# Patient Record
Sex: Male | Born: 1940 | Race: White | Hispanic: No | Smoking: Former smoker
Health system: Southern US, Community
[De-identification: ages and names within clinical notes are randomized; demographics above are authoritative.]

## PROBLEM LIST (undated history)

## (undated) DIAGNOSIS — I10 Essential (primary) hypertension: Secondary | ICD-10-CM

## (undated) DIAGNOSIS — I2699 Other pulmonary embolism without acute cor pulmonale: Secondary | ICD-10-CM

## (undated) DIAGNOSIS — G459 Transient cerebral ischemic attack, unspecified: Secondary | ICD-10-CM

## (undated) DIAGNOSIS — I82409 Acute embolism and thrombosis of unspecified deep veins of unspecified lower extremity: Secondary | ICD-10-CM

## (undated) DIAGNOSIS — N529 Male erectile dysfunction, unspecified: Secondary | ICD-10-CM

## (undated) HISTORY — PX: TONSILLECTOMY: SUR1361

## (undated) HISTORY — DX: Other pulmonary embolism without acute cor pulmonale: I26.99

## (undated) HISTORY — DX: Male erectile dysfunction, unspecified: N52.9

## (undated) HISTORY — PX: CATARACT EXTRACTION W/ INTRAOCULAR LENS  IMPLANT, BILATERAL: SHX1307

## (undated) HISTORY — PX: COLONOSCOPY: SHX174

## (undated) HISTORY — PX: APPENDECTOMY: SHX54

## (undated) HISTORY — DX: Essential (primary) hypertension: I10

---

## 2004-11-05 ENCOUNTER — Ambulatory Visit: Payer: Self-pay | Admitting: Family Medicine

## 2005-05-27 ENCOUNTER — Ambulatory Visit: Payer: Self-pay | Admitting: Family Medicine

## 2005-07-16 ENCOUNTER — Ambulatory Visit: Payer: Self-pay | Admitting: Family Medicine

## 2006-07-06 ENCOUNTER — Ambulatory Visit: Payer: Self-pay | Admitting: Family Medicine

## 2006-07-06 LAB — CONVERTED CEMR LAB
ALT: 23 units/L (ref 0–40)
AST: 21 units/L (ref 0–37)
Alkaline Phosphatase: 57 units/L (ref 39–117)
BUN: 16 mg/dL (ref 6–23)
Calcium: 9.8 mg/dL (ref 8.4–10.5)
HCT: 46 % (ref 39.0–52.0)
HDL: 57.3 mg/dL (ref >39.0)
Hemoglobin: 15.5 g/dL (ref 13.0–17.0)
MCHC: 33.6 g/dL (ref 30.0–36.0)
Monocytes Absolute: 0.9 10*3/uL — ABNORMAL HIGH (ref 0.2–0.7)
Neutro Abs: 4.8 10*3/uL (ref 1.4–7.7)
Platelets: 261 10*3/uL (ref 150–400)
RBC: 4.76 M/uL (ref 4.22–5.81)
Sodium: 143 meq/L (ref 135–145)
VLDL: 30 mg/dL (ref 0–40)
WBC: 7.8 10*3/uL (ref 4.5–10.5)

## 2006-11-18 ENCOUNTER — Ambulatory Visit: Payer: Self-pay | Admitting: Internal Medicine

## 2006-11-26 ENCOUNTER — Encounter: Payer: Self-pay | Admitting: Internal Medicine

## 2006-11-26 ENCOUNTER — Ambulatory Visit: Payer: Self-pay | Admitting: Internal Medicine

## 2006-12-09 ENCOUNTER — Ambulatory Visit: Payer: Self-pay | Admitting: Family Medicine

## 2008-03-16 ENCOUNTER — Telehealth: Payer: Self-pay | Admitting: Family Medicine

## 2008-03-23 ENCOUNTER — Encounter: Payer: Self-pay | Admitting: Family Medicine

## 2008-03-24 ENCOUNTER — Ambulatory Visit: Payer: Self-pay | Admitting: Family Medicine

## 2008-03-24 DIAGNOSIS — R5381 Other malaise: Secondary | ICD-10-CM | POA: Insufficient documentation

## 2008-03-24 DIAGNOSIS — F528 Other sexual dysfunction not due to a substance or known physiological condition: Secondary | ICD-10-CM | POA: Insufficient documentation

## 2008-03-24 DIAGNOSIS — R5383 Other fatigue: Secondary | ICD-10-CM

## 2008-03-24 DIAGNOSIS — I10 Essential (primary) hypertension: Secondary | ICD-10-CM | POA: Insufficient documentation

## 2008-03-24 LAB — CONVERTED CEMR LAB
AST: 21 units/L (ref 0–37)
Albumin: 4.2 g/dL (ref 3.5–5.2)
Alkaline Phosphatase: 62 units/L (ref 39–117)
BUN: 14 mg/dL (ref 6–23)
Bilirubin Urine: NEGATIVE
Blood in Urine, dipstick: NEGATIVE
CO2: 25 meq/L (ref 19–32)
Calcium: 9.3 mg/dL (ref 8.4–10.5)
Chloride: 114 meq/L — ABNORMAL HIGH (ref 96–112)
Cholesterol: 170 mg/dL (ref 0–200)
Creatinine, Ser: 1.1 mg/dL (ref 0.4–1.5)
GFR calc Af Amer: 86 mL/min
Glucose, Bld: 102 mg/dL — ABNORMAL HIGH (ref 70–99)
HDL: 41.8 mg/dL (ref >39.0)
Ketones, urine, test strip: NEGATIVE
MCHC: 34.3 g/dL (ref 30.0–36.0)
MCV: 97.4 fL (ref 78.0–100.0)
Neutrophils Relative %: 61.7 % (ref 43.0–77.0)
Platelets: 247 10*3/uL (ref 150–400)
Protein, U semiquant: NEGATIVE
RDW: 12.3 % (ref 11.5–14.6)
Specific Gravity, Urine: 1.015
Total Bilirubin: 1.7 mg/dL — ABNORMAL HIGH (ref 0.3–1.2)
Triglycerides: 82 mg/dL (ref 0–149)

## 2008-03-29 ENCOUNTER — Telehealth: Payer: Self-pay | Admitting: *Deleted

## 2008-03-30 ENCOUNTER — Ambulatory Visit: Payer: Self-pay | Admitting: Family Medicine

## 2008-05-18 ENCOUNTER — Telehealth: Payer: Self-pay | Admitting: *Deleted

## 2008-08-09 DIAGNOSIS — M25539 Pain in unspecified wrist: Secondary | ICD-10-CM | POA: Insufficient documentation

## 2008-08-10 ENCOUNTER — Ambulatory Visit: Payer: Self-pay | Admitting: Family Medicine

## 2008-11-30 ENCOUNTER — Telehealth: Payer: Self-pay | Admitting: Family Medicine

## 2009-01-22 ENCOUNTER — Ambulatory Visit: Payer: Self-pay | Admitting: Family Medicine

## 2009-01-22 LAB — CONVERTED CEMR LAB
Basophils Relative: 0.6 % (ref 0.0–3.0)
Bilirubin Urine: NEGATIVE
CO2: 30 meq/L (ref 19–32)
Calcium: 9.1 mg/dL (ref 8.4–10.5)
Chloride: 103 meq/L (ref 96–112)
GFR calc non Af Amer: 42.84 mL/min (ref >60)
Glucose, Urine, Semiquant: NEGATIVE
Hemoglobin: 15.6 g/dL (ref 13.0–17.0)
Lymphocytes Relative: 26.8 % (ref 12.0–46.0)
MCHC: 33.8 g/dL (ref 30.0–36.0)
Monocytes Absolute: 0.9 10*3/uL (ref 0.1–1.0)
Neutro Abs: 5.5 10*3/uL (ref 1.4–7.7)
Potassium: 4.7 meq/L (ref 3.5–5.1)
Protein, U semiquant: NEGATIVE
RBC: 4.77 M/uL (ref 4.22–5.81)
Urobilinogen, UA: 0.2
WBC Urine, dipstick: NEGATIVE
WBC: 9.3 10*3/uL (ref 4.5–10.5)

## 2009-01-23 ENCOUNTER — Telehealth: Payer: Self-pay | Admitting: Family Medicine

## 2009-02-13 ENCOUNTER — Ambulatory Visit: Payer: Self-pay | Admitting: Family Medicine

## 2009-02-14 LAB — CONVERTED CEMR LAB
ALT: 25 units/L (ref 0–53)
AST: 22 units/L (ref 0–37)
Albumin: 4.1 g/dL (ref 3.5–5.2)
Alkaline Phosphatase: 57 units/L (ref 39–117)
BUN: 15 mg/dL (ref 6–23)
Basophils Absolute: 0 10*3/uL (ref 0.0–0.1)
Chloride: 111 meq/L (ref 96–112)
Eosinophils Absolute: 0.2 10*3/uL (ref 0.0–0.7)
GFR calc non Af Amer: 79.01 mL/min (ref >60)
Glucose, Bld: 93 mg/dL (ref 70–99)
HCT: 43.6 % (ref 39.0–52.0)
Lymphocytes Relative: 27.6 % (ref 12.0–46.0)
Monocytes Relative: 9.5 % (ref 3.0–12.0)
Neutro Abs: 4.7 10*3/uL (ref 1.4–7.7)
Potassium: 4.4 meq/L (ref 3.5–5.1)
RDW: 12.3 % (ref 11.5–14.6)
Sodium: 144 meq/L (ref 135–145)
TSH: 2.52 microintl units/mL (ref 0.35–5.50)
Total Protein: 7.2 g/dL (ref 6.0–8.3)

## 2009-04-16 ENCOUNTER — Telehealth: Payer: Self-pay | Admitting: Family Medicine

## 2009-04-17 ENCOUNTER — Ambulatory Visit: Payer: Self-pay | Admitting: Family Medicine

## 2009-04-19 ENCOUNTER — Ambulatory Visit: Payer: Self-pay | Admitting: Family Medicine

## 2009-04-19 LAB — CONVERTED CEMR LAB
Alkaline Phosphatase: 56 units/L (ref 39–117)
Basophils Absolute: 0.1 10*3/uL (ref 0.0–0.1)
Basophils Relative: 0.7 % (ref 0.0–3.0)
Blood in Urine, dipstick: NEGATIVE
CO2: 28 meq/L (ref 19–32)
Eosinophils Relative: 2.6 % (ref 0.0–5.0)
Folate: 4.9 ng/mL
Glucose, Bld: 94 mg/dL (ref 70–99)
HDL: 53.3 mg/dL (ref >39.00)
Iron: 100 ug/dL (ref 42–165)
Ketones, urine, test strip: NEGATIVE
LDL Cholesterol: 110 mg/dL — ABNORMAL HIGH (ref 0–99)
Lymphocytes Relative: 27.8 % (ref 12.0–46.0)
Lymphs Abs: 2.4 10*3/uL (ref 0.7–4.0)
Monocytes Relative: 8.4 % (ref 3.0–12.0)
Neutrophils Relative %: 60.5 % (ref 43.0–77.0)
PSA: 1.2 ng/mL (ref 0.10–4.00)
Platelets: 240 10*3/uL (ref 150.0–400.0)
Potassium: 5 meq/L (ref 3.5–5.1)
RDW: 12.7 % (ref 11.5–14.6)
Saturation Ratios: 29.5 % (ref 20.0–50.0)
Sodium: 145 meq/L (ref 135–145)
Total CHOL/HDL Ratio: 3
pH: 5.5

## 2009-05-03 ENCOUNTER — Telehealth: Payer: Self-pay | Admitting: Family Medicine

## 2009-05-07 ENCOUNTER — Ambulatory Visit: Payer: Self-pay | Admitting: Family Medicine

## 2009-05-07 LAB — CONVERTED CEMR LAB
Sex Hormone Binding: 31 nmol/L (ref 13–71)
Testosterone Free: 43.2 pg/mL — ABNORMAL LOW (ref 47.0–244.0)
Testosterone-% Free: 2 % (ref 1.6–2.9)
Testosterone: 217.71 ng/dL — ABNORMAL LOW (ref 350–890)

## 2009-05-08 ENCOUNTER — Ambulatory Visit: Payer: Self-pay | Admitting: Family Medicine

## 2009-05-08 DIAGNOSIS — IMO0002 Reserved for concepts with insufficient information to code with codable children: Secondary | ICD-10-CM | POA: Insufficient documentation

## 2009-05-08 DIAGNOSIS — E291 Testicular hypofunction: Secondary | ICD-10-CM | POA: Insufficient documentation

## 2009-06-29 ENCOUNTER — Telehealth: Payer: Self-pay | Admitting: Family Medicine

## 2009-06-30 ENCOUNTER — Telehealth: Payer: Self-pay | Admitting: Family Medicine

## 2009-06-30 ENCOUNTER — Inpatient Hospital Stay (HOSPITAL_COMMUNITY): Admission: EM | Admit: 2009-06-30 | Discharge: 2009-07-01 | Payer: Self-pay | Admitting: Emergency Medicine

## 2009-07-19 ENCOUNTER — Telehealth: Payer: Self-pay | Admitting: Family Medicine

## 2009-09-03 ENCOUNTER — Telehealth: Payer: Self-pay | Admitting: Family Medicine

## 2009-09-03 DIAGNOSIS — M549 Dorsalgia, unspecified: Secondary | ICD-10-CM | POA: Insufficient documentation

## 2009-09-05 ENCOUNTER — Telehealth: Payer: Self-pay | Admitting: Family Medicine

## 2009-11-20 ENCOUNTER — Encounter (INDEPENDENT_AMBULATORY_CARE_PROVIDER_SITE_OTHER): Payer: Self-pay | Admitting: *Deleted

## 2010-08-01 NOTE — Progress Notes (Signed)
Summary: benicar refill  Phone Note Refill Request Message from:  Fax from Pharmacy on July 19, 2009 3:00 PM  Refills Requested: Medication #1:  BENICAR 20 MG TABS take half tab once daily Initial call taken by: Kern Reap CMA (AAMA),  July 19, 2009 3:00 PM    Prescriptions: BENICAR 20 MG TABS (OLMESARTAN MEDOXOMIL) take half tab once daily  #100 Tablet x 3   Entered by:   Kern Reap CMA (AAMA)   Authorized by:   Roderick Pee MD   Signed by:   Kern Reap CMA (AAMA) on 07/19/2009   Method used:   Electronically to        [id number] (retail)       [address]       [place], [place]  [address]       Ph: [phone]       Fax: [phone]   RxID:   [id number]

## 2010-08-01 NOTE — Progress Notes (Signed)
  Phone Note Call from Patient   Caller: Jeannette How (fiance') Summary of Call: Patient admitted last night with reported Xanax overdose.    Per fiance he took 8 or 9 xanax.  No clear suicidal intent and he is waking up today and doing better.  She states plans are to tranfer him to behavioral health for further evaluation.  She was requesting if Dr Tawanna Cooler, his primary, could call or stop by this weekend and I explained it is not routine to call physicians not on call at home but that I would be sure to send him phone note to update. Initial call taken by: Evelena Peat MD,  June 30, 2009 1:59 PM

## 2010-08-01 NOTE — Letter (Signed)
Summary: Colonoscopy Letter  [facility]  [address] [place], [place] [address]   Phone: [phone]  Fax: [phone]      Nov 20, 2009 MRN: [id number]   Timothy Duncan [address] [place], [place]  [address]   Dear Timothy Duncan,   According to your medical record, it is time for you to schedule a Colonoscopy. The American Cancer Society recommends this procedure as a method to detect early colon cancer. Patients with a family history of colon cancer, or a personal history of colon polyps or inflammatory bowel disease are at increased risk.  This letter has beeen generated based on the recommendations made at the time of your procedure. If you feel that in your particular situation this may no longer apply, please contact our office.  Please call our office at [phone] to schedule this appointment or to update your records at your earliest convenience.  Thank you for cooperating with [place] to provide you with the very best care possible.   Sincerely,  Timothy Duncan, M.D.  [facility] [phone]

## 2010-08-01 NOTE — Progress Notes (Signed)
Summary: referral  Phone Note Call from Patient Call back at Home Phone [phone]   Caller: Patient Call For: Roderick Pee MD Summary of Call: wants to know what's happening with his referral to Dr Yetta Barre- pls call him at home. Initial call taken by: Raechel Ache, RN,  September 05, 2009 9:59 AM  Follow-up for Phone Call        Informed pt of process of referral to Neurosurgeon. Follow-up by: Corky Mull,  September 05, 2009 10:41 AM

## 2010-08-01 NOTE — Progress Notes (Signed)
Summary: Pt req referral to see Dr. Marikay Alar at [facility]   Phone Note Call from Patient Call back at [phone] cell   Caller: Patient Summary of Call: Pt called and is req to get a referral to see Dr. Marikay Alar at [facility] [phone]. Pt says that his L-4 has gone out on his back and has had MRI done. Pt is req this appt to see Dr Yetta Barre either today or tomorrow. Pt is on morphin for pain.  Initial call taken by: Lucy Antigua,  September 03, 2009 10:04 AM  Follow-up for Phone Call        [place]......... please call, who put him on morphine for pain??????? Follow-up by: Roderick Pee MD,  September 03, 2009 10:14 AM  Additional Follow-up for Phone Call Additional follow up Details #1::        dr Ethelene Hal gave the rx until he can give a cortizone shot on wednesday.  is it okay to give referral? Additional Follow-up by: Kern Reap CMA Duncan Dull),  September 03, 2009 2:19 PM  New Problems: BACK PAIN (ICD-724.5)   Additional Follow-up for Phone Call Additional follow up Details #2::    okay to  referred to neurosurgeon.........Marland Kitchenbut I don't have the details of this problem since he seen Dr. Ethelene Hal.....Marland Kitchen it might be better if Dr. Algis Greenhouse ireferred him Follow-up by: Roderick Pee MD,  September 03, 2009 4:30 PM  Additional Follow-up for Phone Call Additional follow up Details #3:: Details for Additional Follow-up Action Taken: phone call completed  Additional Follow-up by: Kern Reap CMA Duncan Dull),  September 04, 2009 10:04 AM  New Problems: BACK PAIN (ICD-724.5) Prescriptions: VIAGRA 50 MG  TABS (SILDENAFIL CITRATE) uad as needed ;  cpx when due  #6 x 11   Entered by:   Kern Reap CMA (AAMA)   Authorized by:   Roderick Pee MD   Signed by:   Kern Reap CMA (AAMA) on 09/04/2009   Method used:   Electronically to        [id number] (retail)       [address]       [place], [place]  [address]       Ph: [phone]       Fax:  [phone]   RxID:   [id number]

## 2010-08-31 ENCOUNTER — Other Ambulatory Visit: Payer: Self-pay | Admitting: Family Medicine

## 2010-08-31 DIAGNOSIS — I10 Essential (primary) hypertension: Secondary | ICD-10-CM

## 2010-09-15 LAB — COMPREHENSIVE METABOLIC PANEL
ALT: 26 U/L (ref 0–53)
Albumin: 3.5 g/dL (ref 3.5–5.2)
Alkaline Phosphatase: 44 U/L (ref 39–117)
Calcium: 8.6 mg/dL (ref 8.4–10.5)
Chloride: 109 mEq/L (ref 96–112)
GFR calc Af Amer: >60 mL/min (ref >60)
Glucose, Bld: 96 mg/dL (ref 70–99)
Sodium: 141 mEq/L (ref 135–145)
Total Bilirubin: 1.8 mg/dL — ABNORMAL HIGH (ref 0.3–1.2)

## 2010-09-15 LAB — URINALYSIS, ROUTINE W REFLEX MICROSCOPIC
Bilirubin Urine: NEGATIVE
Glucose, UA: NEGATIVE mg/dL
Hgb urine dipstick: NEGATIVE
Ketones, ur: NEGATIVE mg/dL
Nitrite: NEGATIVE
Protein, ur: NEGATIVE mg/dL
Specific Gravity, Urine: 1.02 (ref 1.005–1.030)
Urobilinogen, UA: 0.2 mg/dL (ref 0.0–1.0)
pH: 5.5 (ref 5.0–8.0)

## 2010-09-15 LAB — BASIC METABOLIC PANEL
Calcium: 8.9 mg/dL (ref 8.4–10.5)
Glucose, Bld: 96 mg/dL (ref 70–99)
Sodium: 143 mEq/L (ref 135–145)

## 2010-09-15 LAB — CBC
HCT: 41 % (ref 39.0–52.0)
HCT: 41.5 % (ref 39.0–52.0)
Hemoglobin: 13.6 g/dL (ref 13.0–17.0)
MCHC: 33 g/dL (ref 30.0–36.0)
MCHC: 33.1 g/dL (ref 30.0–36.0)
MCV: 97.1 fL (ref 78.0–100.0)
MCV: 97.9 fL (ref 78.0–100.0)
Platelets: 190 10*3/uL (ref 150–400)
RBC: 4.19 MIL/uL — ABNORMAL LOW (ref 4.22–5.81)
RBC: 4.28 MIL/uL (ref 4.22–5.81)

## 2010-09-15 LAB — DIFFERENTIAL
Basophils Absolute: 0.1 10*3/uL (ref 0.0–0.1)
Basophils Relative: 2 % — ABNORMAL HIGH (ref 0–1)
Lymphocytes Relative: 26 % (ref 12–46)
Monocytes Absolute: 0.5 10*3/uL (ref 0.1–1.0)
Neutro Abs: 4.7 10*3/uL (ref 1.7–7.7)

## 2010-09-15 LAB — CK TOTAL AND CKMB (NOT AT ARMC)
CK, MB: 1.9 ng/mL (ref 0.3–4.0)
Relative Index: INVALID (ref 0.0–2.5)
Total CK: 87 U/L (ref 7–232)

## 2010-09-15 LAB — TSH: TSH: 1.613 u[IU]/mL (ref 0.350–4.500)

## 2010-09-30 LAB — CBC
HCT: 43.7 % (ref 39.0–52.0)
Hemoglobin: 14.5 g/dL (ref 13.0–17.0)
MCHC: 33.1 g/dL (ref 30.0–36.0)
RBC: 4.47 MIL/uL (ref 4.22–5.81)
RDW: 13.9 % (ref 11.5–15.5)

## 2010-09-30 LAB — PROTIME-INR: Prothrombin Time: 12.5 seconds (ref 11.6–15.2)

## 2010-09-30 LAB — HEPATIC FUNCTION PANEL
ALT: 29 U/L (ref 0–53)
AST: 23 U/L (ref 0–37)
Albumin: 3.9 g/dL (ref 3.5–5.2)
Alkaline Phosphatase: 50 U/L (ref 39–117)
Bilirubin, Direct: 0.3 mg/dL (ref 0.0–0.3)
Indirect Bilirubin: 1 mg/dL — ABNORMAL HIGH (ref 0.3–0.9)
Total Bilirubin: 1.3 mg/dL — ABNORMAL HIGH (ref 0.3–1.2)
Total Protein: 6.9 g/dL (ref 6.0–8.3)

## 2010-09-30 LAB — RAPID URINE DRUG SCREEN, HOSP PERFORMED
Amphetamines: NOT DETECTED
Benzodiazepines: POSITIVE — AB
Cocaine: NOT DETECTED
Opiates: NOT DETECTED

## 2010-09-30 LAB — BASIC METABOLIC PANEL
CO2: 30 mEq/L (ref 19–32)
Calcium: 9.2 mg/dL (ref 8.4–10.5)
Chloride: 109 mEq/L (ref 96–112)
GFR calc Af Amer: >60 mL/min (ref >60)
Glucose, Bld: 94 mg/dL (ref 70–99)
Potassium: 3.8 mEq/L (ref 3.5–5.1)
Sodium: 142 mEq/L (ref 135–145)

## 2010-09-30 LAB — DIFFERENTIAL
Basophils Absolute: 0 10*3/uL (ref 0.0–0.1)
Basophils Relative: 1 % (ref 0–1)
Eosinophils Relative: 4 % (ref 0–5)
Monocytes Absolute: 0.8 10*3/uL (ref 0.1–1.0)
Monocytes Relative: 9 % (ref 3–12)
Neutro Abs: 5.7 10*3/uL (ref 1.7–7.7)

## 2010-11-04 ENCOUNTER — Other Ambulatory Visit: Payer: Self-pay | Admitting: Family Medicine

## 2011-01-20 ENCOUNTER — Encounter: Payer: Self-pay | Admitting: Family Medicine

## 2011-01-21 ENCOUNTER — Ambulatory Visit (INDEPENDENT_AMBULATORY_CARE_PROVIDER_SITE_OTHER): Payer: Medicare Other | Admitting: Family Medicine

## 2011-01-21 ENCOUNTER — Encounter: Payer: Self-pay | Admitting: Family Medicine

## 2011-01-21 DIAGNOSIS — F528 Other sexual dysfunction not due to a substance or known physiological condition: Secondary | ICD-10-CM

## 2011-01-21 DIAGNOSIS — E291 Testicular hypofunction: Secondary | ICD-10-CM

## 2011-01-21 DIAGNOSIS — Z125 Encounter for screening for malignant neoplasm of prostate: Secondary | ICD-10-CM

## 2011-01-21 DIAGNOSIS — I10 Essential (primary) hypertension: Secondary | ICD-10-CM

## 2011-01-21 LAB — LIPID PANEL
Cholesterol: 152 mg/dL (ref 0–200)
LDL Cholesterol: 88 mg/dL (ref 0–99)
Total CHOL/HDL Ratio: 3
VLDL: 15.4 mg/dL (ref 0.0–40.0)

## 2011-01-21 LAB — BASIC METABOLIC PANEL
BUN: 12 mg/dL (ref 6–23)
Calcium: 9.4 mg/dL (ref 8.4–10.5)
GFR: 75.08 mL/min (ref >60.00)
Glucose, Bld: 94 mg/dL (ref 70–99)

## 2011-01-21 LAB — CBC WITH DIFFERENTIAL/PLATELET
Basophils Absolute: 0 10*3/uL (ref 0.0–0.1)
Eosinophils Relative: 1.6 % (ref 0.0–5.0)
Lymphocytes Relative: 27.7 % (ref 12.0–46.0)
Monocytes Relative: 8.2 % (ref 3.0–12.0)
Neutrophils Relative %: 62.2 % (ref 43.0–77.0)
Platelets: 247 10*3/uL (ref 150.0–400.0)
RDW: 13.7 % (ref 11.5–14.6)
WBC: 8.3 10*3/uL (ref 4.5–10.5)

## 2011-01-21 LAB — POCT URINALYSIS DIPSTICK
Nitrite, UA: NEGATIVE
Protein, UA: NEGATIVE
Spec Grav, UA: 1.015
Urobilinogen, UA: 0.2

## 2011-01-21 LAB — PSA: PSA: 1.03 ng/mL (ref 0.10–4.00)

## 2011-01-21 LAB — HEPATIC FUNCTION PANEL
ALT: 25 U/L (ref 0–53)
AST: 21 U/L (ref 0–37)
Alkaline Phosphatase: 66 U/L (ref 39–117)
Bilirubin, Direct: 0.2 mg/dL (ref 0.0–0.3)
Total Bilirubin: 1.2 mg/dL (ref 0.3–1.2)

## 2011-01-21 MED ORDER — SILDENAFIL CITRATE 100 MG PO TABS: 100.0000 mg | ORAL_TABLET | ORAL | Status: DC | PRN

## 2011-01-21 MED ORDER — LOSARTAN POTASSIUM-HCTZ 50-12.5 MG PO TABS: ORAL_TABLET | ORAL | Status: DC

## 2011-01-21 NOTE — Patient Instructions (Signed)
Stop the Benicar, began the Hyzaar one half tab q.a.m.  Check y  blood pressure daily at home.  Return sometime in the next two to 3 weeks for a 30 minute appointment for mole removal.  When you return to bring a record of all your blood pressure readings.  We will call you in a couple days when  we get all your lab work back.  Let's increase the  V to 100  milligrams to see if this not help better than the 50

## 2011-01-21 NOTE — Progress Notes (Signed)
  Subjective:    Patient ID: Timothy Duncan, male    DOB: 15-Nov-1940, 70 y.o.   MRN: [id number]  HPI Timothy Duncan is a 70 year old, divorced male, nonsmoker, who comes in today for a Medicare wellness examination because of a history of hypertension erectile dysfunction and decreased libido.  His hypertension is going to have Benicar 20 mg daily.  BP 110/80.  Talk about switching to Cozaar because of cost.  He does notice decreased libido, and tobacco doesn't seem to working.  We discussed checking a testosterone level.  He gets routine eye care, dental care, colonoscopy, normal, hearing normal, functions independently.  He still works full-time as an Airline pilot, he exercises on a regular basis mostly playing golf two to 3 days per week, cognitive function, normal, home safety reviewed.  No issues identified, no guns in the house, he does have a health care power-of-attorney, and a living will.  Tetanus 2010, Pneumovax 2008, shingles 2009.   Review of Systems  Constitutional: Negative.   HENT: Negative.   Eyes: Negative.   Respiratory: Negative.   Cardiovascular: Negative.   Gastrointestinal: Negative.   Genitourinary: Positive for decreased urine volume.  Musculoskeletal: Negative.   Skin: Negative.   Neurological: Negative.   Hematological: Negative.   Psychiatric/Behavioral: Negative.        Objective:   Physical Exam  Constitutional: He is oriented to person, place, and time. He appears well-developed and well-nourished.  HENT:  Head: Normocephalic and atraumatic.  Right Ear: External ear normal.  Left Ear: External ear normal.  Nose: Nose normal.  Mouth/Throat: Oropharynx is clear and moist.  Eyes: Conjunctivae and EOM are normal. Pupils are equal, round, and reactive to light.  Neck: Normal range of motion. Neck supple. No JVD present. No tracheal deviation present. No thyromegaly present.  Cardiovascular: Normal rate, regular rhythm, normal heart sounds and intact distal pulses.   Exam reveals no gallop and no friction rub.   No murmur heard. Pulmonary/Chest: Effort normal and breath sounds normal. No stridor. No respiratory distress. He has no wheezes. He has no rales. He exhibits no tenderness.  Abdominal: Soft. Bowel sounds are normal. He exhibits no distension and no mass. There is no tenderness. There is no rebound and no guarding.  Genitourinary: Rectum normal, prostate normal and penis normal. Guaiac negative stool. No penile tenderness.  Musculoskeletal: Normal range of motion. He exhibits no edema and no tenderness.  Lymphadenopathy:    He has no cervical adenopathy.  Neurological: He is alert and oriented to person, place, and time. He has normal reflexes. No cranial nerve deficit. He exhibits normal muscle tone.  Skin: Skin is warm and dry. No rash noted. No erythema. No pallor.       Two abnormal lesions on the lower extremities.  Advised to come back for removal  Psychiatric: He has a normal mood and affect. His behavior is normal. Judgment and thought content normal.          Assessment & Plan:  Healthy male.  Hypertension.  Switch from Benicar to Cozaar.  Decreased libido.  Check testosterone level.  Abnormal moles.  Lower extremity.  Advised.  Return for removal

## 2011-01-22 ENCOUNTER — Telehealth: Payer: Self-pay | Admitting: *Deleted

## 2011-01-22 MED ORDER — TESTOSTERONE 5 MG/24HR TD PT24: 1.0000 | MEDICATED_PATCH | Freq: Every day | TRANSDERMAL | Status: DC

## 2011-01-22 NOTE — Telephone Encounter (Signed)
Refill sent.

## 2011-01-23 ENCOUNTER — Telehealth: Payer: Self-pay | Admitting: Family Medicine

## 2011-01-23 MED ORDER — TESTOSTERONE 4 MG/24HR TD PT24: 1.0000 | MEDICATED_PATCH | Freq: Every day | TRANSDERMAL | Status: DC

## 2011-01-23 NOTE — Telephone Encounter (Signed)
Pharmacist called and said that testosterone (ANDRODERM) 5 MG/24HR is no longer avail. 4 mg is avail. Is this ok to change?

## 2011-01-23 NOTE — Telephone Encounter (Signed)
Ok 4 mg

## 2011-01-23 NOTE — Telephone Encounter (Signed)
rx called in

## 2011-01-29 ENCOUNTER — Other Ambulatory Visit: Payer: Self-pay | Admitting: *Deleted

## 2011-01-29 ENCOUNTER — Telehealth: Payer: Self-pay | Admitting: Family Medicine

## 2011-01-29 MED ORDER — OLMESARTAN MEDOXOMIL 20 MG PO TABS: 20.0000 mg | ORAL_TABLET | Freq: Every day | ORAL | Status: DC

## 2011-01-29 NOTE — Telephone Encounter (Signed)
Pt called and said that the new bp med, losartan-hydrochlorothiazide (HYZAAR) 50-12.5 MG , is making patient feel lethargic. Pt very fatigued. Pt does not want this med. Pt req to go back on Benicar. [facility] to [facility] [address].

## 2011-01-29 NOTE — Telephone Encounter (Signed)
ok 

## 2011-02-04 ENCOUNTER — Telehealth: Payer: Self-pay | Admitting: *Deleted

## 2011-02-04 ENCOUNTER — Ambulatory Visit (INDEPENDENT_AMBULATORY_CARE_PROVIDER_SITE_OTHER): Payer: Medicare Other | Admitting: Family Medicine

## 2011-02-04 ENCOUNTER — Encounter: Payer: Self-pay | Admitting: Family Medicine

## 2011-02-04 DIAGNOSIS — L570 Actinic keratosis: Secondary | ICD-10-CM

## 2011-02-04 DIAGNOSIS — L989 Disorder of the skin and subcutaneous tissue, unspecified: Secondary | ICD-10-CM

## 2011-02-04 NOTE — Telephone Encounter (Signed)
Patient had a colonoscopy in 11/26/2006 showing diverticulosis and serrated adenomatous colon polyps. Patient is overdue for recall colonoscopy (3 year recall). I have called and advised patient of this. However, he states that he had a colonoscopy completed 1-2 years ago. When asked where, he stated that [facility] completed it. I have explained that the last procedure we have on file is from 2008. Patient states "well, you cant convince me to have another one." "I saw Dr Tawanna Cooler not long ago and he told me that I was okay for several more years on my colonoscopy."

## 2011-02-04 NOTE — Progress Notes (Signed)
  Subjective:    Patient ID: Timothy Duncan, male    DOB: 1941/04/05, 70 y.o.   MRN: [id number]  HPI [name]  is a 70 year old, married male, nonsmoker Who comes in today for removal of two lesions.  Lesion number one right posterior calf 10 mm x 10 mm  Lesion number two left lateral lower extremity 10 mm times 10 mm  After informed consent, the lesions were anesthetized with Xylocaine and epinephrine.  The lesions were excised with 2-mm margins.  They were sent for pathologic analysis.  The bases were cauterized.  Band-Aids were applied.  The patient tolerated the procedure well.  No complications.  Clinical diagnosis severe dictated actinic keratoses   Review of Systems General and dermatologic review of systems otherwise negative   Objective:   Physical Exam  Procedure see above      Assessment & Plan:  Actinic keratosis by clinical exam x 2 removed Path pending

## 2011-04-23 ENCOUNTER — Encounter: Payer: Self-pay | Admitting: Family Medicine

## 2011-04-23 ENCOUNTER — Ambulatory Visit (INDEPENDENT_AMBULATORY_CARE_PROVIDER_SITE_OTHER): Payer: Medicare Other | Admitting: Family Medicine

## 2011-04-23 VITALS — BP 140/80 | Temp 98.3°F | Wt 264.0 lb

## 2011-04-23 DIAGNOSIS — D2370 Other benign neoplasm of skin of unspecified lower limb, including hip: Secondary | ICD-10-CM

## 2011-04-23 DIAGNOSIS — D2270 Melanocytic nevi of unspecified lower limb, including hip: Secondary | ICD-10-CM | POA: Insufficient documentation

## 2011-04-23 DIAGNOSIS — Z23 Encounter for immunization: Secondary | ICD-10-CM

## 2011-04-23 NOTE — Progress Notes (Signed)
  Subjective:    Patient ID: Timothy Duncan, male    DOB: 1941-05-23, 70 y.o.   MRN: [id number]  HPI Timothy Duncan is a 70 -year-old male, who comes in today for removal of inflamed nevus on the inner side of his left thigh.   He has had a lesion there for many years, recently, it's become red and inflamed.  After informed consent, he was taken to the treatment room, and 1% Xylocaine with epinephrine was used for anesthesia.  The lesion was removed.  The base was cauterized.  Band-Aid was applied.  He tolerated the procedure well.  No complications Lesion was 6 mm x 6 mm    Review of Systems General and dermatologic review of systems otherwise negative    Objective:   Physical Exam Procedure see above       Assessment & Plan:  Inflamed nevus removed

## 2011-04-23 NOTE — Patient Instructions (Signed)
Remove the Band-Aid tomorrow.  Return p.r.n.

## 2011-06-10 ENCOUNTER — Telehealth: Payer: Self-pay | Admitting: Family Medicine

## 2011-06-10 NOTE — Telephone Encounter (Signed)
Pt experiencing flu like symptoms Fever, body aches, cough,fatigue. Pt requesting for an rx to be called in for tamiflu  [facility] [address] Please contact pt

## 2011-06-10 NOTE — Telephone Encounter (Signed)
Informed patient he will need an office visit.  Patient will call back tomorrow if no improvement

## 2011-06-10 NOTE — Telephone Encounter (Signed)
Pt called back and said that he is needing tamiflu called in today to [facility] [address]. Pt says that he needs this taken care of today, preferably within the next 45 min.

## 2011-06-11 ENCOUNTER — Encounter: Payer: Self-pay | Admitting: Family Medicine

## 2011-06-11 ENCOUNTER — Ambulatory Visit (INDEPENDENT_AMBULATORY_CARE_PROVIDER_SITE_OTHER): Payer: Medicare Other | Admitting: Family Medicine

## 2011-06-11 DIAGNOSIS — J111 Influenza due to unidentified influenza virus with other respiratory manifestations: Secondary | ICD-10-CM

## 2011-06-11 MED ORDER — PREDNISONE 20 MG PO TABS: ORAL_TABLET | ORAL | Status: DC

## 2011-06-11 MED ORDER — HYDROCODONE-HOMATROPINE 5-1.5 MG/5ML PO SYRP: ORAL_SOLUTION | ORAL | Status: DC

## 2011-06-11 NOTE — Progress Notes (Signed)
  Subjective:    Patient ID: Timothy Duncan, male    DOB: Aug 10, 1940, 70 y.o.   MRN: [id number]  HPI Timothy Duncan is a 70 year old male, who comes in today for evaluation of the flu.  He states he felt well until Monday when he developed fever, chills, and cough, slight sore throat.  Body temperature now normal.  No earache.  He does have a sense of wheezing.  No history of previous lung disease.  However, he did smoke in the past   Review of Systems General pulmonary is systems otherwise negative   Objective:   Physical Exam  Well-developed well-nourished man in no acute distress.  Examination of the HEENT were negative.  Neck was supple.  No adenopathy.  Lungs show symmetrical.  Breath sounds and very mild late expiratory wheezing     Assessment & Plan:  Influenza with wheezing.  Plan begin prednisone burst and taper. Hydromet

## 2011-06-11 NOTE — Patient Instructions (Signed)
Rest at home,  Tylenol or aspirin as needed for fever and chills.  Drink lots of liquids.  Run a vaporizer in your bedroom.  Hydromet one half to 1 teaspoon 3 to 4 times daily as needed for cough.  Prednisone two tabs x 3 days with a taper.  Return p.r.n.

## 2012-02-16 ENCOUNTER — Other Ambulatory Visit: Payer: Self-pay | Admitting: Family Medicine

## 2012-11-01 ENCOUNTER — Other Ambulatory Visit: Payer: Self-pay | Admitting: Dermatology

## 2012-12-08 ENCOUNTER — Other Ambulatory Visit: Payer: Self-pay | Admitting: Family Medicine

## 2013-01-04 ENCOUNTER — Encounter: Payer: Medicare Other | Admitting: Family Medicine

## 2013-01-05 ENCOUNTER — Ambulatory Visit (INDEPENDENT_AMBULATORY_CARE_PROVIDER_SITE_OTHER): Payer: Medicare Other | Admitting: Family Medicine

## 2013-01-05 ENCOUNTER — Encounter: Payer: Self-pay | Admitting: Family Medicine

## 2013-01-05 VITALS — BP 128/80 | Temp 98.3°F | Ht 69.0 in | Wt 253.0 lb

## 2013-01-05 DIAGNOSIS — N400 Enlarged prostate without lower urinary tract symptoms: Secondary | ICD-10-CM

## 2013-01-05 DIAGNOSIS — L570 Actinic keratosis: Secondary | ICD-10-CM

## 2013-01-05 DIAGNOSIS — I10 Essential (primary) hypertension: Secondary | ICD-10-CM

## 2013-01-05 DIAGNOSIS — Z0000 Encounter for general adult medical examination without abnormal findings: Secondary | ICD-10-CM

## 2013-01-05 DIAGNOSIS — M549 Dorsalgia, unspecified: Secondary | ICD-10-CM

## 2013-01-05 DIAGNOSIS — E291 Testicular hypofunction: Secondary | ICD-10-CM

## 2013-01-05 DIAGNOSIS — F528 Other sexual dysfunction not due to a substance or known physiological condition: Secondary | ICD-10-CM

## 2013-01-05 LAB — CBC WITH DIFFERENTIAL/PLATELET
Basophils Relative: 0.3 % (ref 0.0–3.0)
Hemoglobin: 15.8 g/dL (ref 13.0–17.0)
Lymphocytes Relative: 29.6 % (ref 12.0–46.0)
MCHC: 33.7 g/dL (ref 30.0–36.0)
Monocytes Relative: 8.5 % (ref 3.0–12.0)
Neutro Abs: 5 10*3/uL (ref 1.4–7.7)
RBC: 5.02 Mil/uL (ref 4.22–5.81)

## 2013-01-05 LAB — LIPID PANEL
LDL Cholesterol: 100 mg/dL — ABNORMAL HIGH (ref 0–99)
Total CHOL/HDL Ratio: 4
Triglycerides: 112 mg/dL (ref 0.0–149.0)

## 2013-01-05 LAB — POCT URINALYSIS DIPSTICK
Bilirubin, UA: NEGATIVE
Glucose, UA: NEGATIVE
Ketones, UA: NEGATIVE
Spec Grav, UA: 1.015

## 2013-01-05 LAB — HEPATIC FUNCTION PANEL
Albumin: 4.4 g/dL (ref 3.5–5.2)
Alkaline Phosphatase: 67 U/L (ref 39–117)
Total Bilirubin: 1.5 mg/dL — ABNORMAL HIGH (ref 0.3–1.2)

## 2013-01-05 LAB — BASIC METABOLIC PANEL
CO2: 29 mEq/L (ref 19–32)
GFR: 69.98 mL/min (ref >60.00)
Glucose, Bld: 87 mg/dL (ref 70–99)
Potassium: 4.7 mEq/L (ref 3.5–5.1)
Sodium: 142 mEq/L (ref 135–145)

## 2013-01-05 LAB — PSA: PSA: 0.91 ng/mL (ref 0.10–4.00)

## 2013-01-05 MED ORDER — SILDENAFIL CITRATE 100 MG PO TABS: 100.0000 mg | ORAL_TABLET | ORAL | Status: DC | PRN

## 2013-01-05 MED ORDER — LOSARTAN POTASSIUM-HCTZ 50-12.5 MG PO TABS
ORAL_TABLET | ORAL | Status: DC
Start: 2013-01-05 — End: 2014-03-08

## 2013-01-05 NOTE — Patient Instructions (Signed)
Continue the Hyzaar one half tablet daily and an aspirin tablet  Viagra when necessary  Return in one year sooner if any problems

## 2013-01-05 NOTE — Progress Notes (Signed)
  Subjective:    Patient ID: Timothy Duncan, male    DOB: May 18, 1941, 72 y.o.   MRN: [id number]  HPI Timothy Duncan is a 72 year old male divorced nonsmoker,,,,,,,, accountant by trade,,,,,,, who comes in today for a Medicare wellness examination because of a history of hypertension  He takes Hyzaar 50-12.5,,,,,,,,,, one half tab every morning BP 120/80  He also takes an aspirin tablet and Viagra when necessary for ED  He gets routine eye care, dental care, colonoscopy and GI, vaccinations up-to-date  Cognitive function normal he walks on a regular basis and plays golf home health safety reviewed no issues identified, no guns in the house, he does have a health care power of attorney and living well   Review of Systems  Constitutional: Negative.   HENT: Negative.   Eyes: Negative.   Respiratory: Negative.   Cardiovascular: Negative.   Gastrointestinal: Negative.   Genitourinary: Negative.   Musculoskeletal: Negative.   Skin: Negative.   Neurological: Negative.   Psychiatric/Behavioral: Negative.        Objective:   Physical Exam  Nursing note and vitals reviewed. Constitutional: He is oriented to person, place, and time. He appears well-developed and well-nourished.  HENT:  Head: Normocephalic and atraumatic.  Right Ear: External ear normal.  Left Ear: External ear normal.  Nose: Nose normal.  Mouth/Throat: Oropharynx is clear and moist.  Eyes: Conjunctivae and EOM are normal. Pupils are equal, round, and reactive to light.  Neck: Normal range of motion. Neck supple. No JVD present. No tracheal deviation present. No thyromegaly present.  Cardiovascular: Normal rate, regular rhythm, normal heart sounds and intact distal pulses.  Exam reveals no gallop and no friction rub.   No murmur heard. No carotid artery bruits peripheral pulses 2+ out of 2 and symmetrical  Pulmonary/Chest: Effort normal and breath sounds normal. No stridor. No respiratory distress. He has no wheezes. He has no  rales. He exhibits no tenderness.  Abdominal: Soft. Bowel sounds are normal. He exhibits no distension and no mass. There is no tenderness. There is no rebound and no guarding.  Genitourinary: Rectum normal and penis normal. Guaiac negative stool. No penile tenderness.  2+ symmetrical BPH  Musculoskeletal: Normal range of motion. He exhibits no edema and no tenderness.  Lymphadenopathy:    He has no cervical adenopathy.  Neurological: He is alert and oriented to person, place, and time. He has normal reflexes. No cranial nerve deficit. He exhibits normal muscle tone.  Skin: Skin is warm and dry. No rash noted. No erythema. No pallor.  Total body skin exam normal except a red lesion on his right clavicle advised to return for removal  Psychiatric: He has a normal mood and affect. His behavior is normal. Judgment and thought content normal.          Assessment & Plan:  Healthy male  Hypertension continue Hyzaar,,,,,, one half tab daily and an aspirin tablet  Erectile dysfunction Viagra when necessary

## 2013-05-05 ENCOUNTER — Other Ambulatory Visit: Payer: Self-pay

## 2014-03-08 ENCOUNTER — Other Ambulatory Visit: Payer: Self-pay | Admitting: Family Medicine

## 2014-07-31 ENCOUNTER — Other Ambulatory Visit: Payer: Self-pay | Admitting: Family Medicine

## 2014-07-31 ENCOUNTER — Telehealth: Payer: Self-pay | Admitting: Family Medicine

## 2014-07-31 NOTE — Telephone Encounter (Signed)
Error/njr °

## 2014-08-01 ENCOUNTER — Ambulatory Visit (INDEPENDENT_AMBULATORY_CARE_PROVIDER_SITE_OTHER): Payer: Medicare HMO | Admitting: Family Medicine

## 2014-08-01 ENCOUNTER — Encounter: Payer: Self-pay | Admitting: Family Medicine

## 2014-08-01 VITALS — BP 120/78 | Temp 98.0°F | Wt 270.0 lb

## 2014-08-01 DIAGNOSIS — L570 Actinic keratosis: Secondary | ICD-10-CM

## 2014-08-01 DIAGNOSIS — N528 Other male erectile dysfunction: Secondary | ICD-10-CM

## 2014-08-01 DIAGNOSIS — I10 Essential (primary) hypertension: Secondary | ICD-10-CM

## 2014-08-01 DIAGNOSIS — F528 Other sexual dysfunction not due to a substance or known physiological condition: Secondary | ICD-10-CM

## 2014-08-01 MED ORDER — LOSARTAN POTASSIUM-HCTZ 50-12.5 MG PO TABS: ORAL_TABLET | ORAL | Status: DC

## 2014-08-01 MED ORDER — SILDENAFIL CITRATE 100 MG PO TABS: 100.0000 mg | ORAL_TABLET | ORAL | Status: AC | PRN

## 2014-08-01 NOTE — Progress Notes (Signed)
Pre visit review using our clinic review tool, if applicable. No additional management support is needed unless otherwise documented below in the visit note. 

## 2014-08-01 NOTE — Progress Notes (Signed)
   Subjective:    Patient ID: Timothy Duncan, male    DOB: Jun 03, 1941, 74 y.o.   MRN: [id number]  HPI Timothy Duncan is a 74 year old male who comes in today for follow-up of hypertension  He takes losartan 50-12 0.5 one half tab daily BP 120/78  He hasn't had a physical exam in a couple years  He also uses Viagra when necessary for ED   Review of Systems    review of systems otherwise negative Objective:   Physical Exam Well-developed well-nourished male no acute distress vital signs stable is afebrile BP right arm sitting position 120/78       Assessment & Plan:  Hypertension at goal........... continue current therapy follow-up CPX.

## 2014-08-01 NOTE — Patient Instructions (Signed)
Continue current blood pressure medication one half tab daily  Blood pressure today 120/78  [email or web address] for the Viagra  Return sometime for a physical exam  Labs one week prior 11:30 AM light breakfast

## 2014-08-02 ENCOUNTER — Telehealth: Payer: Self-pay | Admitting: Family Medicine

## 2014-08-02 NOTE — Telephone Encounter (Signed)
emmi emailed °

## 2014-09-01 ENCOUNTER — Other Ambulatory Visit (INDEPENDENT_AMBULATORY_CARE_PROVIDER_SITE_OTHER): Payer: Medicare HMO

## 2014-09-01 DIAGNOSIS — Z125 Encounter for screening for malignant neoplasm of prostate: Secondary | ICD-10-CM

## 2014-09-01 DIAGNOSIS — I10 Essential (primary) hypertension: Secondary | ICD-10-CM

## 2014-09-01 LAB — BASIC METABOLIC PANEL
BUN: 16 mg/dL (ref 6–23)
CALCIUM: 9.6 mg/dL (ref 8.4–10.5)
CO2: 30 meq/L (ref 19–32)
Chloride: 104 mEq/L (ref 96–112)
Creatinine, Ser: 1.2 mg/dL (ref 0.40–1.50)
GFR: 63 mL/min (ref >60.00)
GLUCOSE: 94 mg/dL (ref 70–99)
POTASSIUM: 4.3 meq/L (ref 3.5–5.1)
Sodium: 139 mEq/L (ref 135–145)

## 2014-09-01 LAB — POCT URINALYSIS DIPSTICK
BILIRUBIN UA: NEGATIVE
Blood, UA: NEGATIVE
Glucose, UA: NEGATIVE
Ketones, UA: NEGATIVE
Leukocytes, UA: NEGATIVE
NITRITE UA: NEGATIVE
Protein, UA: NEGATIVE
Spec Grav, UA: 1.01
Urobilinogen, UA: 0.2
pH, UA: 6.5

## 2014-09-01 LAB — HEPATIC FUNCTION PANEL
ALT: 20 U/L (ref 0–53)
AST: 15 U/L (ref 0–37)
Albumin: 4.3 g/dL (ref 3.5–5.2)
Alkaline Phosphatase: 63 U/L (ref 39–117)
Bilirubin, Direct: 0.3 mg/dL (ref 0.0–0.3)
Total Bilirubin: 1.7 mg/dL — ABNORMAL HIGH (ref 0.2–1.2)
Total Protein: 7 g/dL (ref 6.0–8.3)

## 2014-09-01 LAB — PSA: PSA: 1.03 ng/mL (ref 0.10–4.00)

## 2014-09-01 LAB — CBC WITH DIFFERENTIAL/PLATELET
Basophils Absolute: 0 10*3/uL (ref 0.0–0.1)
Basophils Relative: 0.5 % (ref 0.0–3.0)
Eosinophils Absolute: 0.2 10*3/uL (ref 0.0–0.7)
Eosinophils Relative: 1.9 % (ref 0.0–5.0)
HCT: 46.4 % (ref 39.0–52.0)
Hemoglobin: 15.6 g/dL (ref 13.0–17.0)
LYMPHS PCT: 25.9 % (ref 12.0–46.0)
Lymphs Abs: 2.2 10*3/uL (ref 0.7–4.0)
MCHC: 33.7 g/dL (ref 30.0–36.0)
MCV: 91.4 fl (ref 78.0–100.0)
MONO ABS: 0.8 10*3/uL (ref 0.1–1.0)
MONOS PCT: 8.8 % (ref 3.0–12.0)
NEUTROS ABS: 5.5 10*3/uL (ref 1.4–7.7)
NEUTROS PCT: 62.9 % (ref 43.0–77.0)
Platelets: 241 10*3/uL (ref 150.0–400.0)
RBC: 5.08 Mil/uL (ref 4.22–5.81)
RDW: 13.8 % (ref 11.5–15.5)
WBC: 8.7 10*3/uL (ref 4.0–10.5)

## 2014-09-01 LAB — TSH: TSH: 2.73 u[IU]/mL (ref 0.35–4.50)

## 2014-09-01 LAB — LIPID PANEL
CHOLESTEROL: 168 mg/dL (ref 0–200)
HDL: 40.4 mg/dL (ref >39.00)
LDL Cholesterol: 103 mg/dL — ABNORMAL HIGH (ref 0–99)
NonHDL: 127.6
TRIGLYCERIDES: 122 mg/dL (ref 0.0–149.0)
Total CHOL/HDL Ratio: 4
VLDL: 24.4 mg/dL (ref 0.0–40.0)

## 2014-09-07 ENCOUNTER — Encounter: Payer: Self-pay | Admitting: Family Medicine

## 2014-09-07 ENCOUNTER — Ambulatory Visit (INDEPENDENT_AMBULATORY_CARE_PROVIDER_SITE_OTHER): Payer: Medicare HMO | Admitting: Family Medicine

## 2014-09-07 VITALS — BP 120/84 | Temp 98.6°F | Ht 69.0 in | Wt 285.0 lb

## 2014-09-07 DIAGNOSIS — F528 Other sexual dysfunction not due to a substance or known physiological condition: Secondary | ICD-10-CM

## 2014-09-07 DIAGNOSIS — I10 Essential (primary) hypertension: Secondary | ICD-10-CM

## 2014-09-07 DIAGNOSIS — Z0000 Encounter for general adult medical examination without abnormal findings: Secondary | ICD-10-CM | POA: Diagnosis not present

## 2014-09-07 NOTE — Progress Notes (Signed)
   Subjective:    Patient ID: Timothy Duncan, male    DOB: Apr 10, 1941, 74 y.o.   MRN: [id number]  HPI Barnabas Lister is a 74 year old divorce male nonsmoker who comes in today for general physical examination because of a history of hypertension. He takes losartan 50-12 0.5 daily for hypertension BP 120/80  He says he feels well and has no complaints. He still works full-time 60+ hours a week  He gets routine eye care, dental care, colonoscopy and GI normal  Vaccinations up-to-date he declines a Arboriculturist function normal he works 60 hours a week home health safety reviewed no issues identified, no guns in the house, he does not have a healthcare power of attorney nor living well. Encouraged to do so   Review of Systems  Constitutional: Negative.   HENT: Negative.   Eyes: Negative.   Respiratory: Negative.   Cardiovascular: Negative.   Gastrointestinal: Negative.   Endocrine: Negative.   Genitourinary: Negative.   Musculoskeletal: Negative.   Skin: Negative.   Allergic/Immunologic: Negative.   Neurological: Negative.   Hematological: Negative.   Psychiatric/Behavioral: Negative.        Objective:   Physical Exam  Constitutional: He is oriented to person, place, and time. He appears well-developed and well-nourished.  HENT:  Head: Normocephalic and atraumatic.  Right Ear: External ear normal.  Left Ear: External ear normal.  Nose: Nose normal.  Mouth/Throat: Oropharynx is clear and moist.  Eyes: Conjunctivae and EOM are normal. Pupils are equal, round, and reactive to light.  Neck: Normal range of motion. Neck supple. No JVD present. No tracheal deviation present. No thyromegaly present.  Cardiovascular: Normal rate, regular rhythm, normal heart sounds and intact distal pulses.  Exam reveals no gallop and no friction rub.   No murmur heard. Pulmonary/Chest: Effort normal and breath sounds normal. No stridor. No respiratory distress. He has no wheezes. He has no rales. He  exhibits no tenderness.  Abdominal: Soft. Bowel sounds are normal. He exhibits no distension and no mass. There is no tenderness. There is no rebound and no guarding.  Genitourinary: Rectum normal and penis normal. Guaiac negative stool. No penile tenderness.  1+ symmetrical nonnodular BPH  Musculoskeletal: Normal range of motion. He exhibits no edema or tenderness.  Lymphadenopathy:    He has no cervical adenopathy.  Neurological: He is alert and oriented to person, place, and time. He has normal reflexes. No cranial nerve deficit. He exhibits normal muscle tone.  Skin: Skin is warm and dry. No rash noted. No erythema. No pallor.  Psychiatric: He has a normal mood and affect. His behavior is normal. Judgment and thought content normal.  Nursing note and vitals reviewed.         Assessment & Plan:  Healthy male  Hypertension ago continue current therapy  Overweight,,,,,,, again encouraged diet exercise and weight loss

## 2014-09-07 NOTE — Patient Instructions (Signed)
Continue current medications  Work are this spring on the diet exercise and weight loss  Return in one year sooner if any problems,,,,,,,,cory nafzinger...... is our new adult specialist....... nurse practitioner from [facility]

## 2014-11-16 ENCOUNTER — Ambulatory Visit (INDEPENDENT_AMBULATORY_CARE_PROVIDER_SITE_OTHER): Payer: Medicare HMO | Admitting: Adult Health

## 2014-11-16 ENCOUNTER — Encounter: Payer: Self-pay | Admitting: Adult Health

## 2014-11-16 VITALS — BP 110/70 | HR 91 | Temp 98.9°F | Ht 69.0 in | Wt 262.3 lb

## 2014-11-16 DIAGNOSIS — J069 Acute upper respiratory infection, unspecified: Secondary | ICD-10-CM

## 2014-11-16 MED ORDER — BENZONATATE 200 MG PO CAPS
200.0000 mg | ORAL_CAPSULE | Freq: Three times a day (TID) | ORAL | Status: DC | PRN
Start: 2014-11-16 — End: 2015-07-11

## 2014-11-16 NOTE — Patient Instructions (Addendum)
It appears that you have an upper respiratory infection caused by a virus. I do not think that your condition warrants a round of antibiotics at this time. Rest and drink plenty of fluids. You should start to feel better in the next few days. I have sent a prescription to the pharmacy for Tessalon Pearls to help with your cough. You can also try over the counter Delsym. Take tylenol or ibuprofen every 6-8 hours for the muscle aches.    If you develop a fever greater than 101 F, shortness of breath or increased sputum production then please follow up. It was great meeting you today and I hope you feel better.   Upper Respiratory Infection, Adult An upper respiratory infection (URI) is also sometimes known as the common cold. The upper respiratory tract includes the nose, sinuses, throat, trachea, and bronchi. Bronchi are the airways leading to the lungs. Most people improve within 1 week, but symptoms can last up to 2 weeks. A residual cough may last even longer.  CAUSES Many different viruses can infect the tissues lining the upper respiratory tract. The tissues become irritated and inflamed and often become very moist. Mucus production is also common. A cold is contagious. You can easily spread the virus to others by oral contact. This includes kissing, sharing a glass, coughing, or sneezing. Touching your mouth or nose and then touching a surface, which is then touched by another person, can also spread the virus. SYMPTOMS  Symptoms typically develop 1 to 3 days after you come in contact with a cold virus. Symptoms vary from person to person. They may include:  Runny nose.  Sneezing.  Nasal congestion.  Sinus irritation.  Sore throat.  Loss of voice (laryngitis).  Cough.  Fatigue.  Muscle aches.  Loss of appetite.  Headache.  Low-grade fever. DIAGNOSIS  You might diagnose your own cold based on familiar symptoms, since most people get a cold 2 to 3 times a year. Your caregiver  can confirm this based on your exam. Most importantly, your caregiver can check that your symptoms are not due to another disease such as strep throat, sinusitis, pneumonia, asthma, or epiglottitis. Blood tests, throat tests, and X-rays are not necessary to diagnose a common cold, but they may sometimes be helpful in excluding other more serious diseases. Your caregiver will decide if any further tests are required. RISKS AND COMPLICATIONS  You may be at risk for a more severe case of the common cold if you smoke cigarettes, have chronic heart disease (such as heart failure) or lung disease (such as asthma), or if you have a weakened immune system. The very young and very old are also at risk for more serious infections. Bacterial sinusitis, middle ear infections, and bacterial pneumonia can complicate the common cold. The common cold can worsen asthma and chronic obstructive pulmonary disease (COPD). Sometimes, these complications can require emergency medical care and may be life-threatening. PREVENTION  The best way to protect against getting a cold is to practice good hygiene. Avoid oral or hand contact with people with cold symptoms. Wash your hands often if contact occurs. There is no clear evidence that vitamin C, vitamin E, echinacea, or exercise reduces the chance of developing a cold. However, it is always recommended to get plenty of rest and practice good nutrition. TREATMENT  Treatment is directed at relieving symptoms. There is no cure. Antibiotics are not effective, because the infection is caused by a virus, not by bacteria. Treatment may include:  Increased fluid intake. Sports drinks offer valuable electrolytes, sugars, and fluids.  Breathing heated mist or steam (vaporizer or shower).  Eating chicken soup or other clear broths, and maintaining good nutrition.  Getting plenty of rest.  Using gargles or lozenges for comfort.  Controlling fevers with ibuprofen or acetaminophen as  directed by your caregiver.  Increasing usage of your inhaler if you have asthma. Zinc gel and zinc lozenges, taken in the first 24 hours of the common cold, can shorten the duration and lessen the severity of symptoms. Pain medicines may help with fever, muscle aches, and throat pain. A variety of non-prescription medicines are available to treat congestion and runny nose. Your caregiver can make recommendations and may suggest nasal or lung inhalers for other symptoms.  HOME CARE INSTRUCTIONS   Only take over-the-counter or prescription medicines for pain, discomfort, or fever as directed by your caregiver.  Use a warm mist humidifier or inhale steam from a shower to increase air moisture. This may keep secretions moist and make it easier to breathe.  Drink enough water and fluids to keep your urine clear or pale yellow.  Rest as needed.  Return to work when your temperature has returned to normal or as your caregiver advises. You may need to stay home longer to avoid infecting others. You can also use a face mask and careful hand washing to prevent spread of the virus. SEEK MEDICAL CARE IF:   After the first few days, you feel you are getting worse rather than better.  You need your caregiver's advice about medicines to control symptoms.  You develop chills, worsening shortness of breath, or brown or red sputum. These may be signs of pneumonia.  You develop yellow or brown nasal discharge or pain in the face, especially when you bend forward. These may be signs of sinusitis.  You develop a fever, swollen neck glands, pain with swallowing, or white areas in the back of your throat. These may be signs of strep throat. SEEK IMMEDIATE MEDICAL CARE IF:   You have a fever.  You develop severe or persistent headache, ear pain, sinus pain, or chest pain.  You develop wheezing, a prolonged cough, cough up blood, or have a change in your usual mucus (if you have chronic lung disease).  You  develop sore muscles or a stiff neck. Document Released: 12/10/2000 Document Revised: 09/08/2011 Document Reviewed: 09/21/2013 [facility] Patient Information 2015 [place], [place]. This information is not intended to replace advice given to you by your health care provider. Make sure you discuss any questions you have with your health care provider.

## 2014-11-16 NOTE — Progress Notes (Signed)
   Subjective:    Patient ID: Timothy Duncan, male    DOB: Mar 15, 1941, 74 y.o.   MRN: [id number]  HPI  Timothy Duncan is a patient of Dr. Sherren Mocha who I am seeing today for productive cough with green mucus, chest congestion, and body aches that started on Sunday.   He denies fever, SOB, nausea, vomiting, or diarrhea. He has no sore throat or swollen lymph nodes.   Has tried OTC Oscillococcinum ( homeopathic flu medication) without relief.   Review of Systems  Constitutional: Positive for activity change. Negative for fever, chills, diaphoresis, appetite change, fatigue and unexpected weight change.  HENT: Positive for congestion (chest). Negative for ear discharge, ear pain, hearing loss, postnasal drip, rhinorrhea, sinus pressure, sneezing, sore throat and trouble swallowing.   Eyes: Negative for pain and redness.  Respiratory: Positive for cough. Negative for choking, chest tightness, shortness of breath, wheezing and stridor.   Cardiovascular: Negative for chest pain, palpitations and leg swelling.  Musculoskeletal: Positive for myalgias. Negative for arthralgias, neck pain and neck stiffness.  Neurological: Negative for dizziness, weakness and headaches.  All other systems reviewed and are negative.      Objective:   Physical Exam  Constitutional: He is oriented to person, place, and time. He appears well-developed. No distress.  HENT:  Head: Normocephalic and atraumatic.  Right Ear: External ear normal.  Left Ear: External ear normal.  Nose: Nose normal.  Mouth/Throat: Oropharynx is clear and moist. No oropharyngeal exudate.  Cardiovascular: Normal rate, regular rhythm, normal heart sounds and intact distal pulses.  Exam reveals no gallop and no friction rub.   No murmur heard. Pulmonary/Chest: Effort normal and breath sounds normal. No respiratory distress. He has no wheezes. He has no rales. He exhibits no tenderness.  Musculoskeletal: Normal range of motion.  Lymphadenopathy:   He has no cervical adenopathy.  Neurological: He is alert and oriented to person, place, and time.  Skin: Skin is warm and dry. No rash noted. He is not diaphoretic. No erythema. No pallor.  Psychiatric: He has a normal mood and affect. His behavior is normal. Judgment and thought content normal.  Nursing note and vitals reviewed.      Assessment & Plan:  1. Acute upper respiratory infection - benzonatate (TESSALON) 200 MG capsule; Take 1 capsule (200 mg total) by mouth 3 (three) times daily as needed for cough.  Dispense: 20 capsule; Refill: 0 - Tylenol or ibuprofen every 6-8 hours as needed for muscle pain.  - Follow up if fever greater than 101, SOB, increased sputum production.  - Follow up if not feeling better in 2-3 days.

## 2014-11-16 NOTE — Progress Notes (Signed)
Pre visit review using our clinic review tool, if applicable. No additional management support is needed unless otherwise documented below in the visit note. 

## 2014-11-21 ENCOUNTER — Encounter: Payer: Self-pay | Admitting: Adult Health

## 2014-11-21 ENCOUNTER — Ambulatory Visit (INDEPENDENT_AMBULATORY_CARE_PROVIDER_SITE_OTHER): Payer: Medicare HMO | Admitting: Adult Health

## 2014-11-21 ENCOUNTER — Other Ambulatory Visit: Payer: Self-pay | Admitting: Adult Health

## 2014-11-21 ENCOUNTER — Ambulatory Visit (INDEPENDENT_AMBULATORY_CARE_PROVIDER_SITE_OTHER)
Admission: RE | Admit: 2014-11-21 | Discharge: 2014-11-21 | Disposition: A | Discharge status: Home / Self Care | Payer: Medicare HMO | Source: Ambulatory Visit | Attending: Adult Health | Admitting: Adult Health

## 2014-11-21 ENCOUNTER — Telehealth: Payer: Self-pay | Admitting: Adult Health

## 2014-11-21 VITALS — BP 110/80 | HR 98 | Temp 99.3°F | Ht 69.0 in | Wt 257.4 lb

## 2014-11-21 DIAGNOSIS — R05 Cough: Secondary | ICD-10-CM

## 2014-11-21 DIAGNOSIS — R059 Cough, unspecified: Secondary | ICD-10-CM

## 2014-11-21 DIAGNOSIS — J209 Acute bronchitis, unspecified: Secondary | ICD-10-CM

## 2014-11-21 MED ORDER — CEFUROXIME AXETIL 500 MG PO TABS: 500.0000 mg | ORAL_TABLET | Freq: Two times a day (BID) | ORAL | Status: DC

## 2014-11-21 MED ORDER — HYDROCODONE-HOMATROPINE 5-1.5 MG/5ML PO SYRP: 5.0000 mL | ORAL_SOLUTION | Freq: Four times a day (QID) | ORAL | Status: DC | PRN

## 2014-11-21 NOTE — Patient Instructions (Addendum)
Please follow up at the [facility] office for a chest x ray. I will let you know the results of the test. You can take the Hycodan syrup every 6 hours as needed. It will make you sleepy, please do not drive while taking this medication. You can try over the counter Delsym as well during the day for your cough.    Acute Bronchitis Bronchitis is inflammation of the airways that extend from the windpipe into the lungs (bronchi). The inflammation often causes mucus to develop. This leads to a cough, which is the most common symptom of bronchitis.  In acute bronchitis, the condition usually develops suddenly and goes away over time, usually in a couple weeks. Smoking, allergies, and asthma can make bronchitis worse. Repeated episodes of bronchitis may cause further lung problems.  CAUSES Acute bronchitis is most often caused by the same virus that causes a cold. The virus can spread from person to person (contagious) through coughing, sneezing, and touching contaminated objects. SIGNS AND SYMPTOMS   Cough.   Fever.   Coughing up mucus.   Body aches.   Chest congestion.   Chills.   Shortness of breath.   Sore throat.  DIAGNOSIS  Acute bronchitis is usually diagnosed through a physical exam. Your health care provider will also ask you questions about your medical history. Tests, such as chest X-rays, are sometimes done to rule out other conditions.  TREATMENT  Acute bronchitis usually goes away in a couple weeks. Oftentimes, no medical treatment is necessary. Medicines are sometimes given for relief of fever or cough. Antibiotic medicines are usually not needed but may be prescribed in certain situations. In some cases, an inhaler may be recommended to help reduce shortness of breath and control the cough. A cool mist vaporizer may also be used to help thin bronchial secretions and make it easier to clear the chest.  HOME CARE INSTRUCTIONS  Get plenty of rest.   Drink enough fluids to  keep your urine clear or pale yellow (unless you have a medical condition that requires fluid restriction). Increasing fluids may help thin your respiratory secretions (sputum) and reduce chest congestion, and it will prevent dehydration.   Take medicines only as directed by your health care provider.  If you were prescribed an antibiotic medicine, finish it all even if you start to feel better.  Avoid smoking and secondhand smoke. Exposure to cigarette smoke or irritating chemicals will make bronchitis worse. If you are a smoker, consider using nicotine gum or skin patches to help control withdrawal symptoms. Quitting smoking will help your lungs heal faster.   Reduce the chances of another bout of acute bronchitis by washing your hands frequently, avoiding people with cold symptoms, and trying not to touch your hands to your mouth, nose, or eyes.   Keep all follow-up visits as directed by your health care provider.  SEEK MEDICAL CARE IF: Your symptoms do not improve after 1 week of treatment.  SEEK IMMEDIATE MEDICAL CARE IF:  You develop an increased fever or chills.   You have chest pain.   You have severe shortness of breath.  You have bloody sputum.   You develop dehydration.  You faint or repeatedly feel like you are going to pass out.  You develop repeated vomiting.  You develop a severe headache. MAKE SURE YOU:   Understand these instructions.  Will watch your condition.  Will get help right away if you are not doing well or get worse. Document Released: 07/24/2004 Document  Revised: 10/31/2013 Document Reviewed: 12/07/2012 [facility] Patient Information 2015 [place], [place]. This information is not intended to replace advice given to you by your health care provider. Make sure you discuss any questions you have with your health care provider.

## 2014-11-21 NOTE — Progress Notes (Signed)
Pre visit review using our clinic review tool, if applicable. No additional management support is needed unless otherwise documented below in the visit note. 

## 2014-11-21 NOTE — Progress Notes (Signed)
Subjective:    Patient ID: Timothy Duncan, male    DOB: April 29, 1941, 74 y.o.   MRN: [id number]  HPI  Patient returns to the office for follow up regarding his last visit for upper respiratory infection on 11/16/2014. He continues to have productive cough with green mucus, chest congestion and body aches. He has a low grade fever in the office but has not been feeling as though he has a fever at home.   Endorses that he does not feel like eating.   Has tried Tessalon Pearls without help;  No nausea, vomiting, diarrhea.   Review of Systems  Constitutional: Positive for fever, activity change, appetite change and fatigue. Negative for chills.  HENT: Positive for congestion.   Respiratory: Positive for cough. Negative for chest tightness and shortness of breath.   Cardiovascular: Negative for chest pain, palpitations and leg swelling.  Musculoskeletal: Positive for myalgias. Negative for arthralgias.  All other systems reviewed and are negative.  Past Medical History  Diagnosis Date  . Hypertension   . ED (erectile dysfunction)     History   Social History  . Marital Status: Single    Spouse Name: N/A  . Number of Children: N/A  . Years of Education: N/A   Occupational History  . Not on file.   Social History Main Topics  . Smoking status: Former Research scientist (life sciences)  . Smokeless tobacco: Not on file  . Alcohol Use: Yes  . Drug Use: No  . Sexual Activity: Not on file   Other Topics Concern  . Not on file   Social History Narrative    Past Surgical History  Procedure Laterality Date  . Cataract extraction    . Appendectomy    . Tonsillectomy      No family history on file.  No Known Allergies  Current Outpatient Prescriptions on File Prior to Visit  Medication Sig Dispense Refill  . aspirin 325 MG tablet Take 325 mg by mouth daily.      Marland Kitchen losartan-hydrochlorothiazide (HYZAAR) 50-12.5 MG per tablet Take 1/2 tablet by mouth in the morning 45 tablet 3  . Omega-3 350 MG  CAPS Take by mouth daily.      . sildenafil (VIAGRA) 100 MG tablet Take 1 tablet (100 mg total) by mouth as needed for erectile dysfunction. 10 tablet 10  . benzonatate (TESSALON) 200 MG capsule Take 1 capsule (200 mg total) by mouth 3 (three) times daily as needed for cough. (Patient not taking: Reported on 11/21/2014) 20 capsule 0   No current facility-administered medications on file prior to visit.    BP 110/80 mmHg  Pulse 98  Temp(Src) 99.3 F (37.4 C) (Oral)  Ht 5\' 9"  (1.753 m)  Wt 257 lb 6.4 oz (116.756 kg)  BMI 37.99 kg/m2  SpO2 97%      Objective:   Physical Exam  Constitutional: He is oriented to person, place, and time. He appears well-developed and well-nourished. No distress.  Cardiovascular: Normal rate, regular rhythm, normal heart sounds and intact distal pulses.  Exam reveals no gallop and no friction rub.   No murmur heard. Pulmonary/Chest: Effort normal and breath sounds normal. No respiratory distress. He has no wheezes. He has no rales. He exhibits no tenderness.  Musculoskeletal: Normal range of motion. He exhibits no edema or tenderness.  Lymphadenopathy:    He has no cervical adenopathy.  Neurological: He is alert and oriented to person, place, and time.  Skin: Skin is warm and dry. No rash noted.  He is not diaphoretic. No erythema. No pallor.  Psychiatric: He has a normal mood and affect. His behavior is normal. Judgment and thought content normal.  Vitals reviewed.     Assessment & Plan:   1. Cough - DG Chest 2 View; Future - HYDROcodone-homatropine (HYCODAN) 5-1.5 MG/5ML syrup; Take 5 mLs by mouth every 6 (six) hours as needed for cough.  Dispense: 120 mL; Refill: 0 - Will follow up after chest x ray results.

## 2014-11-21 NOTE — Telephone Encounter (Signed)
Left VM on patient's cell phone. I informed him that he does not have pneumonia- per chest xray. I will treat him for bronchitis with Ceftin 500mg  BID for 7 days. 14 pills, no refills.

## 2015-03-26 ENCOUNTER — Ambulatory Visit (INDEPENDENT_AMBULATORY_CARE_PROVIDER_SITE_OTHER): Payer: Medicare HMO | Admitting: Adult Health

## 2015-03-26 DIAGNOSIS — M5441 Lumbago with sciatica, right side: Secondary | ICD-10-CM | POA: Diagnosis not present

## 2015-03-26 MED ORDER — KETOROLAC TROMETHAMINE 60 MG/2ML IM SOLN
60.0000 mg | Freq: Once | INTRAMUSCULAR | Status: DC
  Administered 2015-03-26: 60 mg via INTRAMUSCULAR

## 2015-03-26 MED ORDER — HYDROCODONE-ACETAMINOPHEN 10-325 MG PO TABS: 1.0000 | ORAL_TABLET | Freq: Three times a day (TID) | ORAL | Status: DC | PRN

## 2015-03-26 MED ORDER — TIZANIDINE HCL 4 MG PO TABS: 4.0000 mg | ORAL_TABLET | Freq: Four times a day (QID) | ORAL | Status: DC | PRN

## 2015-03-26 NOTE — Patient Instructions (Addendum)
Take the Norco for pain, this is a narcotic pain medication. They will make you sleepy, so please do not drive.   Use the Zanaflex every six hours, this is a muscle relaxer and can also make you sleepy.   Continue to take 600mg  Ibuprofen every 8 hours as needed.   Someone will call you to schedule the MRI  Follow up if no improvement in the next 2-3 days.

## 2015-03-26 NOTE — Progress Notes (Signed)
Subjective:    Patient ID: Timothy Duncan, male    DOB: 1941-05-02, 74 y.o.   MRN: [id number]  HPI  74 year old male, patient of Dr. Sherren Mocha who presents to the office today for lower lumbar pain that radiates down the right leg to the knee. The pain has been there since 02/05/2015. The pain has come and gone but since Saturday the pain has been getting worse. He went to [facility] yesterday and was given Tramadol. Pain is described as "sharp" and the pain has not gone away with the Tramadol. He has to walk with a walker so he can get around due the pain. He does not normally walk with a walker. He has also been trying to do stretches but that the pain is so bad that he is unable to preform them. Has also been using ibuprofen and heating pad, which does not help either.   Denies any issues with loss of bowel or bladder. No spinal pain or neck pain. Denies any trauma to the area. No numbness or tingling    Review of Systems  Constitutional: Negative.   Respiratory: Negative.   Cardiovascular: Negative.   Gastrointestinal: Negative for nausea, vomiting, abdominal pain, diarrhea, constipation and rectal pain.  Genitourinary: Negative for difficulty urinating.  Musculoskeletal: Positive for myalgias, back pain and arthralgias. Negative for joint swelling, neck pain and neck stiffness.  Neurological: Negative.   All other systems reviewed and are negative.  Past Medical History  Diagnosis Date  . Hypertension   . ED (erectile dysfunction)     Social History   Social History  . Marital Status: Single    Spouse Name: N/A  . Number of Children: N/A  . Years of Education: N/A   Occupational History  . Not on file.   Social History Main Topics  . Smoking status: Former Research scientist (life sciences)  . Smokeless tobacco: Not on file  . Alcohol Use: Yes  . Drug Use: No  . Sexual Activity: Not on file   Other Topics Concern  . Not on file   Social History Narrative    Past Surgical History    Procedure Laterality Date  . Cataract extraction    . Appendectomy    . Tonsillectomy      No family history on file.  No Known Allergies  Current Outpatient Prescriptions on File Prior to Visit  Medication Sig Dispense Refill  . aspirin 325 MG tablet Take 325 mg by mouth daily.      . benzonatate (TESSALON) 200 MG capsule Take 1 capsule (200 mg total) by mouth 3 (three) times daily as needed for cough. (Patient not taking: Reported on 11/21/2014) 20 capsule 0  . cefUROXime (CEFTIN) 500 MG tablet Take 1 tablet (500 mg total) by mouth 2 (two) times daily with a meal. 14 tablet 0  . HYDROcodone-homatropine (HYCODAN) 5-1.5 MG/5ML syrup Take 5 mLs by mouth every 6 (six) hours as needed for cough. 120 mL 0  . losartan-hydrochlorothiazide (HYZAAR) 50-12.5 MG per tablet Take 1/2 tablet by mouth in the morning 45 tablet 3  . Omega-3 350 MG CAPS Take by mouth daily.      . sildenafil (VIAGRA) 100 MG tablet Take 1 tablet (100 mg total) by mouth as needed for erectile dysfunction. 10 tablet 10   No current facility-administered medications on file prior to visit.    There were no vitals taken for this visit.       Objective:   Physical Exam  Constitutional: He is oriented to person, place, and time. He appears well-developed and well-nourished. No distress.  Eyes: Right eye exhibits no discharge. Left eye exhibits no discharge.  Cardiovascular: Normal rate, regular rhythm, normal heart sounds and intact distal pulses.  Exam reveals no gallop and no friction rub.   No murmur heard. Pulmonary/Chest: Effort normal and breath sounds normal. No respiratory distress. He has no wheezes. He has no rales. He exhibits no tenderness.  Musculoskeletal: Normal range of motion. He exhibits tenderness (to right lower back/buttocks. ). He exhibits no edema.  No tenderness to spinal column with palpation.  No bruising or trauma noted.  No pain with palpation down right leg.   Lymphadenopathy:    He has  no cervical adenopathy.  Neurological: He is alert and oriented to person, place, and time. He has normal reflexes.  Skin: Skin is warm and dry. No rash noted. He is not diaphoretic. No erythema. No pallor.  Psychiatric: He has a normal mood and affect. His behavior is normal. Judgment and thought content normal.  Nursing note and vitals reviewed.     Assessment & Plan:  1. Right-sided low back pain with right-sided sciatica - Lumbar spinal stenosis vs Degenerative disc disease vsSpondylolisthesis. Low suspicion for cauda equina syndrome.  - HYDROcodone-acetaminophen (NORCO) 10-325 MG per tablet; Take 1 tablet by mouth every 8 (eight) hours as needed.  Dispense: 30 tablet; Refill: 0 - MR Lumbar Spine Wo Contrast; Future - tiZANidine (ZANAFLEX) 4 MG tablet; Take 1 tablet (4 mg total) by mouth every 6 (six) hours as needed for muscle spasms.  Dispense: 30 tablet; Refill: 0 - ketorolac (TORADOL) injection 60 mg; Inject 2 mLs (60 mg total) into the muscle once. - Did not want prednisone due to side effects - Continue with heating pad and ibuprofen - Follow up if no improvement in 2-3 days - Go to the ER with any loss of bowel or bladder

## 2015-04-05 ENCOUNTER — Telehealth: Payer: Self-pay | Admitting: Family Medicine

## 2015-04-05 DIAGNOSIS — M5441 Lumbago with sciatica, right side: Secondary | ICD-10-CM

## 2015-04-05 MED ORDER — HYDROCODONE-ACETAMINOPHEN 10-325 MG PO TABS: 1.0000 | ORAL_TABLET | Freq: Three times a day (TID) | ORAL | Status: DC | PRN

## 2015-04-05 NOTE — Telephone Encounter (Signed)
Rx printed. Pt has walked into the office to pick up.

## 2015-04-05 NOTE — Telephone Encounter (Signed)
Ok to refill hydrocodone for one month

## 2015-04-05 NOTE — Telephone Encounter (Signed)
Pt would like to pu in the next 30 min  [phone]

## 2015-04-05 NOTE — Telephone Encounter (Signed)
Pt needs new rx hydrocodone °

## 2015-04-06 ENCOUNTER — Telehealth: Payer: Self-pay | Admitting: Family Medicine

## 2015-04-06 ENCOUNTER — Other Ambulatory Visit: Payer: Self-pay | Admitting: Adult Health

## 2015-04-06 DIAGNOSIS — M5441 Lumbago with sciatica, right side: Secondary | ICD-10-CM

## 2015-04-06 NOTE — Telephone Encounter (Signed)
Called and spoke with pt and pt is aware that referral has been placed.

## 2015-04-06 NOTE — Telephone Encounter (Signed)
I placed an urgent referral to Orthopedics for him

## 2015-04-06 NOTE — Telephone Encounter (Signed)
Called and spoke with pt and pt is aware of recommendations.  Pt states she is in extreme pain and needs an MRI soon.  Pt states he is currently sitting in his seat at work and his right leg is hurting. Pt would like to know if this can be appealed.  Pt states he started off at chiropractor and then came to [place] for this concern.  Pls advise.

## 2015-04-06 NOTE — Telephone Encounter (Signed)
Contacted patients insurance regarding denial of MRI Lumbar Spine.  Insurance stated:  Based on Medicare guidelines and the records they have on patients condition they are unable to approve requested service (MRI). With the dx of back and/or neck pain patients insurance will only cover a MRI of the spine when records indicate/support one or more of the following:  1. Patient has failed to improve following at least 6 weeks of doctor prescribed treatment and/or observation.  Follow up contact with doctor is required after 6 weeks.  This may be done by phone, mail or messaging. 2. Surgery is being planned for the patient 3. Patient is experiencing severe weakness 4. Patient has had recent tissue sample taken for lab testing/abnormal biopsy results. 5. Recent infection 6. 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

## 2015-04-09 DIAGNOSIS — M545 Low back pain: Secondary | ICD-10-CM | POA: Diagnosis not present

## 2015-04-12 ENCOUNTER — Telehealth: Payer: Self-pay | Admitting: Family Medicine

## 2015-04-12 DIAGNOSIS — M5441 Lumbago with sciatica, right side: Secondary | ICD-10-CM

## 2015-04-12 DIAGNOSIS — M545 Low back pain: Secondary | ICD-10-CM | POA: Diagnosis not present

## 2015-04-12 NOTE — Telephone Encounter (Signed)
Pt needs new rx hydrocodone °

## 2015-04-16 ENCOUNTER — Other Ambulatory Visit (HOSPITAL_COMMUNITY): Payer: Self-pay | Admitting: Neurological Surgery

## 2015-04-16 DIAGNOSIS — M5126 Other intervertebral disc displacement, lumbar region: Secondary | ICD-10-CM | POA: Diagnosis not present

## 2015-04-16 DIAGNOSIS — M545 Low back pain: Secondary | ICD-10-CM | POA: Diagnosis not present

## 2015-04-16 MED ORDER — HYDROCODONE-ACETAMINOPHEN 10-325 MG PO TABS: 1.0000 | ORAL_TABLET | Freq: Three times a day (TID) | ORAL | Status: DC | PRN

## 2015-04-16 NOTE — Telephone Encounter (Signed)
You can refill one additional time. His orthopedic doctor needs to prescribe him this medication after that.

## 2015-04-16 NOTE — Telephone Encounter (Signed)
Patient is aware that his next refill will have to come from the orthopedic doctor.  Prescriptions was given to the patient at his car.  His Id was verified and signature obtained.

## 2015-04-25 ENCOUNTER — Encounter (HOSPITAL_COMMUNITY): Payer: Self-pay

## 2015-04-25 ENCOUNTER — Encounter (HOSPITAL_COMMUNITY)
Admission: RE | Admit: 2015-04-25 | Discharge: 2015-04-25 | Disposition: A | Discharge status: Home / Self Care | Payer: Medicare HMO | Source: Ambulatory Visit | Attending: Neurological Surgery | Admitting: Neurological Surgery

## 2015-04-25 DIAGNOSIS — Z01812 Encounter for preprocedural laboratory examination: Secondary | ICD-10-CM | POA: Insufficient documentation

## 2015-04-25 LAB — SURGICAL PCR SCREEN
MRSA, PCR: NEGATIVE
Staphylococcus aureus: NEGATIVE

## 2015-04-25 LAB — CBC WITH DIFFERENTIAL/PLATELET
Basophils Absolute: 0 10*3/uL (ref 0.0–0.1)
Basophils Relative: 0 %
EOS ABS: 0.2 10*3/uL (ref 0.0–0.7)
EOS PCT: 2 %
HCT: 45.8 % (ref 39.0–52.0)
Hemoglobin: 15 g/dL (ref 13.0–17.0)
LYMPHS ABS: 2.3 10*3/uL (ref 0.7–4.0)
Lymphocytes Relative: 23 %
MCH: 30.4 pg (ref 26.0–34.0)
MCHC: 32.8 g/dL (ref 30.0–36.0)
MCV: 92.9 fL (ref 78.0–100.0)
MONOS PCT: 7 %
Monocytes Absolute: 0.6 10*3/uL (ref 0.1–1.0)
Neutro Abs: 6.7 10*3/uL (ref 1.7–7.7)
Neutrophils Relative %: 68 %
PLATELETS: 218 10*3/uL (ref 150–400)
RBC: 4.93 MIL/uL (ref 4.22–5.81)
RDW: 13.6 % (ref 11.5–15.5)
WBC: 9.8 10*3/uL (ref 4.0–10.5)

## 2015-04-25 LAB — BASIC METABOLIC PANEL
Anion gap: 10 (ref 5–15)
BUN: 10 mg/dL (ref 6–20)
CHLORIDE: 109 mmol/L (ref 101–111)
CO2: 23 mmol/L (ref 22–32)
CREATININE: 1 mg/dL (ref 0.61–1.24)
Calcium: 9.3 mg/dL (ref 8.9–10.3)
GFR calc Af Amer: >60 mL/min (ref >60)
GFR calc non Af Amer: >60 mL/min (ref >60)
Glucose, Bld: 104 mg/dL — ABNORMAL HIGH (ref 65–99)
Potassium: 4.1 mmol/L (ref 3.5–5.1)
SODIUM: 142 mmol/L (ref 135–145)

## 2015-04-25 LAB — PROTIME-INR
INR: 1.04 (ref 0.00–1.49)
PROTHROMBIN TIME: 13.8 s (ref 11.6–15.2)

## 2015-04-25 NOTE — Progress Notes (Signed)
PCP is Dr. Stevie Kern Denies ever seeing a cardiologist. Denies ever having a stress test, echo, or card cath. CXr noted in epic from 11-21-14 EKG noted in epic from 09-07-14

## 2015-04-25 NOTE — Pre-Procedure Instructions (Signed)
AZHAR KNOPE  04/25/2015      [id number] - [place], [place] - [address] [place] [address] Phone: [phone] Fax: [phone]    Your procedure is scheduled on Nov 3  Report to [facility] Admitting at  1200(noon)pm  Call this number if you have problems the morning of surgery:  [phone]   Remember:  Do not eat food or drink liquids after midnight.  Take these medicines the morning of surgery with A SIP OF WATER NA Stop taking aspirin, Ibuprofen, Motrin, Advil, Aleve, BC's, Goody's, Herbal medications,  Fish Oil   Do not wear jewelry, make-up or nail polish.  Do not wear lotions, powders, or perfumes.  You may wear deodorant.  Do not shave 48 hours prior to surgery.  Men may shave face and neck.  Do not bring valuables to the hospital.  [facility] is not responsible for any belongings or valuables.  Contacts, dentures or bridgework may not be worn into surgery.  Leave your suitcase in the car.  After surgery it may be brought to your room.  For patients admitted to the hospital, discharge time will be determined by your treatment team.  Patients discharged the day of surgery will not be allowed to drive home.    Special instructions:  [place] - Preparing for Surgery  Before surgery, you can play an important role.  Because skin is not sterile, your skin needs to be as free of germs as possible.  You can reduce the number of germs on you skin by washing with CHG (chlorahexidine gluconate) soap before surgery.  CHG is an antiseptic cleaner which kills germs and bonds with the skin to continue killing germs even after washing.  Please DO NOT use if you have an allergy to CHG or antibacterial soaps.  If your skin becomes reddened/irritated stop using the CHG and inform your nurse when you arrive at Short Stay.  Do not shave (including legs and underarms) for at least 48 hours prior to the first CHG shower.  You may  shave your face.  Please follow these instructions carefully:   1.  Shower with CHG Soap the night before surgery and the  morning of Surgery.  2.  If you choose to wash your hair, wash your hair first as usual with your  normal shampoo.  3.  After you shampoo, rinse your hair and body thoroughly to remove the Shampoo.  4.  Use CHG as you would any other liquid soap.  You can apply chg directly to the skin and wash gently with scrungie or a clean washcloth.  5.  Apply the CHG Soap to your body ONLY FROM THE NECK DOWN.   Do not use on open wounds or open sores.  Avoid contact with your eyes,  ears, mouth and genitals (private parts).  Wash genitals (private parts)  with your normal soap.  6.  Wash thoroughly, paying special attention to the area where your surgery  will be performed.  7.  Thoroughly rinse your body with warm water from the neck down.  8.  DO NOT shower/wash with your normal soap after using and rinsing off  the CHG Soap.  9.  Pat yourself dry with a clean towel.            10.  Wear clean pajamas.            11.  Place clean sheets on your  bed the night of your first shower and do not sleep with pets.  Day of Surgery  Do not apply any lotions/deoderants the morning of surgery.  Please wear clean clothes to the hospital/surgery center.     Please read over the following fact sheets that you were given. Pain Booklet, Coughing and Deep Breathing, MRSA Information and Surgical Site Infection Prevention

## 2015-05-02 MED ORDER — DEXAMETHASONE SODIUM PHOSPHATE 10 MG/ML IJ SOLN
10.0000 mg | INTRAMUSCULAR | Status: DC
  Filled 2015-05-02: qty 1

## 2015-05-02 MED ORDER — CEFAZOLIN SODIUM-DEXTROSE 2-3 GM-% IV SOLR
2.0000 g | INTRAVENOUS | Status: AC
  Administered 2015-05-03: 2 g via INTRAVENOUS
  Filled 2015-05-02: qty 50

## 2015-05-03 ENCOUNTER — Encounter (HOSPITAL_COMMUNITY): Payer: Self-pay | Admitting: *Deleted

## 2015-05-03 ENCOUNTER — Ambulatory Visit (HOSPITAL_COMMUNITY): Payer: Medicare HMO | Admitting: Certified Registered Nurse Anesthetist

## 2015-05-03 ENCOUNTER — Ambulatory Visit (HOSPITAL_COMMUNITY)
Admission: RE | Admit: 2015-05-03 | Discharge: 2015-05-04 | Disposition: A | Discharge status: Home / Self Care | Payer: Medicare HMO | Source: Ambulatory Visit | Attending: Neurological Surgery | Admitting: Neurological Surgery

## 2015-05-03 ENCOUNTER — Ambulatory Visit (HOSPITAL_COMMUNITY): Payer: Medicare HMO

## 2015-05-03 ENCOUNTER — Encounter (HOSPITAL_COMMUNITY)
Admission: RE | Disposition: A | Discharge status: Home / Self Care | Payer: Self-pay | Source: Ambulatory Visit | Attending: Neurological Surgery

## 2015-05-03 DIAGNOSIS — Z9889 Other specified postprocedural states: Secondary | ICD-10-CM

## 2015-05-03 DIAGNOSIS — M5416 Radiculopathy, lumbar region: Secondary | ICD-10-CM | POA: Diagnosis not present

## 2015-05-03 DIAGNOSIS — M5126 Other intervertebral disc displacement, lumbar region: Secondary | ICD-10-CM | POA: Diagnosis not present

## 2015-05-03 DIAGNOSIS — I10 Essential (primary) hypertension: Secondary | ICD-10-CM | POA: Insufficient documentation

## 2015-05-03 DIAGNOSIS — Z7982 Long term (current) use of aspirin: Secondary | ICD-10-CM | POA: Diagnosis not present

## 2015-05-03 DIAGNOSIS — Z6835 Body mass index (BMI) 35.0-35.9, adult: Secondary | ICD-10-CM | POA: Insufficient documentation

## 2015-05-03 DIAGNOSIS — N529 Male erectile dysfunction, unspecified: Secondary | ICD-10-CM | POA: Insufficient documentation

## 2015-05-03 DIAGNOSIS — E669 Obesity, unspecified: Secondary | ICD-10-CM | POA: Insufficient documentation

## 2015-05-03 DIAGNOSIS — Z87891 Personal history of nicotine dependence: Secondary | ICD-10-CM | POA: Insufficient documentation

## 2015-05-03 DIAGNOSIS — Z419 Encounter for procedure for purposes other than remedying health state, unspecified: Secondary | ICD-10-CM

## 2015-05-03 DIAGNOSIS — M5441 Lumbago with sciatica, right side: Secondary | ICD-10-CM

## 2015-05-03 HISTORY — PX: LUMBAR LAMINECTOMY/DECOMPRESSION MICRODISCECTOMY: SHX5026

## 2015-05-03 SURGERY — LUMBAR LAMINECTOMY/DECOMPRESSION MICRODISCECTOMY 1 LEVEL
Anesthesia: General | Site: Back | Laterality: Right

## 2015-05-03 MED ORDER — FENTANYL CITRATE (PF) 250 MCG/5ML IJ SOLN
INTRAMUSCULAR | Status: AC
  Filled 2015-05-03: qty 5

## 2015-05-03 MED ORDER — PHENYLEPHRINE HCL 10 MG/ML IJ SOLN
10.0000 mg | INTRAVENOUS | Status: DC | PRN
  Administered 2015-05-03: 20 ug/min via INTRAVENOUS

## 2015-05-03 MED ORDER — PROPOFOL 10 MG/ML IV BOLUS
INTRAVENOUS | Status: AC
  Filled 2015-05-03: qty 20

## 2015-05-03 MED ORDER — MIDAZOLAM HCL 2 MG/2ML IJ SOLN
INTRAMUSCULAR | Status: AC
  Filled 2015-05-03: qty 4

## 2015-05-03 MED ORDER — SODIUM CHLORIDE 0.9 % IJ SOLN: 3.0000 mL | INTRAMUSCULAR | Status: DC | PRN

## 2015-05-03 MED ORDER — LIDOCAINE HCL (CARDIAC) 20 MG/ML IV SOLN
INTRAVENOUS | Status: AC
  Filled 2015-05-03: qty 15

## 2015-05-03 MED ORDER — PHENYLEPHRINE HCL 10 MG/ML IJ SOLN
INTRAMUSCULAR | Status: AC
  Filled 2015-05-03: qty 1

## 2015-05-03 MED ORDER — MORPHINE SULFATE (PF) 2 MG/ML IV SOLN: 1.0000 mg | INTRAVENOUS | Status: DC | PRN

## 2015-05-03 MED ORDER — THROMBIN 5000 UNITS EX SOLR
CUTANEOUS | Status: DC | PRN
  Administered 2015-05-03 (×2): 5000 [IU] via TOPICAL

## 2015-05-03 MED ORDER — MIDAZOLAM HCL 5 MG/5ML IJ SOLN
INTRAMUSCULAR | Status: DC | PRN
  Administered 2015-05-03: 1 mg via INTRAVENOUS

## 2015-05-03 MED ORDER — DEXAMETHASONE SODIUM PHOSPHATE 4 MG/ML IJ SOLN
INTRAMUSCULAR | Status: DC | PRN
  Administered 2015-05-03: 8 mg via INTRAVENOUS

## 2015-05-03 MED ORDER — LIDOCAINE HCL (CARDIAC) 20 MG/ML IV SOLN
INTRAVENOUS | Status: DC | PRN
  Administered 2015-05-03: 60 mg via INTRAVENOUS

## 2015-05-03 MED ORDER — FENTANYL CITRATE (PF) 100 MCG/2ML IJ SOLN
INTRAMUSCULAR | Status: DC | PRN
  Administered 2015-05-03 (×2): 100 ug via INTRAVENOUS
  Administered 2015-05-03 (×3): 50 ug via INTRAVENOUS
  Administered 2015-05-03: 100 ug via INTRAVENOUS
  Administered 2015-05-03: 50 ug via INTRAVENOUS

## 2015-05-03 MED ORDER — PHENYLEPHRINE 40 MCG/ML (10ML) SYRINGE FOR IV PUSH (FOR BLOOD PRESSURE SUPPORT)
PREFILLED_SYRINGE | INTRAVENOUS | Status: AC
  Filled 2015-05-03: qty 10

## 2015-05-03 MED ORDER — BUPIVACAINE HCL (PF) 0.25 % IJ SOLN
INTRAMUSCULAR | Status: DC | PRN
  Administered 2015-05-03: 1 mL

## 2015-05-03 MED ORDER — ACETAMINOPHEN 650 MG RE SUPP: 650.0000 mg | RECTAL | Status: DC | PRN

## 2015-05-03 MED ORDER — ONDANSETRON HCL 4 MG/2ML IJ SOLN: 4.0000 mg | INTRAMUSCULAR | Status: DC | PRN

## 2015-05-03 MED ORDER — CEFAZOLIN SODIUM 1-5 GM-% IV SOLN
1.0000 g | Freq: Three times a day (TID) | INTRAVENOUS | Status: AC
  Administered 2015-05-03 – 2015-05-04 (×2): 1 g via INTRAVENOUS
  Filled 2015-05-03 (×2): qty 50

## 2015-05-03 MED ORDER — POTASSIUM CHLORIDE IN NACL 20-0.9 MEQ/L-% IV SOLN
INTRAVENOUS | Status: DC
  Administered 2015-05-03: 19:00:00 via INTRAVENOUS
  Filled 2015-05-03 (×3): qty 1000

## 2015-05-03 MED ORDER — HYDROCODONE-ACETAMINOPHEN 5-325 MG PO TABS: 1.0000 | ORAL_TABLET | ORAL | Status: DC | PRN

## 2015-05-03 MED ORDER — MEPERIDINE HCL 25 MG/ML IJ SOLN: 6.2500 mg | INTRAMUSCULAR | Status: DC | PRN

## 2015-05-03 MED ORDER — ONDANSETRON HCL 4 MG/2ML IJ SOLN
INTRAMUSCULAR | Status: DC | PRN
  Administered 2015-05-03: 4 mg via INTRAVENOUS

## 2015-05-03 MED ORDER — LACTATED RINGERS IV SOLN
INTRAVENOUS | Status: DC
  Administered 2015-05-03 (×2): via INTRAVENOUS

## 2015-05-03 MED ORDER — METHOCARBAMOL 500 MG PO TABS
500.0000 mg | ORAL_TABLET | Freq: Four times a day (QID) | ORAL | Status: DC | PRN
Start: 2015-05-03 — End: 2015-05-04

## 2015-05-03 MED ORDER — NEOSTIGMINE METHYLSULFATE 10 MG/10ML IV SOLN
INTRAVENOUS | Status: DC | PRN
  Administered 2015-05-03: 4 mg via INTRAVENOUS

## 2015-05-03 MED ORDER — PHENYLEPHRINE HCL 10 MG/ML IJ SOLN
INTRAMUSCULAR | Status: DC | PRN
  Administered 2015-05-03: 40 ug via INTRAVENOUS
  Administered 2015-05-03: 80 ug via INTRAVENOUS

## 2015-05-03 MED ORDER — HYDROMORPHONE HCL 1 MG/ML IJ SOLN
0.2500 mg | INTRAMUSCULAR | Status: DC | PRN
Start: 2015-05-03 — End: 2015-05-03

## 2015-05-03 MED ORDER — GLYCOPYRROLATE 0.2 MG/ML IJ SOLN
INTRAMUSCULAR | Status: DC | PRN
  Administered 2015-05-03: .6 mg via INTRAVENOUS

## 2015-05-03 MED ORDER — LIDOCAINE HCL 4 % MT SOLN
OROMUCOSAL | Status: DC | PRN
  Administered 2015-05-03: 4 mL via TOPICAL

## 2015-05-03 MED ORDER — MENTHOL 3 MG MT LOZG
1.0000 | LOZENGE | OROMUCOSAL | Status: DC | PRN
  Administered 2015-05-03: 3 mg via ORAL
  Filled 2015-05-03: qty 9

## 2015-05-03 MED ORDER — 0.9 % SODIUM CHLORIDE (POUR BTL) OPTIME
TOPICAL | Status: DC | PRN
  Administered 2015-05-03: 1000 mL

## 2015-05-03 MED ORDER — LIDOCAINE HCL (CARDIAC) 20 MG/ML IV SOLN
INTRAVENOUS | Status: AC
  Filled 2015-05-03: qty 5

## 2015-05-03 MED ORDER — PROPOFOL 10 MG/ML IV BOLUS
INTRAVENOUS | Status: DC | PRN
  Administered 2015-05-03: 200 mg via INTRAVENOUS

## 2015-05-03 MED ORDER — ONDANSETRON HCL 4 MG/2ML IJ SOLN
INTRAMUSCULAR | Status: AC
  Filled 2015-05-03: qty 2

## 2015-05-03 MED ORDER — PHENOL 1.4 % MT LIQD: 1.0000 | OROMUCOSAL | Status: DC | PRN

## 2015-05-03 MED ORDER — SODIUM CHLORIDE 0.9 % IR SOLN
Status: DC | PRN
  Administered 2015-05-03: 16:00:00

## 2015-05-03 MED ORDER — METHOCARBAMOL 1000 MG/10ML IJ SOLN
500.0000 mg | Freq: Four times a day (QID) | INTRAVENOUS | Status: DC | PRN
  Filled 2015-05-03: qty 5

## 2015-05-03 MED ORDER — ACETAMINOPHEN 325 MG PO TABS: 650.0000 mg | ORAL_TABLET | ORAL | Status: DC | PRN

## 2015-05-03 MED ORDER — SODIUM CHLORIDE 0.9 % IJ SOLN
3.0000 mL | Freq: Two times a day (BID) | INTRAMUSCULAR | Status: DC
  Administered 2015-05-03: 3 mL via INTRAVENOUS

## 2015-05-03 MED ORDER — ROCURONIUM BROMIDE 100 MG/10ML IV SOLN
INTRAVENOUS | Status: DC | PRN
  Administered 2015-05-03: 10 mg via INTRAVENOUS
  Administered 2015-05-03: 40 mg via INTRAVENOUS

## 2015-05-03 MED ORDER — HEMOSTATIC AGENTS (NO CHARGE) OPTIME
TOPICAL | Status: DC | PRN
  Administered 2015-05-03: 1 via TOPICAL

## 2015-05-03 MED ORDER — PROMETHAZINE HCL 25 MG/ML IJ SOLN: 6.2500 mg | INTRAMUSCULAR | Status: DC | PRN

## 2015-05-03 MED ORDER — METHYLPREDNISOLONE ACETATE 80 MG/ML IJ SUSP
INTRAMUSCULAR | Status: DC | PRN
  Administered 2015-05-03: 80 mg

## 2015-05-03 SURGICAL SUPPLY — 41 items
BAG DECANTER FOR FLEXI CONT (MISCELLANEOUS) ×2 IMPLANT
BENZOIN TINCTURE PRP APPL 2/3 (GAUZE/BANDAGES/DRESSINGS) ×2 IMPLANT
BUR MATCHSTICK NEURO 3.0 LAGG (BURR) ×2 IMPLANT
CANISTER SUCT 3000ML PPV (MISCELLANEOUS) ×2 IMPLANT
DRAPE LAPAROTOMY 100X72X124 (DRAPES) ×2 IMPLANT
DRAPE MICROSCOPE LEICA (MISCELLANEOUS) ×2 IMPLANT
DRAPE POUCH INSTRU U-SHP 10X18 (DRAPES) ×2 IMPLANT
DRAPE SURG 17X23 STRL (DRAPES) ×2 IMPLANT
DRSG OPSITE POSTOP 3X4 (GAUZE/BANDAGES/DRESSINGS) ×2 IMPLANT
DURAPREP 26ML APPLICATOR (WOUND CARE) ×2 IMPLANT
ELECT REM PT RETURN 9FT ADLT (ELECTROSURGICAL) ×2
ELECTRODE REM PT RTRN 9FT ADLT (ELECTROSURGICAL) ×1 IMPLANT
GAUZE SPONGE 4X4 16PLY XRAY LF (GAUZE/BANDAGES/DRESSINGS) IMPLANT
GLOVE BIO SURGEON STRL SZ8 (GLOVE) ×2 IMPLANT
GOWN STRL REUS W/ TWL LRG LVL3 (GOWN DISPOSABLE) IMPLANT
GOWN STRL REUS W/ TWL XL LVL3 (GOWN DISPOSABLE) ×1 IMPLANT
GOWN STRL REUS W/TWL 2XL LVL3 (GOWN DISPOSABLE) IMPLANT
GOWN STRL REUS W/TWL LRG LVL3 (GOWN DISPOSABLE)
GOWN STRL REUS W/TWL XL LVL3 (GOWN DISPOSABLE) ×1
HEMOSTAT POWDER KIT SURGIFOAM (HEMOSTASIS) IMPLANT
KIT BASIN OR (CUSTOM PROCEDURE TRAY) ×2 IMPLANT
KIT ROOM TURNOVER OR (KITS) ×2 IMPLANT
NEEDLE HYPO 18GX1.5 BLUNT FILL (NEEDLE) ×2 IMPLANT
NEEDLE HYPO 21X1.5 SAFETY (NEEDLE) ×2 IMPLANT
NEEDLE HYPO 25X1 1.5 SAFETY (NEEDLE) ×2 IMPLANT
NEEDLE SPNL 20GX3.5 QUINCKE YW (NEEDLE) IMPLANT
NEEDLE SPNL 22GX3.5 QUINCKE BK (NEEDLE) ×2 IMPLANT
NS IRRIG 1000ML POUR BTL (IV SOLUTION) ×2 IMPLANT
PACK LAMINECTOMY NEURO (CUSTOM PROCEDURE TRAY) ×2 IMPLANT
PAD ARMBOARD 7.5X6 YLW CONV (MISCELLANEOUS) ×6 IMPLANT
RUBBERBAND STERILE (MISCELLANEOUS) ×4 IMPLANT
SPONGE SURGIFOAM ABS GEL SZ50 (HEMOSTASIS) ×2 IMPLANT
STRIP CLOSURE SKIN 1/2X4 (GAUZE/BANDAGES/DRESSINGS) ×2 IMPLANT
SUT VIC AB 0 CT1 18XCR BRD8 (SUTURE) ×1 IMPLANT
SUT VIC AB 0 CT1 8-18 (SUTURE) ×1
SUT VIC AB 2-0 CP2 18 (SUTURE) ×2 IMPLANT
SUT VIC AB 3-0 SH 8-18 (SUTURE) ×2 IMPLANT
SYR 3ML LL SCALE MARK (SYRINGE) ×2 IMPLANT
TOWEL OR 17X24 6PK STRL BLUE (TOWEL DISPOSABLE) ×2 IMPLANT
TOWEL OR 17X26 10 PK STRL BLUE (TOWEL DISPOSABLE) ×2 IMPLANT
WATER STERILE IRR 1000ML POUR (IV SOLUTION) ×2 IMPLANT

## 2015-05-03 NOTE — Op Note (Signed)
05/03/2015  4:08 PM  PATIENT:  Timothy Duncan  74 y.o. 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 MRI showed a herniated disc at L4-5 on the right. He tried medical management without relief. I recommended microdiscectomy. Patient understood the risks, benefits, and alternatives and potential outcomes and wished to proceed.  PROCEDURE DETAILS: The patient was taken to the operating room and after induction of adequate generalized endotracheal anesthesia, the patient was rolled into the prone position on the Wilson frame and all pressure points were padded. The lumbar region was cleaned and then prepped with DuraPrep and draped in the usual sterile fashion. 5 cc of local anesthesia was injected and then a dorsal midline incision was made and carried down to the lumbo sacral fascia. The fascia was opened and the paraspinous musculature was taken down in a subperiosteal fashion to expose L4-5 on the right. Intraoperative x-ray confirmed my level, and then I used a combination of the high-speed drill and the Kerrison punches to perform a hemilaminectomy, medial facetectomy, and foraminotomy at L4-5 on the right. The underlying yellow ligament was opened and removed in a piecemeal fashion to expose the underlying dura and exiting nerve root. I undercut the lateral recess and dissected down until I was medial to and distal to the pedicle. The nerve root was well decompressed. We then gently retracted  the nerve root medially with a retractor, coagulated the epidural venous vasculature, and incised the disc space. A performed a thorough intradiscal discectomy with pituitary rongeurs and curettes, until I had a nice decompression of the nerve root and the midline. I then palpated with a coronary dilator along the nerve root and into the foramen to assure adequate decompression. I felt no more compression of the nerve root. I irrigated with saline solution containing bacitracin. Achieved hemostasis with bipolar cautery, lined the dura with Gelfoam, and then closed the fascia with 0 Vicryl. I closed the subcutaneous tissues with 2-0 Vicryl and the subcuticular tissues with 3-0 Vicryl. The skin was then closed with benzoin and Steri-Strips. The drapes were removed, a sterile dressing was applied. The patient was awakened from general anesthesia and transferred to the recovery room in stable condition. At the end of the procedure all sponge, needle and instrument counts were correct.   PLAN OF CARE: Admit for overnight observation  PATIENT DISPOSITION:  PACU - hemodynamically stable.   Delay start of Pharmacological VTE agent (>24hrs) due to surgical blood loss or risk of bleeding:  yes

## 2015-05-03 NOTE — Anesthesia Procedure Notes (Signed)
Procedure Name: Intubation Date/Time: 05/03/2015 3:14 PM Performed by: Merdis Delay Pre-anesthesia Checklist: Patient identified, Emergency Drugs available, Suction available, Patient being monitored and Timeout performed Patient Re-evaluated:Patient Re-evaluated prior to inductionOxygen Delivery Method: Circle system utilized Preoxygenation: Pre-oxygenation with 100% oxygen Intubation Type: IV induction Ventilation: Mask ventilation without difficulty and Oral airway inserted - appropriate to patient size Laryngoscope Size: Mac and 4 Grade View: Grade I Tube type: Oral Tube size: 8.0 mm Number of attempts: 1 Airway Equipment and Method: LTA kit utilized Placement Confirmation: ETT inserted through vocal cords under direct vision,  breath sounds checked- equal and bilateral,  positive ETCO2 and CO2 detector Secured at: 22 cm Tube secured with: Tape Dental Injury: Teeth and Oropharynx as per pre-operative assessment

## 2015-05-03 NOTE — Transfer of Care (Signed)
Immediate Anesthesia Transfer of Care Note  Patient: Timothy Duncan  Procedure(s) Performed: Procedure(s): Microdiscectomy - Lumbar four-Lumbar five - right  (Right)  Patient Location: PACU  Anesthesia Type:General  Level of Consciousness: awake, alert  and oriented  Airway & Oxygen Therapy: Patient Spontanous Breathing and Patient connected to nasal cannula oxygen  Post-op Assessment: Report given to RN and Post -op Vital signs reviewed and stable  Post vital signs: Reviewed and stable  Last Vitals:  Filed Vitals:   05/03/15 1150  BP: 148/71  Pulse: 76  Temp: 36.6 C  Resp: 16    Complications: No apparent anesthesia complications

## 2015-05-03 NOTE — Anesthesia Preprocedure Evaluation (Addendum)
Anesthesia Evaluation  Patient identified by MRN, date of birth, ID band Patient awake    Reviewed: Allergy & Precautions, NPO status , Patient's Chart, lab work & pertinent test results  Airway Mallampati: II  TM Distance: >3 FB Neck ROM: Full    Dental no notable dental hx. (+) Teeth Intact, Dental Advisory Given   Pulmonary neg pulmonary ROS, former smoker,    Pulmonary exam normal breath sounds clear to auscultation       Cardiovascular hypertension, Pt. on medications Normal cardiovascular exam Rhythm:Regular Rate:Normal     Neuro/Psych negative neurological ROS  negative psych ROS   GI/Hepatic negative GI ROS, Neg liver ROS,   Endo/Other  negative endocrine ROS  Renal/GU negative Renal ROS     Musculoskeletal negative musculoskeletal ROS (+)   Abdominal (+) + obese,   Peds  Hematology negative hematology ROS (+)   Anesthesia Other Findings   Reproductive/Obstetrics                            Anesthesia Physical Anesthesia Plan  ASA: II  Anesthesia Plan: General   Post-op Pain Management:    Induction: Intravenous  Airway Management Planned: Oral ETT  Additional Equipment:   Intra-op Plan:   Post-operative Plan: Extubation in OR  Informed Consent: I have reviewed the patients History and Physical, chart, labs and discussed the procedure including the risks, benefits and alternatives for the proposed anesthesia with the patient or authorized representative who has indicated his/her understanding and acceptance.   Dental advisory given  Plan Discussed with: CRNA  Anesthesia Plan Comments:         Anesthesia Quick Evaluation

## 2015-05-03 NOTE — Anesthesia Postprocedure Evaluation (Signed)
  Anesthesia Post-op Note  Patient: Timothy Duncan  Procedure(s) Performed: Procedure(s): Microdiscectomy - Lumbar four-Lumbar five - right  (Right)  Patient Location: PACU  Anesthesia Type:General  Level of Consciousness: awake and alert   Airway and Oxygen Therapy: Patient Spontanous Breathing  Post-op Pain: Controlled  Post-op Assessment: Post-op Vital signs reviewed, Patient's Cardiovascular Status Stable and Respiratory Function Stable  Post-op Vital Signs: Reviewed  Filed Vitals:   05/03/15 1645  BP: 166/81  Pulse: 88  Temp:   Resp: 20    Complications: No apparent anesthesia complications

## 2015-05-03 NOTE — H&P (Signed)
Subjective: Patient is a 74 y.o. male admitted for R leg pain. Onset of symptoms was several months ago, gradually worsening since that time.  The pain is rated severe, and is located at the across the lower back and radiates to RLE. The pain is described as aching and occurs all day. The symptoms have been progressive. Symptoms are exacerbated by exercise. MRI or CT showed R L4-5 HNP   Past Medical History  Diagnosis Date  . Hypertension   . ED (erectile dysfunction)     Past Surgical History  Procedure Laterality Date  . Cataract extraction    . Appendectomy    . Tonsillectomy    . Colonoscopy      Prior to Admission medications   Medication Sig Start Date End Date Taking? Authorizing Provider  aspirin 325 MG tablet Take 325 mg by mouth daily.     Yes Historical Provider, MD  losartan-hydrochlorothiazide Konrad Penta) 50-12.5 MG per tablet Take 1/2 tablet by mouth in the morning 08/01/14  Yes Dorena Cookey, MD  Omega-3 350 MG CAPS Take by mouth daily.     Yes Historical Provider, MD  benzonatate (TESSALON) 200 MG capsule Take 1 capsule (200 mg total) by mouth 3 (three) times daily as needed for cough. Patient not taking: Reported on 11/21/2014 11/16/14   Dorothyann Peng, NP  cefUROXime (CEFTIN) 500 MG tablet Take 1 tablet (500 mg total) by mouth 2 (two) times daily with a meal. Patient not taking: Reported on 04/24/2015 11/21/14   Dorothyann Peng, NP  HYDROcodone-acetaminophen (NORCO) 10-325 MG tablet Take 1 tablet by mouth every 8 (eight) hours as needed. Patient not taking: Reported on 04/24/2015 04/16/15   Dorothyann Peng, NP  HYDROcodone-homatropine [facility]) 5-1.5 MG/5ML syrup Take 5 mLs by mouth every 6 (six) hours as needed for cough. Patient not taking: Reported on 04/24/2015 11/21/14   Dorothyann Peng, NP  sildenafil (VIAGRA) 100 MG tablet Take 1 tablet (100 mg total) by mouth as needed for erectile dysfunction. Patient not taking: Reported on 04/24/2015 08/01/14 08/01/15  Dorena Cookey, MD   tiZANidine (ZANAFLEX) 4 MG tablet Take 1 tablet (4 mg total) by mouth every 6 (six) hours as needed for muscle spasms. Patient not taking: Reported on 04/24/2015 03/26/15   Dorothyann Peng, NP   No Known Allergies  Social History  Substance Use Topics  . Smoking status: Former Research scientist (life sciences)  . Smokeless tobacco: Never Used  . Alcohol Use: No    History reviewed. No pertinent family history.   Review of Systems  Positive ROS: neg  All other systems have been reviewed and were otherwise negative with the exception of those mentioned in the HPI and as above.  Objective: Vital signs in last 24 hours: Temp:  [97.8 F (36.6 C)] 97.8 F (36.6 C) (11/03 1150) Pulse Rate:  [76] 76 (11/03 1150) Resp:  [16] 16 (11/03 1150) BP: (148)/(71) 148/71 mmHg (11/03 1150) SpO2:  [94 %] 94 % (11/03 1150)  General Appearance: Alert, cooperative, no distress, appears stated age Head: Normocephalic, without obvious abnormality, atraumatic Eyes: PERRL, conjunctiva/corneas clear, EOM's intact    Neck: Supple, symmetrical, trachea midline Back: Symmetric, no curvature, ROM normal, no CVA tenderness Lungs:  respirations unlabored Heart: Regular rate and rhythm Abdomen: Soft, non-tender Extremities: Extremities normal, atraumatic, no cyanosis or edema Pulses: 2+ and symmetric all extremities Skin: Skin color, texture, turgor normal, no rashes or lesions  NEUROLOGIC:   Mental status: Alert and oriented x4,  no aphasia, good attention span, fund of  knowledge, and memory Motor Exam - grossly normal Sensory Exam - grossly normal Reflexes: 1+ Coordination - grossly normal Gait - grossly normal Balance - grossly normal Cranial Nerves: I: smell Not tested  II: visual acuity  OS: nl    OD: nl  II: visual fields Full to confrontation  II: pupils Equal, round, reactive to light  III,VII: ptosis None  III,IV,VI: extraocular muscles  Full ROM  V: mastication Normal  V: facial light touch sensation  Normal   V,VII: corneal reflex  Present  VII: facial muscle function - upper  Normal  VII: facial muscle function - lower Normal  VIII: hearing Not tested  IX: soft palate elevation  Normal  IX,X: gag reflex Present  XI: trapezius strength  5/5  XI: sternocleidomastoid strength 5/5  XI: neck flexion strength  5/5  XII: tongue strength  Normal    Data Review Lab Results  Component Value Date   WBC 9.8 04/25/2015   HGB 15.0 04/25/2015   HCT 45.8 04/25/2015   MCV 92.9 04/25/2015   PLT 218 04/25/2015   Lab Results  Component Value Date   NA 142 04/25/2015   K 4.1 04/25/2015   CL 109 04/25/2015   CO2 23 04/25/2015   BUN 10 04/25/2015   CREATININE 1.00 04/25/2015   GLUCOSE 104* 04/25/2015   Lab Results  Component Value Date   INR 1.04 04/25/2015    Assessment/Plan: Patient admitted for R L4-5 micro. Patient has failed a reasonable attempt at conservative therapy.  I explained the condition and procedure to the patient and answered any questions.  Patient wishes to proceed with procedure as planned. Understands risks/ benefits and typical outcomes of procedure.   Suzie Vandam S 05/03/2015 2:42 PM

## 2015-05-04 ENCOUNTER — Encounter (HOSPITAL_COMMUNITY): Payer: Self-pay | Admitting: Neurological Surgery

## 2015-05-04 DIAGNOSIS — M5126 Other intervertebral disc displacement, lumbar region: Secondary | ICD-10-CM | POA: Diagnosis not present

## 2015-05-04 MED ORDER — HYDROCODONE-ACETAMINOPHEN 10-325 MG PO TABS: 1.0000 | ORAL_TABLET | Freq: Three times a day (TID) | ORAL | Status: DC | PRN

## 2015-05-04 NOTE — Discharge Summary (Signed)
Physician Discharge Summary  Patient ID: Timothy Duncan MRN: [id number] DOB/AGE: 1941-01-06 74 y.o.  Admit date: 05/03/2015 Discharge date: 05/04/2015  Admission Diagnoses: L4-L5 stenosis with disc herniation    Discharge Diagnoses: Same   Discharged Condition: good  Hospital Course: The patient was admitted on 05/03/2015 and taken to the operating room where the patient underwent right L4-5 decompression and discectomy. The patient tolerated the procedure well and was taken to the recovery room and then to the floor in stable condition. The hospital course was routine. There were no complications. The wound remained clean dry and intact. Pt had appropriate back soreness. No complaints of leg pain or new N/T/W. The patient remained afebrile with stable vital signs, and tolerated a regular diet. The patient continued to increase activities, and pain was well controlled with oral pain medications.   Consults: None  Significant Diagnostic Studies:  Results for orders placed or performed during the hospital encounter of 04/25/15  Surgical pcr screen  Result Value Ref Range   MRSA, PCR NEGATIVE NEGATIVE   Staphylococcus aureus NEGATIVE NEGATIVE  Basic metabolic panel  Result Value Ref Range   Sodium 142 135 - 145 mmol/L   Potassium 4.1 3.5 - 5.1 mmol/L   Chloride 109 101 - 111 mmol/L   CO2 23 22 - 32 mmol/L   Glucose, Bld 104 (H) 65 - 99 mg/dL   BUN 10 6 - 20 mg/dL   Creatinine, Ser 1.00 0.61 - 1.24 mg/dL   Calcium 9.3 8.9 - 10.3 mg/dL   GFR calc non Af Amer >60 >60 mL/min   GFR calc Af Amer >60 >60 mL/min   Anion gap 10 5 - 15  CBC WITH DIFFERENTIAL  Result Value Ref Range   WBC 9.8 4.0 - 10.5 K/uL   RBC 4.93 4.22 - 5.81 MIL/uL   Hemoglobin 15.0 13.0 - 17.0 g/dL   HCT 45.8 39.0 - 52.0 %   MCV 92.9 78.0 - 100.0 fL   MCH 30.4 26.0 - 34.0 pg   MCHC 32.8 30.0 - 36.0 g/dL   RDW 13.6 11.5 - 15.5 %   Platelets 218 150 - 400 K/uL   Neutrophils Relative % 68 %   Neutro Abs 6.7 1.7  - 7.7 K/uL   Lymphocytes Relative 23 %   Lymphs Abs 2.3 0.7 - 4.0 K/uL   Monocytes Relative 7 %   Monocytes Absolute 0.6 0.1 - 1.0 K/uL   Eosinophils Relative 2 %   Eosinophils Absolute 0.2 0.0 - 0.7 K/uL   Basophils Relative 0 %   Basophils Absolute 0.0 0.0 - 0.1 K/uL  Protime-INR  Result Value Ref Range   Prothrombin Time 13.8 11.6 - 15.2 seconds   INR 1.04 0.00 - 1.49    Dg Lumbar Spine 2-3 Views  05/03/2015  CLINICAL DATA:  Lumbar disc herniation. EXAM: LUMBAR SPINE - 2-3 VIEW COMPARISON:  None. FINDINGS: First intraoperative cross-table lateral radiograph shows a needle posteriorly overlying the posterior elements of the level of L5. Second intraoperative cross-table lateral radiograph shows posterior retractors with a probe overlying the interspinous space at L4-5. IMPRESSION: Intraoperative localization of L4-5 interspinous space. Electronically Signed   By: Earle Gell M.D.   On: 05/03/2015 15:57    Antibiotics:  Anti-infectives    Start     Dose/Rate Route Frequency Ordered Stop   05/03/15 2000  ceFAZolin (ANCEF) IVPB 1 g/50 mL premix     1 g 100 mL/hr over 30 Minutes Intravenous Every 8 hours 05/03/15 1820  05/04/15 0434   05/03/15 1534  bacitracin 50,000 Units in sodium chloride irrigation 0.9 % 500 mL irrigation  Status:  Discontinued       As needed 05/03/15 1534 05/03/15 1613   05/03/15 1330  ceFAZolin (ANCEF) IVPB 2 g/50 mL premix     2 g 100 mL/hr over 30 Minutes Intravenous To ShortStay Surgical 05/02/15 1425 05/03/15 1504      Discharge Exam: Blood pressure 138/66, pulse 92, temperature 97.6 F (36.4 C), temperature source Oral, resp. rate 18, SpO2 93 %. Neurologic: Grossly normal Dressing dry  Discharge Medications:     Medication List    TAKE these medications        aspirin 325 MG tablet  Take 325 mg by mouth daily.     benzonatate 200 MG capsule  Commonly known as:  TESSALON  Take 1 capsule (200 mg total) by mouth 3 (three) times daily as needed  for cough.     cefUROXime 500 MG tablet  Commonly known as:  CEFTIN  Take 1 tablet (500 mg total) by mouth 2 (two) times daily with a meal.     HYDROcodone-acetaminophen 10-325 MG tablet  Commonly known as:  NORCO  Take 1 tablet by mouth every 8 (eight) hours as needed.     HYDROcodone-homatropine 5-1.5 MG/5ML syrup  Commonly known as:  HYCODAN  Take 5 mLs by mouth every 6 (six) hours as needed for cough.     losartan-hydrochlorothiazide 50-12.5 MG tablet  Commonly known as:  HYZAAR  Take 1/2 tablet by mouth in the morning     Omega-3 350 MG Caps  Take by mouth daily.     sildenafil 100 MG tablet  Commonly known as:  VIAGRA  Take 1 tablet (100 mg total) by mouth as needed for erectile dysfunction.     tiZANidine 4 MG tablet  Commonly known as:  ZANAFLEX  Take 1 tablet (4 mg total) by mouth every 6 (six) hours as needed for muscle spasms.        Disposition: Home   Final Dx: Right L4-5 decompression and discectomy      Discharge Instructions     Remove dressing in 72 hours    Complete by:  As directed      Call MD for:  difficulty breathing, headache or visual disturbances    Complete by:  As directed      Call MD for:  persistant nausea and vomiting    Complete by:  As directed      Call MD for:  redness, tenderness, or signs of infection (pain, swelling, redness, odor or green/yellow discharge around incision site)    Complete by:  As directed      Call MD for:  severe uncontrolled pain    Complete by:  As directed      Call MD for:  temperature >100.4    Complete by:  As directed      Diet - low sodium heart healthy    Complete by:  As directed      Discharge instructions    Complete by:  As directed   No strenuous activity, no bending or twisting, may shower, no driving     Increase activity slowly    Complete by:  As directed            Follow-up Information    Follow up with Maryan Sivak S, MD. Schedule an appointment as soon as possible for a visit  in 3 weeks.  Specialty:  Neurosurgery   Contact information:   [address] [place] [address] [phone]        Signed: Eustace Moore 05/04/2015, 9:48 AM

## 2015-05-04 NOTE — Care Management Note (Signed)
Case Management Note  Patient Details  Name: Timothy Duncan MRN: [id number] Date of Birth: 10-03-1940  Subjective/Objective:     74 y.o. M admitted 05/03/2015 Right L4-5 hematoma laminectomy medial facetectomy foraminotomy followed by L4-L5 microdiscectomy utilizing microscopic dissection. Lives in private residence with family support                 Action/Plan: Discharge   Expected Discharge Date:                  Expected Discharge Plan:  Home/Self Care  In-House Referral:     Discharge planning Services  CM Consult  Post Acute Care Choice:    Choice offered to:     DME Arranged:    DME Agency:     HH Arranged:    [place] Agency:     Status of Service:  Completed, signed off  Medicare Important Message Given:    Date Medicare IM Given:    Medicare IM give by:    Date Additional Medicare IM Given:    Additional Medicare Important Message give by:     If discussed at [place] of Stay Meetings, dates discussed:    Additional Comments: No CM Needs. Will sign Off.   Delrae Sawyers, RN 05/04/2015, 10:44 AM

## 2015-05-04 NOTE — Progress Notes (Signed)
Patient alert and oriented, mae's well, voiding adequate amount of urine, swallowing without difficulty, no c/o pain. Patient discharged home with family. Script and discharged instructions given to patient. Patient and family stated understanding of d/c instructions given and has an appointment with MD. 

## 2015-05-07 ENCOUNTER — Telehealth: Payer: Self-pay | Admitting: Family Medicine

## 2015-05-07 DIAGNOSIS — K5900 Constipation, unspecified: Secondary | ICD-10-CM | POA: Diagnosis not present

## 2015-05-07 NOTE — Telephone Encounter (Signed)
Spoke to pt, told him Dr. Sherren Mocha is out of the office and I would suggest that you call your back surgeon to see if they can prescribe something for you. Pt said he is over at the [facility] right now. Told pt okay just wanted to make sure you were being taken care of.

## 2015-05-07 NOTE — Telephone Encounter (Signed)
[facility]  Patient Name: Timothy Duncan  DOB: 15-Nov-1940    Initial Comment Caller states that was on hydrocodone because of back surgery on thursday and is constipated because of it and needs a laxative.    Nurse Assessment  Nurse: Harlow Mares, RN, Suanne Marker Date/Time Eilene Ghazi Time): 05/07/2015 3:43:13 PM  Confirm and document reason for call. If symptomatic, describe symptoms. ---Caller states that was on hydrocodone because of back surgery last Thursday and is constipated because of it and needs a laxative. Last BM was couple of times today, he has some dribbling, and abd cramping. Reports that he is no longer taking hydrocodone.  Has the patient traveled out of the country within the last 30 days? ---No  Does the patient have any new or worsening symptoms? ---Yes  Will a triage be completed? ---Yes  Related visit to physician within the last 2 weeks? ---Yes  Does the PT have any chronic conditions? (i.e. diabetes, asthma, etc.) ---No     Guidelines    Guideline Title Affirmed Question Affirmed Notes  Constipation [1] Constant abdominal pain AND [2] present > 2 hours    Final Disposition User   See Physician within 4 Hours (or PCP triage) Harlow Mares, RN, Rhonda    Referrals  GO TO [place] UNDECIDED   Disagree/Comply: Comply

## 2015-05-29 DIAGNOSIS — M5126 Other intervertebral disc displacement, lumbar region: Secondary | ICD-10-CM | POA: Diagnosis not present

## 2015-05-31 DIAGNOSIS — M5126 Other intervertebral disc displacement, lumbar region: Secondary | ICD-10-CM | POA: Diagnosis not present

## 2015-06-04 ENCOUNTER — Other Ambulatory Visit (HOSPITAL_COMMUNITY): Payer: Self-pay | Admitting: Neurological Surgery

## 2015-06-04 DIAGNOSIS — S064X9A Epidural hemorrhage with loss of consciousness of unspecified duration, initial encounter: Secondary | ICD-10-CM | POA: Diagnosis not present

## 2015-06-05 ENCOUNTER — Encounter (HOSPITAL_COMMUNITY): Payer: Self-pay | Admitting: *Deleted

## 2015-06-05 NOTE — Progress Notes (Signed)
Pt denies SOB, chest pain, and being under the care of a cardiologist. Pt stated that he may have had some cardiac test done > 30 years ago. Pt made aware to stop taking Aspirin, vitamins. fish oil, and herbal medications. Do not take any NSAIDs ie: Ibuprofen, Advil, Naproxen or any medication containing Aspirin.Pt verbalized understanding of all pre-op instructions.

## 2015-06-06 ENCOUNTER — Encounter (HOSPITAL_COMMUNITY): Payer: Self-pay | Admitting: *Deleted

## 2015-06-06 ENCOUNTER — Inpatient Hospital Stay (HOSPITAL_COMMUNITY): Payer: Medicare HMO | Admitting: Anesthesiology

## 2015-06-06 ENCOUNTER — Inpatient Hospital Stay (HOSPITAL_COMMUNITY)
Admission: RE | Admit: 2015-06-06 | Discharge: 2015-06-08 | DRG: 520 | Disposition: A | Discharge status: Home Health | Payer: Medicare HMO | Source: Ambulatory Visit | Attending: Neurological Surgery | Admitting: Neurological Surgery

## 2015-06-06 ENCOUNTER — Encounter (HOSPITAL_COMMUNITY)
Admission: RE | Disposition: A | Discharge status: Home Health | Payer: Self-pay | Source: Ambulatory Visit | Attending: Neurological Surgery

## 2015-06-06 DIAGNOSIS — Z87891 Personal history of nicotine dependence: Secondary | ICD-10-CM | POA: Diagnosis not present

## 2015-06-06 DIAGNOSIS — Z9889 Other specified postprocedural states: Secondary | ICD-10-CM | POA: Diagnosis not present

## 2015-06-06 DIAGNOSIS — K5903 Drug induced constipation: Secondary | ICD-10-CM | POA: Diagnosis present

## 2015-06-06 DIAGNOSIS — I10 Essential (primary) hypertension: Secondary | ICD-10-CM | POA: Diagnosis not present

## 2015-06-06 DIAGNOSIS — G061 Intraspinal abscess and granuloma: Secondary | ICD-10-CM | POA: Diagnosis not present

## 2015-06-06 DIAGNOSIS — M5441 Lumbago with sciatica, right side: Secondary | ICD-10-CM

## 2015-06-06 DIAGNOSIS — M4646 Discitis, unspecified, lumbar region: Principal | ICD-10-CM | POA: Diagnosis present

## 2015-06-06 DIAGNOSIS — M549 Dorsalgia, unspecified: Secondary | ICD-10-CM | POA: Diagnosis not present

## 2015-06-06 DIAGNOSIS — Z23 Encounter for immunization: Secondary | ICD-10-CM

## 2015-06-06 DIAGNOSIS — K5900 Constipation, unspecified: Secondary | ICD-10-CM | POA: Diagnosis not present

## 2015-06-06 DIAGNOSIS — G062 Extradural and subdural abscess, unspecified: Secondary | ICD-10-CM | POA: Diagnosis not present

## 2015-06-06 HISTORY — PX: WOUND EXPLORATION: SHX6188

## 2015-06-06 LAB — GRAM STAIN

## 2015-06-06 LAB — CBC
HEMATOCRIT: 41.5 % (ref 39.0–52.0)
Hemoglobin: 13.6 g/dL (ref 13.0–17.0)
MCH: 30.2 pg (ref 26.0–34.0)
MCHC: 32.8 g/dL (ref 30.0–36.0)
MCV: 92 fL (ref 78.0–100.0)
Platelets: 389 10*3/uL (ref 150–400)
RBC: 4.51 MIL/uL (ref 4.22–5.81)
RDW: 13.6 % (ref 11.5–15.5)
WBC: 11.6 10*3/uL — AB (ref 4.0–10.5)

## 2015-06-06 LAB — BASIC METABOLIC PANEL
ANION GAP: 11 (ref 5–15)
BUN: 10 mg/dL (ref 6–20)
CALCIUM: 9.6 mg/dL (ref 8.9–10.3)
CO2: 23 mmol/L (ref 22–32)
Chloride: 107 mmol/L (ref 101–111)
Creatinine, Ser: 0.86 mg/dL (ref 0.61–1.24)
GFR calc Af Amer: >60 mL/min (ref >60)
GFR calc non Af Amer: >60 mL/min (ref >60)
GLUCOSE: 98 mg/dL (ref 65–99)
Potassium: 3.9 mmol/L (ref 3.5–5.1)
Sodium: 141 mmol/L (ref 135–145)

## 2015-06-06 LAB — SURGICAL PCR SCREEN
MRSA, PCR: NEGATIVE
Staphylococcus aureus: NEGATIVE

## 2015-06-06 SURGERY — WOUND EXPLORATION
Anesthesia: General | Site: Back

## 2015-06-06 MED ORDER — ONDANSETRON HCL 4 MG/2ML IJ SOLN: 4.0000 mg | Freq: Once | INTRAMUSCULAR | Status: DC | PRN

## 2015-06-06 MED ORDER — VANCOMYCIN HCL IN DEXTROSE 1-5 GM/200ML-% IV SOLN
1000.0000 mg | Freq: Two times a day (BID) | INTRAVENOUS | Status: DC
  Administered 2015-06-07: 1000 mg via INTRAVENOUS
  Filled 2015-06-06 (×2): qty 200

## 2015-06-06 MED ORDER — SENNA 8.6 MG PO TABS
1.0000 | ORAL_TABLET | Freq: Two times a day (BID) | ORAL | Status: DC
  Administered 2015-06-06 – 2015-06-07 (×3): 8.6 mg via ORAL
  Filled 2015-06-06 (×4): qty 1

## 2015-06-06 MED ORDER — METHOCARBAMOL 500 MG PO TABS
500.0000 mg | ORAL_TABLET | Freq: Four times a day (QID) | ORAL | Status: DC | PRN
  Filled 2015-06-06: qty 1

## 2015-06-06 MED ORDER — ACETAMINOPHEN 650 MG RE SUPP: 650.0000 mg | RECTAL | Status: DC | PRN

## 2015-06-06 MED ORDER — PHENYLEPHRINE 40 MCG/ML (10ML) SYRINGE FOR IV PUSH (FOR BLOOD PRESSURE SUPPORT)
PREFILLED_SYRINGE | INTRAVENOUS | Status: AC
  Filled 2015-06-06: qty 10

## 2015-06-06 MED ORDER — HYDROCODONE-ACETAMINOPHEN 7.5-325 MG PO TABS: 1.0000 | ORAL_TABLET | Freq: Once | ORAL | Status: DC | PRN

## 2015-06-06 MED ORDER — VANCOMYCIN HCL 1000 MG IV SOLR
INTRAVENOUS | Status: AC
  Filled 2015-06-06: qty 1000

## 2015-06-06 MED ORDER — ACETAMINOPHEN 325 MG PO TABS: 650.0000 mg | ORAL_TABLET | ORAL | Status: DC | PRN

## 2015-06-06 MED ORDER — CEFTRIAXONE SODIUM 1 G IJ SOLR
1.0000 g | INTRAMUSCULAR | Status: DC
  Filled 2015-06-06: qty 10

## 2015-06-06 MED ORDER — ONDANSETRON HCL 4 MG/2ML IJ SOLN: 4.0000 mg | INTRAMUSCULAR | Status: DC | PRN

## 2015-06-06 MED ORDER — SUGAMMADEX SODIUM 200 MG/2ML IV SOLN
INTRAVENOUS | Status: AC
  Filled 2015-06-06: qty 2

## 2015-06-06 MED ORDER — VANCOMYCIN HCL IN DEXTROSE 1-5 GM/200ML-% IV SOLN
1000.0000 mg | Freq: Once | INTRAVENOUS | Status: AC
Start: 2015-06-06 — End: 2015-06-06
  Administered 2015-06-06: 1000 mg via INTRAVENOUS

## 2015-06-06 MED ORDER — LIDOCAINE HCL (CARDIAC) 20 MG/ML IV SOLN
INTRAVENOUS | Status: DC | PRN
  Administered 2015-06-06: 60 mg via INTRAVENOUS

## 2015-06-06 MED ORDER — ROCURONIUM BROMIDE 50 MG/5ML IV SOLN
INTRAVENOUS | Status: AC
  Filled 2015-06-06: qty 2

## 2015-06-06 MED ORDER — ONDANSETRON HCL 4 MG/2ML IJ SOLN
INTRAMUSCULAR | Status: DC | PRN
  Administered 2015-06-06: 4 mg via INTRAVENOUS

## 2015-06-06 MED ORDER — FENTANYL CITRATE (PF) 250 MCG/5ML IJ SOLN
INTRAMUSCULAR | Status: AC
  Filled 2015-06-06: qty 5

## 2015-06-06 MED ORDER — DEXTROSE 5 % IV SOLN
1.0000 g | Freq: Once | INTRAVENOUS | Status: AC
  Administered 2015-06-06: 1 g via INTRAVENOUS
  Filled 2015-06-06: qty 10

## 2015-06-06 MED ORDER — SODIUM CHLORIDE 0.9 % IJ SOLN
INTRAMUSCULAR | Status: AC
  Filled 2015-06-06: qty 10

## 2015-06-06 MED ORDER — SODIUM CHLORIDE 0.9 % IV SOLN: 250.0000 mL | INTRAVENOUS | Status: DC

## 2015-06-06 MED ORDER — EPHEDRINE SULFATE 50 MG/ML IJ SOLN
INTRAMUSCULAR | Status: AC
  Filled 2015-06-06: qty 1

## 2015-06-06 MED ORDER — ROCURONIUM BROMIDE 100 MG/10ML IV SOLN
INTRAVENOUS | Status: DC | PRN
  Administered 2015-06-06: 40 mg via INTRAVENOUS

## 2015-06-06 MED ORDER — MORPHINE SULFATE (PF) 2 MG/ML IV SOLN
1.0000 mg | INTRAVENOUS | Status: DC | PRN
  Administered 2015-06-06: 2 mg via INTRAVENOUS
  Filled 2015-06-06: qty 1

## 2015-06-06 MED ORDER — SODIUM CHLORIDE 0.9 % IJ SOLN: 3.0000 mL | Freq: Two times a day (BID) | INTRAMUSCULAR | Status: DC

## 2015-06-06 MED ORDER — HYDROCODONE-ACETAMINOPHEN 5-325 MG PO TABS
1.0000 | ORAL_TABLET | ORAL | Status: DC | PRN
  Administered 2015-06-07: 1 via ORAL
  Administered 2015-06-07: 2 via ORAL
  Filled 2015-06-06 (×2): qty 2

## 2015-06-06 MED ORDER — METHOCARBAMOL 1000 MG/10ML IJ SOLN: 500.0000 mg | Freq: Four times a day (QID) | INTRAVENOUS | Status: DC | PRN

## 2015-06-06 MED ORDER — CELECOXIB 200 MG PO CAPS
200.0000 mg | ORAL_CAPSULE | Freq: Two times a day (BID) | ORAL | Status: DC
  Administered 2015-06-06 – 2015-06-08 (×4): 200 mg via ORAL
  Filled 2015-06-06 (×4): qty 1

## 2015-06-06 MED ORDER — MUPIROCIN 2 % EX OINT
1.0000 "application " | TOPICAL_OINTMENT | Freq: Once | CUTANEOUS | Status: AC
  Administered 2015-06-06: 1 via TOPICAL
  Filled 2015-06-06: qty 22

## 2015-06-06 MED ORDER — POTASSIUM CHLORIDE IN NACL 20-0.9 MEQ/L-% IV SOLN
INTRAVENOUS | Status: DC
  Administered 2015-06-06 – 2015-06-07 (×2): via INTRAVENOUS
  Filled 2015-06-06 (×5): qty 1000

## 2015-06-06 MED ORDER — FENTANYL CITRATE (PF) 100 MCG/2ML IJ SOLN
INTRAMUSCULAR | Status: DC | PRN
  Administered 2015-06-06: 100 ug via INTRAVENOUS
  Administered 2015-06-06 (×3): 50 ug via INTRAVENOUS

## 2015-06-06 MED ORDER — VANCOMYCIN HCL IN DEXTROSE 750-5 MG/150ML-% IV SOLN
750.0000 mg | INTRAVENOUS | Status: AC
  Administered 2015-06-06: 750 mg via INTRAVENOUS
  Filled 2015-06-06: qty 150

## 2015-06-06 MED ORDER — VANCOMYCIN HCL IN DEXTROSE 1-5 GM/200ML-% IV SOLN
INTRAVENOUS | Status: AC
  Filled 2015-06-06: qty 200

## 2015-06-06 MED ORDER — PROPOFOL 10 MG/ML IV BOLUS
INTRAVENOUS | Status: AC
  Filled 2015-06-06: qty 20

## 2015-06-06 MED ORDER — LACTATED RINGERS IV SOLN
INTRAVENOUS | Status: DC
  Administered 2015-06-06: 14:00:00 via INTRAVENOUS

## 2015-06-06 MED ORDER — MIDAZOLAM HCL 5 MG/5ML IJ SOLN
INTRAMUSCULAR | Status: DC | PRN
  Administered 2015-06-06: 2 mg via INTRAVENOUS

## 2015-06-06 MED ORDER — MIDAZOLAM HCL 2 MG/2ML IJ SOLN
INTRAMUSCULAR | Status: AC
  Filled 2015-06-06: qty 2

## 2015-06-06 MED ORDER — PROPOFOL 10 MG/ML IV BOLUS
INTRAVENOUS | Status: DC | PRN
  Administered 2015-06-06: 100 mg via INTRAVENOUS

## 2015-06-06 MED ORDER — SUGAMMADEX SODIUM 200 MG/2ML IV SOLN
INTRAVENOUS | Status: DC | PRN
  Administered 2015-06-06: 200 mg via INTRAVENOUS

## 2015-06-06 MED ORDER — FENTANYL CITRATE (PF) 100 MCG/2ML IJ SOLN
INTRAMUSCULAR | Status: AC
  Filled 2015-06-06: qty 2

## 2015-06-06 MED ORDER — MENTHOL 3 MG MT LOZG: 1.0000 | LOZENGE | OROMUCOSAL | Status: DC | PRN

## 2015-06-06 MED ORDER — FENTANYL CITRATE (PF) 100 MCG/2ML IJ SOLN
25.0000 ug | INTRAMUSCULAR | Status: DC | PRN
  Administered 2015-06-06 (×4): 25 ug via INTRAVENOUS

## 2015-06-06 MED ORDER — ONDANSETRON HCL 4 MG/2ML IJ SOLN
INTRAMUSCULAR | Status: AC
  Filled 2015-06-06: qty 2

## 2015-06-06 MED ORDER — PHENOL 1.4 % MT LIQD: 1.0000 | OROMUCOSAL | Status: DC | PRN

## 2015-06-06 MED ORDER — SODIUM CHLORIDE 0.9 % IJ SOLN: 3.0000 mL | INTRAMUSCULAR | Status: DC | PRN

## 2015-06-06 SURGICAL SUPPLY — 44 items
BAG DECANTER FOR FLEXI CONT (MISCELLANEOUS) ×2 IMPLANT
BENZOIN TINCTURE PRP APPL 2/3 (GAUZE/BANDAGES/DRESSINGS) ×2 IMPLANT
CANISTER SUCT 3000ML PPV (MISCELLANEOUS) ×2 IMPLANT
CONT SPEC 4OZ CLIKSEAL STRL BL (MISCELLANEOUS) ×2 IMPLANT
DRAPE LAPAROTOMY 100X72 PEDS (DRAPES) IMPLANT
DRAPE LAPAROTOMY 100X72X124 (DRAPES) ×2 IMPLANT
DRAPE POUCH INSTRU U-SHP 10X18 (DRAPES) ×2 IMPLANT
DRSG OPSITE POSTOP 3X4 (GAUZE/BANDAGES/DRESSINGS) ×2 IMPLANT
DRSG TELFA 3X8 NADH (GAUZE/BANDAGES/DRESSINGS) ×2 IMPLANT
DURAPREP 26ML APPLICATOR (WOUND CARE) ×2 IMPLANT
DURAPREP 6ML APPLICATOR 50/CS (WOUND CARE) IMPLANT
ELECT REM PT RETURN 9FT ADLT (ELECTROSURGICAL) ×2
ELECTRODE REM PT RTRN 9FT ADLT (ELECTROSURGICAL) ×1 IMPLANT
GAUZE SPONGE 4X4 16PLY XRAY LF (GAUZE/BANDAGES/DRESSINGS) IMPLANT
GLOVE BIO SURGEON STRL SZ8 (GLOVE) ×2 IMPLANT
GLOVE BIOGEL PI IND STRL 7.0 (GLOVE) ×4 IMPLANT
GLOVE BIOGEL PI IND STRL 7.5 (GLOVE) ×1 IMPLANT
GLOVE BIOGEL PI INDICATOR 7.0 (GLOVE) ×4
GLOVE BIOGEL PI INDICATOR 7.5 (GLOVE) ×1
GOWN STRL REUS W/ TWL LRG LVL3 (GOWN DISPOSABLE) ×1 IMPLANT
GOWN STRL REUS W/ TWL XL LVL3 (GOWN DISPOSABLE) ×1 IMPLANT
GOWN STRL REUS W/TWL 2XL LVL3 (GOWN DISPOSABLE) IMPLANT
GOWN STRL REUS W/TWL LRG LVL3 (GOWN DISPOSABLE) ×1
GOWN STRL REUS W/TWL XL LVL3 (GOWN DISPOSABLE) ×1
HEMOSTAT POWDER KIT SURGIFOAM (HEMOSTASIS) ×2 IMPLANT
KIT BASIN OR (CUSTOM PROCEDURE TRAY) ×2 IMPLANT
KIT ROOM TURNOVER OR (KITS) ×2 IMPLANT
NEEDLE HYPO 18GX1.5 BLUNT FILL (NEEDLE) IMPLANT
NEEDLE HYPO 25X1 1.5 SAFETY (NEEDLE) ×2 IMPLANT
NEEDLE SPNL 20GX3.5 QUINCKE YW (NEEDLE) IMPLANT
NS IRRIG 1000ML POUR BTL (IV SOLUTION) ×2 IMPLANT
PACK LAMINECTOMY NEURO (CUSTOM PROCEDURE TRAY) ×2 IMPLANT
PAD ARMBOARD 7.5X6 YLW CONV (MISCELLANEOUS) ×6 IMPLANT
STRIP CLOSURE SKIN 1/2X4 (GAUZE/BANDAGES/DRESSINGS) ×2 IMPLANT
SUT VIC AB 0 CT1 18XCR BRD8 (SUTURE) ×1 IMPLANT
SUT VIC AB 0 CT1 8-18 (SUTURE) ×1
SUT VIC AB 2-0 CP2 18 (SUTURE) ×2 IMPLANT
SUT VIC AB 3-0 SH 8-18 (SUTURE) ×2 IMPLANT
SWAB COLLECTION DEVICE MRSA (MISCELLANEOUS) ×2 IMPLANT
SYR 3ML LL SCALE MARK (SYRINGE) IMPLANT
TOWEL OR 17X24 6PK STRL BLUE (TOWEL DISPOSABLE) ×2 IMPLANT
TOWEL OR 17X26 10 PK STRL BLUE (TOWEL DISPOSABLE) ×2 IMPLANT
TUBE ANAEROBIC SPECIMEN COL (MISCELLANEOUS) ×2 IMPLANT
WATER STERILE IRR 1000ML POUR (IV SOLUTION) ×2 IMPLANT

## 2015-06-06 NOTE — Anesthesia Preprocedure Evaluation (Addendum)
Anesthesia Evaluation  Patient identified by MRN, date of birth, ID band Patient awake    Reviewed: Allergy & Precautions, H&P , NPO status , Patient's Chart, lab work & pertinent test results  History of Anesthesia Complications Negative for: history of anesthetic complications  Airway Mallampati: III  TM Distance: >3 FB Neck ROM: full    Dental  (+) Missing   Pulmonary former smoker,    Pulmonary exam normal breath sounds clear to auscultation       Cardiovascular hypertension, Pt. on medications Normal cardiovascular exam Rhythm:regular Rate:Normal     Neuro/Psych negative neurological ROS     GI/Hepatic negative GI ROS, Neg liver ROS,   Endo/Other  negative endocrine ROS  Renal/GU negative Renal ROS     Musculoskeletal   Abdominal   Peds  Hematology negative hematology ROS (+)   Anesthesia Other Findings Erectile dysfunction  Reproductive/Obstetrics negative OB ROS                            Anesthesia Physical Anesthesia Plan  ASA: II  Anesthesia Plan: General   Post-op Pain Management:    Induction: Intravenous  Airway Management Planned: Oral ETT  Additional Equipment:   Intra-op Plan:   Post-operative Plan: Extubation in OR  Informed Consent: I have reviewed the patients History and Physical, chart, labs and discussed the procedure including the risks, benefits and alternatives for the proposed anesthesia with the patient or authorized representative who has indicated his/her understanding and acceptance.   Dental Advisory Given  Plan Discussed with: Anesthesiologist, CRNA and Surgeon  Anesthesia Plan Comments:         Anesthesia Quick Evaluation

## 2015-06-06 NOTE — Transfer of Care (Signed)
Immediate Anesthesia Transfer of Care Note  Patient: Timothy Duncan  Procedure(s) Performed: Procedure(s): Presumed discitis, Lumbar exploration,Repeat discectomy Lumbar four-five with deep tissue culture (N/A)  Patient Location: PACU  Anesthesia Type:General  Level of Consciousness: awake, alert , oriented and patient cooperative  Airway & Oxygen Therapy: Patient Spontanous Breathing and Patient connected to nasal cannula oxygen  Post-op Assessment: Report given to RN, Post -op Vital signs reviewed and stable, Patient moving all extremities, Patient moving all extremities X 4 and Patient able to stick tongue midline  Post vital signs: Reviewed and stable  Last Vitals:  Filed Vitals:   06/06/15 1324 06/06/15 1722  BP: 149/76   Pulse: 85   Temp: 36.5 C 36.5 C  Resp: 18     Complications: No apparent anesthesia complications

## 2015-06-06 NOTE — Op Note (Signed)
06/06/2015  5:38 PM  PATIENT:  Timothy Duncan  74 y.o. 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 He presented about 3 weeks after surgery with severe back pain but no leg pain. MRI showed some mild enhancement of the disc space and a fluid collection ventral to the dura just superior to the disc space with some mild surrounding enhancement. His sedimentation rate and CRP were high and I suspect he had discitis, therefore he is brought to the operating room today for exploration of the wound and biopsy and culture of the disc and deep tissues. Patient understood the risks, benefits, and alternatives and potential outcomes and wished to proceed.  Findings at operation: I found minimal postoperative seroma or liquid hematoma, I found no epidural exudate or phlegmon or significant hematoma, the disc space and the surrounding tissues were degenerated and soft. There is no obvious exudate.  PROCEDURE DETAILS: The patient was taken to the operating room and after induction of adequate generalized endotracheal anesthesia, the patient was rolled into the prone position on the Wilson frame and all pressure points were padded. The lumbar region was cleaned and then prepped with DuraPrep and draped in the usual sterile fashion. His old incision was opened and dissection was carried down to the fascia. The fascia was opened and the paraspinous  musculature was taken down in a subperiosteal fashion to expose L4-5 on the right. The tissues were soft and degenerated and suckable. I found no significant exudate. The bone did not appear to be soft like it had osteomyelitis. I dissected through this tissue to find the lateral dura and the annulotomy. I removed several pieces of remaining disc and sent this for culture and biopsy. I cultured the deep tissues. I used a nerve hook and a Chartered loss adjuster to dissect superior to the disc space into the axilla of the L4 nerve root and found no significant hematoma or abscess. I irrigated with copious amounts of bacitracin containing saline solution. I then palpated with a coronary dilator along the nerve root and into the foramen to assure adequate decompression. I felt no more compression of the nerve root. I irrigated with saline solution containing bacitracin. Achieved hemostasis with bipolar cautery, lined the dura and deep tissues with vancomycin powder, and then closed the fascia with 0 Vicryl. I closed the subcutaneous tissues with 2-0 Vicryl and the subcuticular tissues with 3-0 Vicryl. The skin was then closed with benzoin and Steri-Strips. The drapes were removed, a sterile dressing was applied. The patient was awakened from general anesthesia and transferred to the recovery room in stable condition. At the end of the procedure all sponge, needle and instrument counts were correct.   PLAN OF CARE: Admit to inpatient   PATIENT DISPOSITION:  PACU - hemodynamically stable.   Delay start of Pharmacological VTE agent (>24hrs) due to surgical blood loss or risk of bleeding:  yes

## 2015-06-06 NOTE — Progress Notes (Signed)
ANTIBIOTIC CONSULT NOTE - INITIAL  Pharmacy Consult for Vancomycin Indication: osteomyelitis  No Known Allergies  Patient Measurements: Height: 5' 10.5" (179.1 cm) Weight: 224 lb (101.606 kg) IBW/kg (Calculated) : 74.15 Adjusted Body Weight:   Vital Signs: Temp: 98.3 F (36.8 C) (12/07 1833) Temp Source: Oral (12/07 1833) BP: 135/71 mmHg (12/07 1833) Pulse Rate: 76 (12/07 1833) Intake/Output from previous day:   Intake/Output from this shift:    Labs:  Recent Labs  06/06/15 1332  WBC 11.6*  HGB 13.6  PLT 389  CREATININE 0.86   Estimated Creatinine Clearance: 90.8 mL/min (by C-G formula based on Cr of 0.86). No results for input(s): VANCOTROUGH, VANCOPEAK, VANCORANDOM, GENTTROUGH, GENTPEAK, GENTRANDOM, TOBRATROUGH, TOBRAPEAK, TOBRARND, AMIKACINPEAK, AMIKACINTROU, AMIKACIN in the last 72 hours.   Microbiology: Recent Results (from the past 720 hour(s))  Surgical pcr screen     Status: None   Collection Time: 06/06/15  1:32 PM  Result Value Ref Range Status   MRSA, PCR NEGATIVE NEGATIVE Final   Staphylococcus aureus NEGATIVE NEGATIVE Final    Comment:        The Xpert SA Assay (FDA approved for NASAL specimens in patients over 62 years of age), is one component of a comprehensive surveillance program.  Test performance has been validated by [facility] for patients greater than or equal to 49 year old. It is not intended to diagnose infection nor to guide or monitor treatment.   Gram stain     Status: None   Collection Time: 06/06/15  4:50 PM  Result Value Ref Range Status   Specimen Description TISSUE  Final   Special Requests SOFT TISSUE FROM LUMBAR DISC   Final   Gram Stain   Final    RARE WBC PRESENT,BOTH PMN AND MONONUCLEAR NO ORGANISMS SEEN Gram Stain Report Called to,Read Back By and Verified With: D Russella Dar RN [phone] 06/06/15 A BROWNING    Report Status 06/06/2015 FINAL  Final  Gram stain     Status: None   Collection Time: 06/06/15  4:51 PM   Result Value Ref Range Status   Specimen Description WOUND  Final   Special Requests LUMBAR  Final   Gram Stain   Final    WBC PRESENT,BOTH PMN AND MONONUCLEAR NO ORGANISMS SEEN    Report Status 06/06/2015 FINAL  Final    Medical History: Past Medical History  Diagnosis Date  . Hypertension   . ED (erectile dysfunction)     Medications:  Prescriptions prior to admission  Medication Sig Dispense Refill Last Dose  . HYDROcodone-acetaminophen (NORCO) 10-325 MG tablet Take 1 tablet by mouth every 8 (eight) hours as needed. (Patient taking differently: Take 1 tablet by mouth every 8 (eight) hours as needed for moderate pain. ) 30 tablet 0 06/06/2015 at Unknown time  . benzonatate (TESSALON) 200 MG capsule Take 1 capsule (200 mg total) by mouth 3 (three) times daily as needed for cough. (Patient not taking: Reported on 11/21/2014) 20 capsule 0 Completed Course at Unknown time  . cefUROXime (CEFTIN) 500 MG tablet Take 1 tablet (500 mg total) by mouth 2 (two) times daily with a meal. (Patient not taking: Reported on 04/24/2015) 14 tablet 0 Not Taking at Unknown time  . HYDROcodone-homatropine (HYCODAN) 5-1.5 MG/5ML syrup Take 5 mLs by mouth every 6 (six) hours as needed for cough. (Patient not taking: Reported on 04/24/2015) 120 mL 0 Completed Course at Unknown time  . losartan-hydrochlorothiazide (HYZAAR) 50-12.5 MG per tablet Take 1/2 tablet by mouth in the  morning (Patient not taking: Reported on 06/05/2015) 45 tablet 3 Past Month at Unknown time  . sildenafil (VIAGRA) 100 MG tablet Take 1 tablet (100 mg total) by mouth as needed for erectile dysfunction. (Patient not taking: Reported on 04/24/2015) 10 tablet 10 Not Taking at Unknown time  . tiZANidine (ZANAFLEX) 4 MG tablet Take 1 tablet (4 mg total) by mouth every 6 (six) hours as needed for muscle spasms. (Patient not taking: Reported on 04/24/2015) 30 tablet 0 Not Taking at Unknown time   Scheduled:  . cefTRIAXone (ROCEPHIN)  IV  1 g  Intravenous Q24H  . celecoxib  200 mg Oral BID  . fentaNYL      . senna  1 tablet Oral BID  . sodium chloride  3 mL Intravenous Q12H  . vancomycin      . vancomycin      . vancomycin  750 mg Intravenous STAT   Infusions:  . sodium chloride    . 0.9 % NaCl with KCl 20 mEq / L     Assessment: 74yo male with recent lumbar diskectomy presents with back pain. Pharmacy is consulted to dose vancomycin for osteomyelitis. Pt is afebrile, WBC 11.6, sCr 0.86.  Pt received vancomycin 1g IV once. Will add 750mg  to total of 1750mg  load.  Goal of Therapy:  Vancomycin trough level 15-20 mcg/ml  Plan:  Vancomycin 1750mg  IV load followed by 1g IV q12h Measure antibiotic drug levels at steady state Follow up culture results, renal function and clinical course  Andrey Cota. Diona Foley, PharmD, [place] Clinical Pharmacist Pager [phone] 06/06/2015,7:02 PM

## 2015-06-06 NOTE — H&P (Signed)
Subjective: Patient is a 74 y.o. male admitted for back pain. S/P lumbar diskectomy a few weeks ago. Onset of symptoms was 2 weeks ago, rapidly worsening since that time.  The pain is rated severe, and is located at the across the lower back without radiation to the legs. The pain is described as aching and occurs all day. The symptoms have been progressive. Symptoms are exacerbated by exercise. MRI or CT showed somewhat enhancing disc space and a right-sided ventral peripherally enhancing fluid collection. 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   Past Medical History  Diagnosis Date  . Hypertension   . ED (erectile dysfunction)     Past Surgical History  Procedure Laterality Date  . Cataract extraction    . Appendectomy    . Tonsillectomy    . Colonoscopy    . Lumbar laminectomy/decompression microdiscectomy Right 05/03/2015    Procedure: Microdiscectomy - Lumbar four-Lumbar five - right ;  Surgeon: Eustace Moore, MD;  Location: [place] [facility];  Service: Neurosurgery;  Laterality: Right;    Prior to Admission medications   Medication Sig Start Date End Date Taking? Authorizing Provider  HYDROcodone-acetaminophen (NORCO) 10-325 MG tablet Take 1 tablet by mouth every 8 (eight) hours as needed. Patient taking differently: Take 1 tablet by mouth every 8 (eight) hours as needed for moderate pain.  05/04/15  Yes Eustace Moore, MD  benzonatate (TESSALON) 200 MG capsule Take 1 capsule (200 mg total) by mouth 3 (three) times daily as needed for cough. Patient not taking: Reported on 11/21/2014 11/16/14   Dorothyann Peng, NP  cefUROXime (CEFTIN) 500 MG tablet Take 1 tablet (500 mg total) by mouth 2 (two) times daily with a meal. Patient not taking: Reported on 04/24/2015 11/21/14   Dorothyann Peng, NP  HYDROcodone-homatropine [facility]) 5-1.5 MG/5ML syrup Take 5 mLs by mouth every 6 (six) hours as needed for cough. Patient not taking:  Reported on 04/24/2015 11/21/14   Dorothyann Peng, NP  losartan-hydrochlorothiazide [facility]) 50-12.5 MG per tablet Take 1/2 tablet by mouth in the morning Patient not taking: Reported on 06/05/2015 08/01/14   Dorena Cookey, MD  sildenafil (VIAGRA) 100 MG tablet Take 1 tablet (100 mg total) by mouth as needed for erectile dysfunction. Patient not taking: Reported on 04/24/2015 08/01/14 08/01/15  Dorena Cookey, MD  tiZANidine (ZANAFLEX) 4 MG tablet Take 1 tablet (4 mg total) by mouth every 6 (six) hours as needed for muscle spasms. Patient not taking: Reported on 04/24/2015 03/26/15   Dorothyann Peng, NP   No Known Allergies  Social History  Substance Use Topics  . Smoking status: Former Research scientist (life sciences)  . Smokeless tobacco: Never Used  . Alcohol Use: No    History reviewed. No pertinent family history.   Review of Systems  Positive ROS: neg  All other systems have been reviewed and were otherwise negative with the exception of those mentioned in the HPI and as above.  Objective: Vital signs in last 24 hours: Temp:  [97.7 F (36.5 C)] 97.7 F (36.5 C) (12/07 1324) Pulse Rate:  [85] 85 (12/07 1324) Resp:  [18] 18 (12/07 1324) BP: (149)/(76) 149/76 mmHg (12/07 1324) SpO2:  [94 %] 94 % (12/07 1324) Weight:  [101.606 kg (224 lb)] 101.606 kg (224 lb) (12/07 1324)  General Appearance: Alert, cooperative, no distress, appears stated age Head: Normocephalic, without obvious abnormality, atraumatic Eyes: PERRL, conjunctiva/corneas clear, EOM's intact    Neck: Supple, symmetrical, trachea  midline Back: Symmetric, no curvature, ROM normal, no CVA tenderness Lungs:  respirations unlabored Heart: Regular rate and rhythm Abdomen: Soft, non-tender Extremities: Extremities normal, atraumatic, no cyanosis or edema Pulses: 2+ and symmetric all extremities Skin: Skin color, texture, turgor normal, no rashes or lesions  NEUROLOGIC:   Mental status: Alert and oriented x4,  no aphasia, good attention span, fund  of knowledge, and memory Motor Exam - grossly normal Sensory Exam - grossly normal Reflexes: 1+ Coordination - grossly normal Gait - grossly normal Balance - grossly normal Cranial Nerves: I: smell Not tested  II: visual acuity  OS: nl    OD: nl  II: visual fields Full to confrontation  II: pupils Equal, round, reactive to light  III,VII: ptosis None  III,IV,VI: extraocular muscles  Full ROM  V: mastication Normal  V: facial light touch sensation  Normal  V,VII: corneal reflex  Present  VII: facial muscle function - upper  Normal  VII: facial muscle function - lower Normal  VIII: hearing Not tested  IX: soft palate elevation  Normal  IX,X: gag reflex Present  XI: trapezius strength  5/5  XI: sternocleidomastoid strength 5/5  XI: neck flexion strength  5/5  XII: tongue strength  Normal    Data Review Lab Results  Component Value Date   WBC 11.6* 06/06/2015   HGB 13.6 06/06/2015   HCT 41.5 06/06/2015   MCV 92.0 06/06/2015   PLT 389 06/06/2015   Lab Results  Component Value Date   NA 141 06/06/2015   K 3.9 06/06/2015   CL 107 06/06/2015   CO2 23 06/06/2015   BUN 10 06/06/2015   CREATININE 0.86 06/06/2015   GLUCOSE 98 06/06/2015   Lab Results  Component Value Date   INR 1.04 04/25/2015    Assessment/Plan: Patient admitted for lumbar reexploration with removal of epidural fluid collection and culture. Patient has failed a reasonable attempt at conservative therapy.  I explained the condition and procedure to the patient and answered any questions.  Patient wishes to proceed with procedure as planned. Understands risks/ benefits and typical outcomes of procedure.   [name] S 06/06/2015 3:17 PM

## 2015-06-06 NOTE — Anesthesia Procedure Notes (Signed)
Procedure Name: Intubation Date/Time: 06/06/2015 4:14 PM Performed by: Shirlyn Goltz Pre-anesthesia Checklist: Patient identified, Emergency Drugs available, Suction available and Patient being monitored Patient Re-evaluated:Patient Re-evaluated prior to inductionOxygen Delivery Method: Circle system utilized Preoxygenation: Pre-oxygenation with 100% oxygen Intubation Type: IV induction Ventilation: Mask ventilation without difficulty and Oral airway inserted - appropriate to patient size Laryngoscope Size: Mac and 4 Grade View: Grade II Tube type: Oral Tube size: 7.5 mm Number of attempts: 1 Airway Equipment and Method: Stylet Placement Confirmation: ETT inserted through vocal cords under direct vision,  positive ETCO2 and breath sounds checked- equal and bilateral Secured at: 24 cm Tube secured with: Tape Dental Injury: Teeth and Oropharynx as per pre-operative assessment

## 2015-06-07 ENCOUNTER — Encounter (HOSPITAL_COMMUNITY): Payer: Self-pay | Admitting: Neurological Surgery

## 2015-06-07 DIAGNOSIS — M4646 Discitis, unspecified, lumbar region: Principal | ICD-10-CM

## 2015-06-07 DIAGNOSIS — Z9889 Other specified postprocedural states: Secondary | ICD-10-CM

## 2015-06-07 MED ORDER — POLYETHYLENE GLYCOL 3350 17 G PO PACK
17.0000 g | PACK | Freq: Every day | ORAL | Status: DC
  Administered 2015-06-07 – 2015-06-08 (×2): 17 g via ORAL
  Filled 2015-06-07 (×2): qty 1

## 2015-06-07 MED ORDER — PNEUMOCOCCAL VAC POLYVALENT 25 MCG/0.5ML IJ INJ
0.5000 mL | INJECTION | INTRAMUSCULAR | Status: AC
  Administered 2015-06-08: 0.5 mL via INTRAMUSCULAR
  Filled 2015-06-07: qty 0.5

## 2015-06-07 MED ORDER — VANCOMYCIN HCL IN DEXTROSE 750-5 MG/150ML-% IV SOLN
750.0000 mg | INTRAVENOUS | Status: AC
Start: 2015-06-07 — End: 2015-06-07
  Administered 2015-06-07: 750 mg via INTRAVENOUS
  Filled 2015-06-07: qty 150

## 2015-06-07 MED ORDER — INFLUENZA VAC SPLIT QUAD 0.5 ML IM SUSY
0.5000 mL | PREFILLED_SYRINGE | INTRAMUSCULAR | Status: AC
  Administered 2015-06-08: 0.5 mL via INTRAMUSCULAR
  Filled 2015-06-07: qty 0.5

## 2015-06-07 MED ORDER — VANCOMYCIN HCL IN DEXTROSE 1-5 GM/200ML-% IV SOLN
1000.0000 mg | Freq: Two times a day (BID) | INTRAVENOUS | Status: DC
  Administered 2015-06-08 (×2): 1000 mg via INTRAVENOUS
  Filled 2015-06-07 (×3): qty 200

## 2015-06-07 MED ORDER — DEXTROSE 5 % IV SOLN
2.0000 g | INTRAVENOUS | Status: DC
  Administered 2015-06-07 – 2015-06-08 (×2): 2 g via INTRAVENOUS
  Filled 2015-06-07 (×2): qty 2

## 2015-06-07 NOTE — Consult Note (Signed)
[facility] for Infectious Disease  Date of Admission:  06/06/2015  Date of Consult:  06/07/2015  Reason for Consult: Discitis Referring Physician: Ronnald Ramp  Impression/Recommendation Discitis Would continue vanco/ceftriaxone place PIC (explained to pt) Await Cx F/u ESR and CPR as outpt  F/u constipation due to pain meds  L4-5 Laminectomy 05-2015  Thank you so much for this interesting consult,   Bobby Rumpf (pager) [phone] [email or web address]  Timothy Duncan is an 74 y.o. male.  HPI: 74 yo M with hx of L4-5 laminectomy, microdiscectomy 05-03-15. He returns 12-7 with worsening back pain over the last 2 weeks. Re-adm on 12-7 and returned to OR on 12-7 after he was found to have MRI showing enhancing disc space and R ventral enhancing fluid collection.  His ESR was and CRP were elevated at his neurosurgeons office. Post-op he was started on vanco/ceftriaxone. His gram stains are (-).   Past Medical History  Diagnosis Date  . Hypertension   . ED (erectile dysfunction)     Past Surgical History  Procedure Laterality Date  . Cataract extraction    . Appendectomy    . Tonsillectomy    . Colonoscopy    . Lumbar laminectomy/decompression microdiscectomy Right 05/03/2015    Procedure: Microdiscectomy - Lumbar four-Lumbar five - right ;  Surgeon: Eustace Moore, MD;  Location: [place] [facility];  Service: Neurosurgery;  Laterality: Right;  . Wound exploration N/A 06/06/2015    Procedure: Presumed discitis, Lumbar exploration,Repeat discectomy Lumbar four-five with deep tissue culture;  Surgeon: Eustace Moore, MD;  Location: [place] [facility];  Service: Neurosurgery;  Laterality: N/A;     No Known Allergies  Medications:  Scheduled: . cefTRIAXone (ROCEPHIN)  IV  1 g Intravenous Q24H  . celecoxib  200 mg Oral BID  . [START ON 06/08/2015] Influenza vac split quadrivalent PF  0.5 mL Intramuscular Tomorrow-1000  . [START ON 06/08/2015] pneumococcal 23 valent vaccine  0.5 mL  Intramuscular Tomorrow-1000  . polyethylene glycol  17 g Oral Daily  . senna  1 tablet Oral BID  . sodium chloride  3 mL Intravenous Q12H  . [START ON 06/08/2015] vancomycin  1,000 mg Intravenous Q12H  . vancomycin  750 mg Intravenous STAT    Abtx:  Anti-infectives    Start     Dose/Rate Route Frequency Ordered Stop   06/08/15 0100  vancomycin (VANCOCIN) IVPB 1000 mg/200 mL premix     1,000 mg 200 mL/hr over 60 Minutes Intravenous Every 12 hours 06/07/15 1313     06/07/15 1600  cefTRIAXone (ROCEPHIN) 1 g in dextrose 5 % 50 mL IVPB     1 g 100 mL/hr over 30 Minutes Intravenous Every 24 hours 06/06/15 1901     06/07/15 1315  vancomycin (VANCOCIN) IVPB 750 mg/150 ml premix     750 mg 150 mL/hr over 60 Minutes Intravenous STAT 06/07/15 1310 06/08/15 1315   06/07/15 0700  vancomycin (VANCOCIN) IVPB 1000 mg/200 mL premix  Status:  Discontinued     1,000 mg 200 mL/hr over 60 Minutes Intravenous Every 12 hours 06/06/15 1909 06/07/15 1313   06/06/15 1915  vancomycin (VANCOCIN) IVPB 750 mg/150 ml premix     750 mg 150 mL/hr over 60 Minutes Intravenous STAT 06/06/15 1905 06/06/15 2131   06/06/15 1617  vancomycin (VANCOCIN) 1 GM/200ML IVPB    Comments:  Loreli Dollar   : cabinet override      06/06/15 1617 06/07/15 0429   06/06/15 1550  vancomycin (VANCOCIN) 1000 MG  powder    Comments:  Loreli Dollar   : cabinet override      06/06/15 1550 06/07/15 0359   06/06/15 1530  vancomycin (VANCOCIN) IVPB 1000 mg/200 mL premix     1,000 mg 200 mL/hr over 60 Minutes Intravenous  Once 06/06/15 1520 06/06/15 1753   06/06/15 1530  cefTRIAXone (ROCEPHIN) 1 g in dextrose 5 % 50 mL IVPB     1 g 100 mL/hr over 30 Minutes Intravenous  Once 06/06/15 1520 06/06/15 1708      Total days of antibiotics: 1 vanco/ceftriaxone          Social History:  reports that he has quit smoking. He has never used smokeless tobacco. He reports that he does not drink alcohol or use illicit drugs.  History reviewed. No  pertinent family history.  Parents deceased in 20s. No illnesses in siblings.   Ophthalmic ROS: no f/c, worsening back to point he was unable to ambulate with walker, no numbness, +constipation (due to pain meds), see HPI.   Blood pressure 120/46, pulse 69, temperature 97.5 F (36.4 C), temperature source Oral, resp. rate 18, height 5' 10.5" (1.791 m), weight 101.606 kg (224 lb), SpO2 96 %. General appearance: alert, cooperative and no distress Eyes: negative findings: conjunctivae and sclerae normal and pupils equal, round, reactive to light and accomodation Throat: normal findings: oropharynx pink & moist without lesions or evidence of thrush Neck: no adenopathy and supple, symmetrical, trachea midline Back: lumbar wound is clean, well healing,. no erythema.  Lungs: clear to auscultation bilaterally Heart: regular rate and rhythm Abdomen: normal findings: bowel sounds normal and soft, non-tender Extremities: edema none   Results for orders placed or performed during the hospital encounter of 06/06/15 (from the past 48 hour(s))  Surgical pcr screen     Status: None   Collection Time: 06/06/15  1:32 PM  Result Value Ref Range   MRSA, PCR NEGATIVE NEGATIVE   Staphylococcus aureus NEGATIVE NEGATIVE    Comment:        The Xpert SA Assay (FDA approved for NASAL specimens in patients over 3 years of age), is one component of a comprehensive surveillance program.  Test performance has been validated by [facility] for patients greater than or equal to 43 year old. It is not intended to diagnose infection nor to guide or monitor treatment.   CBC     Status: Abnormal   Collection Time: 06/06/15  1:32 PM  Result Value Ref Range   WBC 11.6 (H) 4.0 - 10.5 K/uL   RBC 4.51 4.22 - 5.81 MIL/uL   Hemoglobin 13.6 13.0 - 17.0 g/dL   HCT 41.5 39.0 - 52.0 %   MCV 92.0 78.0 - 100.0 fL   MCH 30.2 26.0 - 34.0 pg   MCHC 32.8 30.0 - 36.0 g/dL   RDW 13.6 11.5 - 15.5 %   Platelets 389 150 - 400  K/uL  Basic metabolic panel     Status: None   Collection Time: 06/06/15  1:32 PM  Result Value Ref Range   Sodium 141 135 - 145 mmol/L   Potassium 3.9 3.5 - 5.1 mmol/L   Chloride 107 101 - 111 mmol/L   CO2 23 22 - 32 mmol/L   Glucose, Bld 98 65 - 99 mg/dL   BUN 10 6 - 20 mg/dL   Creatinine, Ser 0.86 0.61 - 1.24 mg/dL   Calcium 9.6 8.9 - 10.3 mg/dL   GFR calc non Af Amer >60 >60 mL/min  GFR calc Af Amer >60 >60 mL/min    Comment: (NOTE) The eGFR has been calculated using the CKD EPI equation. This calculation has not been validated in all clinical situations. eGFR's persistently <60 mL/min signify possible Chronic Kidney Disease.    Anion gap 11 5 - 15  Anaerobic culture     Status: None (Preliminary result)   Collection Time: 06/06/15  4:50 PM  Result Value Ref Range   Specimen Description TISSUE    Special Requests SOFT TISSUE FROM LUMBAR DISC    Gram Stain      RARE WBC PRESENT,BOTH PMN AND MONONUCLEAR NO ORGANISMS SEEN Performed at [facility] Gram Stain Report Called to,Read Back By and Verified With: Gram Stain Report Called to,Read Back By and Verified With: Anastasio Champion RN [phone] 06/06/15 A BROWNING Performed at [facility]    Culture PENDING    Report Status PENDING   Gram stain     Status: None   Collection Time: 06/06/15  4:50 PM  Result Value Ref Range   Specimen Description TISSUE    Special Requests SOFT TISSUE FROM LUMBAR DISC     Gram Stain      RARE WBC PRESENT,BOTH PMN AND MONONUCLEAR NO ORGANISMS SEEN Gram Stain Report Called to,Read Back By and Verified With: D Russella Dar RN [phone] 06/06/15 A BROWNING    Report Status 06/06/2015 FINAL   Tissue culture     Status: None (Preliminary result)   Collection Time: 06/06/15  4:50 PM  Result Value Ref Range   Specimen Description TISSUE    Special Requests SOFT TISSUE FROM LUMBAR DISC    Gram Stain      RARE WBC PRESENT,BOTH PMN AND MONONUCLEAR NO ORGANISMS SEEN Performed at [facility] Gram  Stain Report Called to,Read Back By and Verified With: Gram Stain Report Called to,Read Back By and Verified WithAnastasio Champion RD [id number] 06/06/15 A BROWNING Performed at [facility]    Culture PENDING    Report Status PENDING   Anaerobic culture     Status: None (Preliminary result)   Collection Time: 06/06/15  4:51 PM  Result Value Ref Range   Specimen Description WOUND    Special Requests LUMBAR    Gram Stain      RARE WBC PRESENT,BOTH PMN AND MONONUCLEAR NO SQUAMOUS EPITHELIAL CELLS SEEN NO ORGANISMS SEEN Performed at [facility] Performed at [facility]    Culture PENDING    Report Status PENDING   Gram stain     Status: None   Collection Time: 06/06/15  4:51 PM  Result Value Ref Range   Specimen Description WOUND    Special Requests LUMBAR    Gram Stain      WBC PRESENT,BOTH PMN AND MONONUCLEAR NO ORGANISMS SEEN    Report Status 06/06/2015 FINAL   Wound culture     Status: None (Preliminary result)   Collection Time: 06/06/15  4:51 PM  Result Value Ref Range   Specimen Description WOUND    Special Requests LUMBAR    Gram Stain      RARE WBC PRESENT,BOTH PMN AND MONONUCLEAR NO SQUAMOUS EPITHELIAL CELLS SEEN NO ORGANISMS SEEN Performed at [facility] Performed at [facility]    Culture PENDING    Report Status PENDING       Component Value Date/Time   SDES WOUND 06/06/2015 1651   SDES WOUND 06/06/2015 1651   SDES WOUND 06/06/2015 1651   [place] 06/06/2015 1651  SPECREQUEST LUMBAR 06/06/2015 1651   SPECREQUEST LUMBAR 06/06/2015 1651   CULT PENDING 06/06/2015 1651   CULT PENDING 06/06/2015 1651   REPTSTATUS PENDING 06/06/2015 1651   REPTSTATUS 06/06/2015 FINAL 06/06/2015 1651   REPTSTATUS PENDING 06/06/2015 1651   No results found. Recent Results (from the past 240 hour(s))  Surgical pcr screen     Status: None   Collection Time: 06/06/15  1:32 PM  Result Value Ref Range Status   MRSA, PCR NEGATIVE  NEGATIVE Final   Staphylococcus aureus NEGATIVE NEGATIVE Final    Comment:        The Xpert SA Assay (FDA approved for NASAL specimens in patients over 29 years of age), is one component of a comprehensive surveillance program.  Test performance has been validated by [facility] for patients greater than or equal to 10 year old. It is not intended to diagnose infection nor to guide or monitor treatment.   Anaerobic culture     Status: None (Preliminary result)   Collection Time: 06/06/15  4:50 PM  Result Value Ref Range Status   Specimen Description TISSUE  Final   Special Requests SOFT TISSUE FROM LUMBAR DISC  Final   Gram Stain   Final    RARE WBC PRESENT,BOTH PMN AND MONONUCLEAR NO ORGANISMS SEEN Performed at [facility] Gram Stain Report Called to,Read Back By and Verified With: Gram Stain Report Called to,Read Back By and Verified With: Anastasio Champion RN [phone] 06/06/15 A BROWNING Performed at [facility]    Culture PENDING  Incomplete   Report Status PENDING  Incomplete  Gram stain     Status: None   Collection Time: 06/06/15  4:50 PM  Result Value Ref Range Status   Specimen Description TISSUE  Final   Special Requests SOFT TISSUE FROM LUMBAR DISC   Final   Gram Stain   Final    RARE WBC PRESENT,BOTH PMN AND MONONUCLEAR NO ORGANISMS SEEN Gram Stain Report Called to,Read Back By and Verified With: Anastasio Champion RN [phone] 06/06/15 A BROWNING    Report Status 06/06/2015 FINAL  Final  Tissue culture     Status: None (Preliminary result)   Collection Time: 06/06/15  4:50 PM  Result Value Ref Range Status   Specimen Description TISSUE  Final   Special Requests SOFT TISSUE FROM LUMBAR DISC  Final   Gram Stain   Final    RARE WBC PRESENT,BOTH PMN AND MONONUCLEAR NO ORGANISMS SEEN Performed at [facility] Gram Stain Report Called to,Read Back By and Verified With: Gram Stain Report Called to,Read Back By and Verified WithAnastasio Champion RD [id number] 06/06/15 A  BROWNING Performed at [place] PENDING  Incomplete   Report Status PENDING  Incomplete  Anaerobic culture     Status: None (Preliminary result)   Collection Time: 06/06/15  4:51 PM  Result Value Ref Range Status   Specimen Description WOUND  Final   Special Requests LUMBAR  Final   Gram Stain   Final    RARE WBC PRESENT,BOTH PMN AND MONONUCLEAR NO SQUAMOUS EPITHELIAL CELLS SEEN NO ORGANISMS SEEN Performed at [facility] Performed at [facility]    Culture PENDING  Incomplete   Report Status PENDING  Incomplete  Gram stain     Status: None   Collection Time: 06/06/15  4:51 PM  Result Value Ref Range Status   Specimen Description WOUND  Final   Special Requests LUMBAR  Final  Gram Stain   Final    WBC PRESENT,BOTH PMN AND MONONUCLEAR NO ORGANISMS SEEN    Report Status 06/06/2015 FINAL  Final  Wound culture     Status: None (Preliminary result)   Collection Time: 06/06/15  4:51 PM  Result Value Ref Range Status   Specimen Description WOUND  Final   Special Requests LUMBAR  Final   Gram Stain   Final    RARE WBC PRESENT,BOTH PMN AND MONONUCLEAR NO SQUAMOUS EPITHELIAL CELLS SEEN NO ORGANISMS SEEN Performed at [facility] Performed at [facility]    Culture PENDING  Incomplete   Report Status PENDING  Incomplete      06/07/2015, 2:03 PM     LOS: 1 day    Records and images were personally reviewed where available.

## 2015-06-07 NOTE — Care Management Note (Signed)
Case Management Note  Patient Details  Name: Timothy Duncan MRN: [id number] Date of Birth: 1941/04/24  Subjective/Objective:                    Action/Plan: Patient was admitted for Lumbar reexploration at L4-5 on the right with repeat discectomy not requiring laminectomy, biopsy/ culture of disc material, culture of deep wound space. Lives at home alone. Will follow for discharge needs pending patient progress and physician orders.  Expected Discharge Date:                  Expected Discharge Plan:  Home/Self Care  In-House Referral:     Discharge planning Services     Post Acute Care Choice:    Choice offered to:     DME Arranged:    DME Agency:     HH Arranged:    HH Agency:     Status of Service:  In process, will continue to follow  Medicare Important Message Given:    Date Medicare IM Given:    Medicare IM give by:    Date Additional Medicare IM Given:    Additional Medicare Important Message give by:     If discussed at [place] of Stay Meetings, dates discussed:    Additional Comments:  Rolm Baptise, RN 06/07/2015, 1:57 PM

## 2015-06-07 NOTE — Progress Notes (Signed)
Patient ID: EWIN ROYSTER, male   DOB: 1941-06-05, 74 y.o.   MRN: [id number] Subjective: Patient reports   Objective: Vital signs in last 24 hours: Temp:  [97.5 F (36.4 C)-98.3 F (36.8 C)] 97.5 F (36.4 C) (12/08 1026) Pulse Rate:  [69-87] 69 (12/08 1026) Resp:  [12-25] 18 (12/08 1026) BP: (118-151)/(46-87) 120/46 mmHg (12/08 1026) SpO2:  [94 %-97 %] 96 % (12/08 1026) Weight:  [101.606 kg (224 lb)] 101.606 kg (224 lb) (12/07 1324)  Intake/Output from previous day: 12/07 0701 - 12/08 0700 In: 600 [I.V.:600] Out: 100 [Blood:100] Intake/Output this shift:    Awake and alert moving all extremities  Lab Results: Lab Results  Component Value Date   WBC 11.6* 06/06/2015   HGB 13.6 06/06/2015   HCT 41.5 06/06/2015   MCV 92.0 06/06/2015   PLT 389 06/06/2015   Lab Results  Component Value Date   INR 1.04 04/25/2015   BMET Lab Results  Component Value Date   NA 141 06/06/2015   K 3.9 06/06/2015   CL 107 06/06/2015   CO2 23 06/06/2015   GLUCOSE 98 06/06/2015   BUN 10 06/06/2015   CREATININE 0.86 06/06/2015   CALCIUM 9.6 06/06/2015    Studies/Results: No results found.  Assessment/Plan:  Awaiting cxs, cont abx, ID consult, mobilize   LOS: 1 day    JONES,DAVID S 06/07/2015, 10:48 AM

## 2015-06-07 NOTE — Progress Notes (Signed)
ANTIBIOTIC CONSULT NOTE - FOLLOW UP  Pharmacy Consult for vancomycin Indication: osteomyelitis  No Known Allergies Patient Measurements: Height: 5' 10.5" (179.1 cm) Weight: 224 lb (101.606 kg) IBW/kg (Calculated) : 74.15  Labs:  Recent Labs  06/06/15 1332  WBC 11.6*  HGB 13.6  PLT 389  CREATININE 0.86    Assessment: RN informed me that 750 mg dose ordered last night for total loading dose of 1750 mg was found hanging on IV pole this AM but still clamped - none of the dose was given to the patient despite what is charted in EPIC. Patient did receive 1g yesterday and a 1g dose this AM.   Goal of Therapy:  Vancomycin trough level 15-20 mcg/ml  Plan:  Re-order 750mg  IV vancomycin to be given now for full load d/t difficult to treat source and post-op lumbar re-exploration.  Re-time vancomycin 1g IV q12 out from this dose.  Continue to monitor renal function, clinical status, and culture results.   Sloan Leiter, PharmD, BCPS Clinical Pharmacist [phone] 06/07/2015,1:16 PM

## 2015-06-07 NOTE — Progress Notes (Signed)
Occupational Therapy Evaluation Patient Details Name: Timothy Duncan MRN: [id number] DOB: 1941/02/11 Today's Date: 06/07/2015    History of Present Illness 74 y.o. male admitted for back pain s/p lumbar diskectomy a few weeks ago.    Clinical Impression   Evaluation limited by pt declining to participate further with therapy today because he had just gotten back into bed prior to OT arrival. Pt able to recall 2/3 back precautions. Pt will benefit from acute skilled OT to increase independence and safety with ADLs and functional mobility to allow for safe discharge home. Will provide d/c recommendations when pt willing to participate with OT. Will continue to follow acutely.    Follow Up Recommendations  Other (comment) (TBD when pt willing to participate with OT)    Equipment Recommendations  Other (comment) (TBD when pt willing to participate with OT)    Recommendations for Other Services       Precautions / Restrictions Precautions Precautions: Back Precaution Booklet Issued: Yes (comment) Precaution Comments: Pt able to recall 2/3 back precautions. Reviewed handout Restrictions Weight Bearing Restrictions: No      Mobility Bed Mobility Overal bed mobility: Needs Assistance Bed Mobility: Rolling;Sidelying to Sit Rolling: Min assist Sidelying to sit: Mod assist       General bed mobility comments: Pt declined to participate further with therapy today.  Transfers Overall transfer level: Needs assistance Equipment used: Rolling walker (2 wheeled) Transfers: Sit to/from Stand Sit to Stand: Min guard         General transfer comment: Pt declined to participate further with therapy today.    Balance Overall balance assessment: Needs assistance         Standing balance support: During functional activity;No upper extremity supported Standing balance-Leahy Scale: Fair                              ADL                                          General ADL Comments: Pt declined to participate further with therapy today.     Vision     Perception     Praxis      Pertinent Vitals/Pain Pain Assessment: 0-10 Pain Score: 3  Pain Location: low back Pain Descriptors / Indicators: Aching;Sore Pain Intervention(s): Limited activity within patient's tolerance;Monitored during session     Hand Dominance Right   Extremity/Trunk Assessment     Lower Extremity Assessment Lower Extremity Assessment: Generalized weakness       Communication Communication Communication: No difficulties   Cognition Arousal/Alertness: Awake/alert Behavior During Therapy: WFL for tasks assessed/performed Overall Cognitive Status: Within Functional Limits for tasks assessed                     General Comments       Exercises       Shoulder Instructions      Home Living Family/patient expects to be discharged to:: Private residence Living Arrangements: Alone Available Help at Discharge: Family;Available PRN/intermittently Type of Home: House Home Access: Stairs to enter [employer] of Steps: 2 Entrance Stairs-Rails: None Home Layout: Two level;Able to live on main level with bedroom/bathroom     Bathroom Shower/Tub: Walk-in shower;Door   [employer] Toilet: Standard     Home Equipment: Environmental consultant - 2 wheels   Additional  Comments: Son and daughter-in-law live in [place] - available as needed      Prior Functioning/Environment Level of Independence: Independent with assistive device(s)        Comments: used RW in community and office; works full time as Engineer, maintenance (IT)    OT Diagnosis: Acute pain   OT Problem List: Decreased strength;Decreased activity tolerance;Decreased range of motion;Impaired balance (sitting and/or standing);Decreased coordination;Decreased knowledge of use of DME or AE;Decreased safety awareness;Decreased knowledge of precautions;Pain   OT Treatment/Interventions: Self-care/ADL  training;Therapeutic exercise;DME and/or AE instruction;Therapeutic activities;Patient/family education;Balance training    OT Goals(Current goals can be found in the care plan section) Acute Rehab OT Goals Patient Stated Goal: to go home ASAP OT Goal Formulation: With patient Time For Goal Achievement: 06/21/15 Potential to Achieve Goals: Good ADL Goals Pt Will Perform Lower Body Bathing: with modified independence;with adaptive equipment;sitting/lateral leans;sit to/from stand Pt Will Perform Lower Body Dressing: with modified independence;with adaptive equipment;sitting/lateral leans;sit to/from stand Pt Will Transfer to Toilet: with modified independence;ambulating;regular height toilet Pt Will Perform Toileting - Clothing Manipulation and hygiene: with modified independence;sitting/lateral leans;sit to/from stand Pt Will Perform Tub/Shower Transfer: Shower transfer;with modified independence;ambulating;grab bars;rolling walker Additional ADL Goal #1: Pt will demonstrate understanding of 3/3 back precautions to increase safety with ADLs.  OT Frequency: Min 2X/week   Barriers to D/C:            Co-evaluation              End of Session Nurse Communication: Mobility status;Precautions  Activity Tolerance: Patient limited by fatigue Patient left: in bed;with call bell/phone within reach;with bed alarm set   Time: [id number] OT Time Calculation (min): 12 min Charges:  OT General Charges $OT Visit: 1 Procedure OT Evaluation $Initial OT Evaluation Tier I: 1 Procedure OT Treatments $Self Care/Home Management : 8-22 mins G-Codes:    Redmond Baseman, OTR/L 2015-06-09, 2:00 PM

## 2015-06-07 NOTE — Evaluation (Signed)
Physical Therapy Evaluation Patient Details Name: Timothy Duncan MRN: [id number] DOB: 1941/06/23 Today's Date: 06/07/2015   History of Present Illness  Patient is a 74 y.o. male admitted for back pain. S/P lumbar diskectomy a few weeks ago. Onset of symptoms was 2 weeks ago, rapidly worsening since that time. The pain is rated severe, and is located at the across the lower back without radiation to the legs. The pain is described as aching and occurs all day. The symptoms have been progressive. Symptoms are exacerbated by exercise. MRI or CT showed somewhat enhancing disc space and a right-sided ventral peripherally enhancing fluid collection. 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   Clinical Impression  Patient is s/p above surgery resulting in the deficits listed below (see PT Problem List). Anticipate pt can d/c safely to home without follow up services, however may need HHPT depending on progress.  Patient will benefit from skilled PT to increase their independence and safety with mobility (while adhering to their precautions) to allow discharge to the venue listed below.    Follow Up Recommendations No PT follow up    Equipment Recommendations  None recommended by PT    Recommendations for Other Services       Precautions / Restrictions Precautions Precautions: Back Restrictions Weight Bearing Restrictions: No      Mobility  Bed Mobility Overal bed mobility: Needs Assistance Bed Mobility: Rolling;Sidelying to Sit Rolling: Min assist Sidelying to sit: Mod assist       General bed mobility comments: pt with reliance on bed rail for bed mobility  Transfers Overall transfer level: Needs assistance Equipment used: Rolling walker (2 wheeled) Transfers: Sit to/from Stand Sit to Stand: Min guard         General transfer comment: cues for correct hand placement however pt declined standing with bil  UE support on RW.  Pt stating "this is how I do it" and unwilling to follow PT recs  Ambulation/Gait Ambulation/Gait assistance: Supervision Ambulation Distance (Feet): 150 Feet Assistive device: Rolling walker (2 wheeled) Gait Pattern/deviations: WFL(Within Functional Limits)   Gait velocity interpretation: Below normal speed for age/gender    Stairs            Wheelchair Mobility    Modified Rankin (Stroke Patients Only)       Balance Overall balance assessment: Needs assistance         Standing balance support: During functional activity;No upper extremity supported Standing balance-Leahy Scale: Fair                               Pertinent Vitals/Pain Pain Assessment: 0-10 Pain Score: 5  Pain Location: back Pain Intervention(s): Monitored during session;Repositioned;Premedicated before session    [place] expects to be discharged to:: Private residence Living Arrangements: Alone Available Help at Discharge: Family;Available PRN/intermittently Type of Home: House Home Access: Stairs to enter Entrance Stairs-Rails: None Entrance Stairs-Number of Steps: 2 Home Layout: Two level;Able to live on main level with bedroom/bathroom Home Equipment: Gilford Rile - 2 wheels      Prior Function Level of Independence: Independent with assistive device(s)         Comments: used RW in community and office; works full time as English as a second language teacher        Extremity/Trunk Assessment  Lower Extremity Assessment: Generalized weakness         Communication   Communication: No difficulties  Cognition Arousal/Alertness: Awake/alert Behavior During Therapy: WFL for tasks assessed/performed                        General Comments      Exercises        Assessment/Plan    PT Assessment Patient needs continued PT services  PT Diagnosis Difficulty walking;Generalized weakness;Acute pain   PT  Problem List Decreased strength;Decreased activity tolerance;Decreased balance;Decreased mobility;Decreased knowledge of use of DME;Pain;Decreased safety awareness;Decreased knowledge of precautions  PT Treatment Interventions DME instruction;Gait training;Stair training;Functional mobility training;Therapeutic activities;Therapeutic exercise;Balance training;Patient/family education   PT Goals (Current goals can be found in the Care Plan section) Acute Rehab PT Goals Patient Stated Goal: to go home ASAP PT Goal Formulation: With patient Time For Goal Achievement: 06/14/15 Potential to Achieve Goals: Good    Frequency Min 5X/week   Barriers to discharge Decreased caregiver support daughter in law reports pt will have supervision and assistance at d/c if needed; otherwise pt plans to d/c home alone    Co-evaluation               End of Session Equipment Utilized During Treatment: Gait belt Activity Tolerance: Patient tolerated treatment well;No increased pain Patient left: in chair;with call bell/phone within reach;with chair alarm set;with family/visitor present Nurse Communication: Mobility status         Time: 1032-1120 PT Time Calculation (min) (ACUTE ONLY): 48 min   Charges:   PT Evaluation $Initial PT Evaluation Tier I: 1 Procedure PT Treatments $Gait Training: 23-37 mins $Therapeutic Activity: 8-22 mins   PT G Codes:       Laureen Abrahams, PT, DPT 06/07/2015 11:34 AM [id number]

## 2015-06-08 DIAGNOSIS — I10 Essential (primary) hypertension: Secondary | ICD-10-CM | POA: Diagnosis not present

## 2015-06-08 DIAGNOSIS — M4646 Discitis, unspecified, lumbar region: Secondary | ICD-10-CM | POA: Diagnosis not present

## 2015-06-08 DIAGNOSIS — K5900 Constipation, unspecified: Secondary | ICD-10-CM

## 2015-06-08 DIAGNOSIS — Z23 Encounter for immunization: Secondary | ICD-10-CM | POA: Diagnosis not present

## 2015-06-08 DIAGNOSIS — K5903 Drug induced constipation: Secondary | ICD-10-CM | POA: Diagnosis not present

## 2015-06-08 DIAGNOSIS — Z87891 Personal history of nicotine dependence: Secondary | ICD-10-CM | POA: Diagnosis not present

## 2015-06-08 MED ORDER — SODIUM CHLORIDE 0.9 % IJ SOLN
10.0000 mL | INTRAMUSCULAR | Status: DC | PRN
  Administered 2015-06-08: 10 mL
  Filled 2015-06-08: qty 40

## 2015-06-08 MED ORDER — VANCOMYCIN HCL IN DEXTROSE 1-5 GM/200ML-% IV SOLN: 1000.0000 mg | Freq: Two times a day (BID) | INTRAVENOUS | Status: DC

## 2015-06-08 MED ORDER — ADULT MULTIVITAMIN W/MINERALS CH: 1.0000 | ORAL_TABLET | Freq: Every day | ORAL | Status: DC

## 2015-06-08 MED ORDER — HEPARIN SOD (PORK) LOCK FLUSH 100 UNIT/ML IV SOLN: 250.0000 [IU] | INTRAVENOUS | Status: DC | PRN

## 2015-06-08 MED ORDER — HEPARIN SOD (PORK) LOCK FLUSH 100 UNIT/ML IV SOLN
250.0000 [IU] | INTRAVENOUS | Status: AC | PRN
  Administered 2015-06-08: 250 [IU]

## 2015-06-08 MED ORDER — DEXTROSE 5 % IV SOLN: 2.0000 g | INTRAVENOUS | Status: DC

## 2015-06-08 MED ORDER — HYDROCODONE-ACETAMINOPHEN 10-325 MG PO TABS: 1.0000 | ORAL_TABLET | Freq: Four times a day (QID) | ORAL | Status: DC | PRN

## 2015-06-08 NOTE — Progress Notes (Signed)
Physical Therapy Treatment Patient Details Name: Timothy Duncan MRN: [id number] DOB: [birth year] Today's Date: 06/08/2015    History of Present Illness 74 y.o. male admitted for back pain s/p lumbar diskectomy a few weeks ago.     PT Comments    Pt with improved mobility today only needing supervision for bed mobility.  Distance improved today.  Follow Up Recommendations  No PT follow up     Equipment Recommendations  None recommended by PT    Recommendations for Other Services       Precautions / Restrictions Precautions Precautions: Back Precaution Comments: Able to recall 2/3 back precautions Restrictions Weight Bearing Restrictions: No    Mobility  Bed Mobility Overal bed mobility: Needs Assistance Bed Mobility: Rolling;Sidelying to Sit Rolling: Supervision Sidelying to sit: Supervision       General bed mobility comments: bed flat without rails  Transfers Overall transfer level: Needs assistance Equipment used: Rolling walker (2 wheeled) Transfers: Sit to/from Stand Sit to Stand: Supervision         General transfer comment: min cues for safety however pt did not lock rollator despite cues  Ambulation/Gait Ambulation/Gait assistance: Supervision Ambulation Distance (Feet): 200 Feet Assistive device: 4-wheeled walker Gait Pattern/deviations: WFL(Within Functional Limits)     General Gait Details: improved distance and speed   Stairs            Wheelchair Mobility    Modified Rankin (Stroke Patients Only)       Balance Overall balance assessment: Needs assistance Sitting-balance support: No upper extremity supported;Feet supported Sitting balance-Leahy Scale: Fair     Standing balance support: Bilateral upper extremity supported;During functional activity Standing balance-Leahy Scale: Fair                      Cognition Arousal/Alertness: Awake/alert Behavior During Therapy: WFL for tasks assessed/performed Overall  Cognitive Status: Within Functional Limits for tasks assessed       Memory: Decreased recall of precautions              Exercises      General Comments        Pertinent Vitals/Pain Pain Assessment: 0-10 Pain Score: 3  Pain Location: back Pain Descriptors / Indicators: Aching Pain Intervention(s): Monitored during session;Limited activity within patient's tolerance;Repositioned    Home Living                      Prior Function            PT Goals (current goals can now be found in the care plan section) Acute Rehab PT Goals Patient Stated Goal: to go home ASAP PT Goal Formulation: With patient Time For Goal Achievement: 06/14/15 Potential to Achieve Goals: Good Progress towards PT goals: Progressing toward goals    Frequency  Min 5X/week    PT Plan Current plan remains appropriate    Co-evaluation             End of Session Equipment Utilized During Treatment: Gait belt Activity Tolerance: Patient tolerated treatment well;No increased pain Patient left: in chair;with call bell/phone within reach     Time: 1210-1232 PT Time Calculation (min) (ACUTE ONLY): 22 min  Charges:  $Gait Training: 8-22 mins                    G Codes:        Laureen Abrahams, PT, DPT 06/08/2015 3:34 PM [phone]

## 2015-06-08 NOTE — Progress Notes (Signed)
Occupational Therapy Treatment Patient Details Name: Timothy Duncan MRN: [id number] DOB: 26-May-1941 Today's Date: 06/08/2015    History of present illness 74 y.o. male admitted for back pain s/p lumbar diskectomy a few weeks ago.    OT comments  Pt agreeable to participate in therapy session today although pt anxious to leave hospital. Pt completed self-care and functional transfers with supervision-min guard assist and min verbal cues to adhere to back precautions. Attempted to educate pt on AE, compensatory and fall prevention strategies, but pt claims he already knows these. No OT follow up recommended and pt has already obtained rollator and AE, so no DME recommended. Will continue to follow acutely.    Follow Up Recommendations  No OT follow up;Supervision - Intermittent    Equipment Recommendations  None recommended by OT    Recommendations for Other Services      Precautions / Restrictions Precautions Precautions: Back Precaution Comments: Able to recall 2/3 back precautions Restrictions Weight Bearing Restrictions: No       Mobility Bed Mobility               General bed mobility comments: Pt up in chair on arrival  Transfers Overall transfer level: Needs assistance Equipment used: Rolling walker (2 wheeled) Transfers: Sit to/from Stand Sit to Stand: Supervision         General transfer comment: Reminded pt to lock brakes on rollator, pt stated "I know how to use this thing" without lock brakes - OT provided physical assist to block wheels. Pt bending significantly to stand, but refused to attempt different method to adhere to back precautions    Balance Overall balance assessment: Needs assistance Sitting-balance support: No upper extremity supported;Feet supported Sitting balance-Leahy Scale: Fair     Standing balance support: Bilateral upper extremity supported;During functional activity Standing balance-Leahy Scale: Fair                      ADL Overall ADL's : Needs assistance/impaired     Grooming: Wash/dry hands;Supervision/safety;Standing               Lower Body Dressing: Supervision/safety;Set up;With adaptive equipment;Adhering to back precautions;Sitting/lateral leans   Toilet Transfer: Supervision/safety;Ambulation;Regular Toilet;RW   Toileting- Clothing Manipulation and Hygiene: Supervision/safety;Adhering to back precautions   Tub/ Shower Transfer: Walk-in shower;Min guard;Adhering to back precautions;Ambulation;Grab bars;Rolling walker   Functional mobility during ADLs: Supervision/safety;Rolling walker General ADL Comments: Pt completed all functional transfers with supervision-min guard assist. Pt used grab bars for shower transfer and does not have them at home, but says her has "other bars" that he uses and that getting in and out of the shower will not be a problem. Educated pt not to grab onto towel racks and other bars not meant to hold significant weight. Pt's daughter-in-law purchased AE kit this AM and pt has no questions about how to use these items.       Vision                     Perception     Praxis      Cognition   Behavior During Therapy: Flat affect Overall Cognitive Status: Within Functional Limits for tasks assessed       Memory: Decreased recall of precautions               Extremity/Trunk Assessment               Exercises     Shoulder Instructions  General Comments      Pertinent Vitals/ Pain       Pain Assessment: 0-10 Pain Score: 3  Pain Location: back Pain Descriptors / Indicators: Aching Pain Intervention(s): Limited activity within patient's tolerance;Monitored during session;Repositioned  Home Living                                          Prior Functioning/Environment              Frequency Min 2X/week     Progress Toward Goals  OT Goals(current goals can now be found in the care plan  section)  Progress towards OT goals: Progressing toward goals  Acute Rehab OT Goals Patient Stated Goal: to go home ASAP OT Goal Formulation: With patient Time For Goal Achievement: 06/21/15 Potential to Achieve Goals: Good ADL Goals Pt Will Perform Lower Body Bathing: with modified independence;with adaptive equipment;sitting/lateral leans;sit to/from stand Pt Will Perform Lower Body Dressing: with modified independence;with adaptive equipment;sitting/lateral leans;sit to/from stand Pt Will Transfer to Toilet: with modified independence;ambulating;regular height toilet Pt Will Perform Toileting - Clothing Manipulation and hygiene: with modified independence;sitting/lateral leans;sit to/from stand Pt Will Perform Tub/Shower Transfer: Shower transfer;with modified independence;ambulating;grab bars;rolling walker Additional ADL Goal #1: Pt will demonstrate understanding of 3/3 back precautions to increase safety with ADLs.  Plan Discharge plan remains appropriate    Co-evaluation                 End of Session Equipment Utilized During Treatment: Gait belt;Rolling walker   Activity Tolerance Patient tolerated treatment well   Patient Left in chair;with call bell/phone within reach   Nurse Communication Mobility status;Precautions        Time: 2979-8921 OT Time Calculation (min): 20 min  Charges: OT General Charges $OT Visit: 1 Procedure OT Treatments $Self Care/Home Management : 8-22 mins  Redmond Baseman, OTR/L 06/08/2015, 2:39 PM

## 2015-06-08 NOTE — Progress Notes (Signed)
0956Peripherally Inserted Central Catheter/Midline Placement  The IV Nurse has discussed with the patient and/or persons authorized to consent for the patient, the purpose of this procedure and the potential benefits and risks involved with this procedure.  The benefits include less needle sticks, lab draws from the catheter and patient may be discharged home with the catheter.  Risks include, but not limited to, infection, bleeding, blood clot (thrombus formation), and puncture of an artery; nerve damage and irregular heat beat.  Alternatives to this procedure were also discussed.  PICC/Midline Placement Documentation  PICC Single Lumen 06/08/15 PICC Right Brachial 45 cm 0 cm (Active)  Indication for Insertion or Continuance of Line Home intravenous therapies (PICC only) 06/08/2015 10:00 AM  Exposed Catheter (cm) 0 cm 06/08/2015 10:00 AM  Dressing Change Due 06/15/15 06/08/2015 10:00 AM       Jule Economy Horton 06/08/2015, 10:50 AM

## 2015-06-08 NOTE — Progress Notes (Signed)
INFECTIOUS DISEASE PROGRESS NOTE  ID: Timothy Duncan is a 74 y.o. male with  Active Problems:   S/P lumbar laminectomy  Subjective: Without complaints  Abtx:  Anti-infectives    Start     Dose/Rate Route Frequency Ordered Stop   06/08/15 0100  vancomycin (VANCOCIN) IVPB 1000 mg/200 mL premix     1,000 mg 200 mL/hr over 60 Minutes Intravenous Every 12 hours 06/07/15 1313     06/07/15 1630  cefTRIAXone (ROCEPHIN) 2 g in dextrose 5 % 50 mL IVPB     2 g 100 mL/hr over 30 Minutes Intravenous Every 24 hours 06/07/15 1433     06/07/15 1600  cefTRIAXone (ROCEPHIN) 1 g in dextrose 5 % 50 mL IVPB  Status:  Discontinued     1 g 100 mL/hr over 30 Minutes Intravenous Every 24 hours 06/06/15 1901 06/07/15 1433   06/07/15 1315  vancomycin (VANCOCIN) IVPB 750 mg/150 ml premix     750 mg 150 mL/hr over 60 Minutes Intravenous STAT 06/07/15 1310 06/07/15 1555   06/07/15 0700  vancomycin (VANCOCIN) IVPB 1000 mg/200 mL premix  Status:  Discontinued     1,000 mg 200 mL/hr over 60 Minutes Intravenous Every 12 hours 06/06/15 1909 06/07/15 1313   06/06/15 1915  vancomycin (VANCOCIN) IVPB 750 mg/150 ml premix     750 mg 150 mL/hr over 60 Minutes Intravenous STAT 06/06/15 1905 06/06/15 2131   06/06/15 1617  vancomycin (VANCOCIN) 1 GM/200ML IVPB    Comments:  Key, Jennifer   : cabinet override      06/06/15 1617 06/07/15 0429   06/06/15 1550  vancomycin (VANCOCIN) 1000 MG powder    Comments:  Loreli Dollar   : cabinet override      06/06/15 1550 06/07/15 0359   06/06/15 1530  vancomycin (VANCOCIN) IVPB 1000 mg/200 mL premix     1,000 mg 200 mL/hr over 60 Minutes Intravenous  Once 06/06/15 1520 06/06/15 1753   06/06/15 1530  cefTRIAXone (ROCEPHIN) 1 g in dextrose 5 % 50 mL IVPB     1 g 100 mL/hr over 30 Minutes Intravenous  Once 06/06/15 1520 06/06/15 1708      Medications:  Scheduled: . cefTRIAXone (ROCEPHIN)  IV  2 g Intravenous Q24H  . celecoxib  200 mg Oral BID  . polyethylene glycol  17 g  Oral Daily  . senna  1 tablet Oral BID  . sodium chloride  3 mL Intravenous Q12H  . vancomycin  1,000 mg Intravenous Q12H    Objective: Vital signs in last 24 hours: Temp:  [97.9 F (36.6 C)-98.7 F (37.1 C)] 98.5 F (36.9 C) (12/09 0932) Pulse Rate:  [67-87] 86 (12/09 0932) Resp:  [18-20] 20 (12/09 0932) BP: (116-152)/(48-71) 147/71 mmHg (12/09 0932) SpO2:  [94 %-98 %] 98 % (12/09 0932)   General appearance: alert, cooperative and no distress Resp: clear to auscultation bilaterally Cardio: regular rate and rhythm GI: normal findings: bowel sounds normal and soft, non-tender  Lab Results  Recent Labs  06/06/15 1332  WBC 11.6*  HGB 13.6  HCT 41.5  NA 141  K 3.9  CL 107  CO2 23  BUN 10  CREATININE 0.86   Liver Panel No results for input(s): PROT, ALBUMIN, AST, ALT, ALKPHOS, BILITOT, BILIDIR, IBILI in the last 72 hours. Sedimentation Rate No results for input(s): ESRSEDRATE in the last 72 hours. C-Reactive Protein No results for input(s): CRP in the last 72 hours.  Microbiology: Recent Results (from the past 240  hour(s))  Surgical pcr screen     Status: None   Collection Time: 06/06/15  1:32 PM  Result Value Ref Range Status   MRSA, PCR NEGATIVE NEGATIVE Final   Staphylococcus aureus NEGATIVE NEGATIVE Final    Comment:        The Xpert SA Assay (FDA approved for NASAL specimens in patients over 21 years of age), is one component of a comprehensive surveillance program.  Test performance has been validated by [facility] for patients greater than or equal to 1 year old. It is not intended to diagnose infection nor to guide or monitor treatment.   Anaerobic culture     Status: None (Preliminary result)   Collection Time: 06/06/15  4:50 PM  Result Value Ref Range Status   Specimen Description TISSUE  Final   Special Requests SOFT TISSUE FROM LUMBAR DISC  Final   Gram Stain   Final    RARE WBC PRESENT,BOTH PMN AND MONONUCLEAR NO ORGANISMS  SEEN Performed at [facility] Gram Stain Report Called to,Read Back By and Verified With: Gram Stain Report Called to,Read Back By and Verified With: D GLEBER RN [id number] 06/06/15 A BROWNING Performed at [facility]    Culture   Final    NO ANAEROBES ISOLATED; CULTURE IN PROGRESS FOR 5 DAYS Performed at [facility]    Report Status PENDING  Incomplete  Gram stain     Status: None   Collection Time: 06/06/15  4:50 PM  Result Value Ref Range Status   Specimen Description TISSUE  Final   Special Requests SOFT TISSUE FROM LUMBAR DISC   Final   Gram Stain   Final    RARE WBC PRESENT,BOTH PMN AND MONONUCLEAR NO ORGANISMS SEEN Gram Stain Report Called to,Read Back By and Verified With: D GLEBER RN [id number] 06/06/15 A BROWNING    Report Status 06/06/2015 FINAL  Final  Tissue culture     Status: None (Preliminary result)   Collection Time: 06/06/15  4:50 PM  Result Value Ref Range Status   Specimen Description TISSUE  Final   Special Requests SOFT TISSUE FROM LUMBAR DISC  Final   Gram Stain   Final    RARE WBC PRESENT,BOTH PMN AND MONONUCLEAR NO ORGANISMS SEEN Performed at [facility] Gram Stain Report Called to,Read Back By and Verified With: Gram Stain Report Called to,Read Back By and Verified With: D GLEBER RD [id number] 06/06/15 A BROWNING Performed at [facility]    Culture   Final    NO GROWTH 1 DAY Performed at [facility]    Report Status PENDING  Incomplete  Anaerobic culture     Status: None (Preliminary result)   Collection Time: 06/06/15  4:51 PM  Result Value Ref Range Status   Specimen Description WOUND  Final   Special Requests LUMBAR  Final   Gram Stain   Final    RARE WBC PRESENT,BOTH PMN AND MONONUCLEAR NO SQUAMOUS EPITHELIAL CELLS SEEN NO ORGANISMS SEEN Performed at [facility] Performed at [facility]    Culture   Final    NO ANAEROBES ISOLATED; CULTURE IN PROGRESS FOR 5 DAYS Performed at [facility]    Report Status PENDING  Incomplete  Gram stain     Status: None   Collection Time: 06/06/15  4:51 PM  Result Value Ref Range Status   Specimen Description WOUND  Final   Special Requests LUMBAR  Final   Gram Stain     Final    WBC PRESENT,BOTH PMN AND MONONUCLEAR NO ORGANISMS SEEN    Report Status 06/06/2015 FINAL  Final  Wound culture     Status: None (Preliminary result)   Collection Time: 06/06/15  4:51 PM  Result Value Ref Range Status   Specimen Description WOUND  Final   Special Requests LUMBAR  Final   Gram Stain   Final    RARE WBC PRESENT,BOTH PMN AND MONONUCLEAR NO SQUAMOUS EPITHELIAL CELLS SEEN NO ORGANISMS SEEN Performed at [facility] Performed at [facility]    Culture   Final    NO GROWTH 1 DAY Performed at [facility]    Report Status PENDING  Incomplete    Studies/Results: No results found.   Assessment/Plan: Discitis Would continue vanco/ceftriaxone for 6 weeks unless his Cx dictate otherwise.  I will ask my partners to f/u Cx over w/e.  PIC placed CM has seen pt F/u ESR and CPR as outpt  Constipation Has had BM in last 24h  L4-5 Laminectomy 05-2015 Total days of antibiotics: 1 vanco/ceftriaxone           Infectious Diseases (pager) [phone] [email or web address] 06/08/2015, 11:03 AM  LOS: 2 days      

## 2015-06-08 NOTE — Discharge Summary (Signed)
Physician Discharge Summary  Patient ID: Timothy Duncan MRN: [id number] DOB/AGE: 1940/12/28 74 y.o.  Admit date: 06/06/2015 Discharge date: 06/08/2015  Admission Diagnoses:  Discharge Diagnoses:  Active Problems:   S/P lumbar laminectomy   Discharged Condition: good  Hospital Course: Patient admitted to the hospital with increasing back pain postoperatively. 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 Patient underwent wound reexploration. No active infection definitely identified. Cultures taken which are negative so far. Plan is for discharge home on continuing IV antibiotics with a planned 6 week course of IV vancomycin.  Consults:   Significant Diagnostic Studies:   Treatments:   Discharge Exam: Blood pressure 147/71, pulse 86, temperature 98.5 F (36.9 C), temperature source Oral, resp. rate 20, height 5' 10.5" (1.791 m), weight 101.606 kg (224 lb), SpO2 98 %. Awake and alert. Oriented and appropriate. Wound clean and dry. Motor and sensory function intact. Chest and abdomen benign.  Disposition: 01-Home or Self Care  Discharge Instructions    Face-to-face encounter (required for Medicare/Medicaid patients)    Complete by:  As directed   I Julianne Chamberlin A certify that this patient is under my care and that I, or a nurse practitioner or physician's assistant working with me, had a face-to-face encounter that meets the physician face-to-face encounter requirements with this patient on 06/08/2015. The encounter with the patient was in whole, or in part for the following medical condition(s) which is the primary reason for home health care (List medical condition): Vertebral osteomyelitis discitis  The encounter with the patient was in whole, or in part, for the following medical condition, which is the primary reason for home health care:  Osteomyelitis discitis  I certify that, based on my findings, the following services are medically necessary home health services:  Nursing  Reason  for Medically Necessary Home Health Services:  Skilled Nursing- Assessment and Training for Infusion Therapy, Line Care, and Infection Control  My clinical findings support the need for the above services:  Pain interferes with ambulation/mobility  Further, I certify that my clinical findings support that this patient is homebound due to:  Unable to leave home safely without assistance     Home Health    Complete by:  As directed   To provide the following care/treatments:  RN            Medication List    TAKE these medications        benzonatate 200 MG capsule  Commonly known as:  TESSALON  Take 1 capsule (200 mg total) by mouth 3 (three) times daily as needed for cough.     cefUROXime 500 MG tablet  Commonly known as:  CEFTIN  Take 1 tablet (500 mg total) by mouth 2 (two) times daily with a meal.     HYDROcodone-acetaminophen 10-325 MG tablet  Commonly known as:  NORCO  Take 1-2 tablets by mouth every 6 (six) hours as needed.     HYDROcodone-homatropine 5-1.5 MG/5ML syrup  Commonly known as:  HYCODAN  Take 5 mLs by mouth every 6 (six) hours as needed for cough.     losartan-hydrochlorothiazide 50-12.5 MG tablet  Commonly known as:  HYZAAR  Take 1/2 tablet by mouth in the morning     sildenafil 100 MG tablet  Commonly known as:  VIAGRA  Take 1 tablet (100 mg total) by mouth as needed for erectile dysfunction.     tiZANidine 4 MG tablet  Commonly known as:  ZANAFLEX  Take 1 tablet (4 mg total) by mouth every  6 (six) hours as needed for muscle spasms.     vancomycin 1 GM/200ML Soln  Commonly known as:  VANCOCIN  Inject 200 mLs (1,000 mg total) into the vein every 12 (twelve) hours.           Follow-up Information    Follow up with Charlie Pitter, MD.   Specialty:  Neurosurgery   Contact information:   [address] [place] [address] [phone]       Signed: Charlie Pitter 06/08/2015, 12:10 PM

## 2015-06-08 NOTE — Progress Notes (Signed)
RN discussed discharge instructions with patient. Patient vocalized understanding of discharge instructions including follow up appointment, pain management and control, home health set up. Patient awaiting transportation home. Patient given discharge instructions and prescription medications. Will continue to monitor closely.

## 2015-06-08 NOTE — Progress Notes (Signed)
Initial Nutrition Assessment  DOCUMENTATION CODES:   Obesity unspecified  INTERVENTION:    Encourage PO's.  MVI daily.  NUTRITION DIAGNOSIS:   Unintentional weight loss related to poor appetite as evidenced by per patient/family report.  GOAL:   Patient will meet greater than or equal to 90% of their needs  MONITOR:   PO intake, Labs, Weight trends  REASON FOR ASSESSMENT:   Malnutrition Screening Tool    ASSESSMENT:   Patient admitted to the hospital with increasing back pain postoperatively. 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 Patient underwent wound reexploration. No active infection definitely identified. Cultures taken which are negative so far. Plan is for discharge home on continuing IV antibiotics with a planned 6 week course of IV vancomycin.  Patient reports he has been eating poorly. He is glad that he has lost some weight (9% weight loss within the past 1.5 months is significant). He refuses supplements. Plans for d/c home today.   Diet Order:  Diet regular Room service appropriate?: Yes; Fluid consistency:: Thin  Skin:  Reviewed, no issues  Last BM:  12/6  Height:   Ht Readings from Last 1 Encounters:  06/06/15 5' 10.5" (1.791 m)    Weight:   Wt Readings from Last 1 Encounters:  06/06/15 224 lb (101.606 kg)    Ideal Body Weight:  76.8 kg  BMI:  Body mass index is 31.68 kg/(m^2).  Estimated Nutritional Needs:   Kcal:  1900-2100  Protein:  100-110 gm  Fluid:  >/= 2 L  EDUCATION NEEDS:   No education needs identified at this time  Molli Barrows, [place], [place], [place] Pager [phone] After Hours Pager [phone]

## 2015-06-08 NOTE — Care Management Note (Signed)
Case Management Note  Patient Details  Name: Timothy Duncan MRN: [id number] Date of Birth: Jan 03, 1941  Subjective/Objective:                    Action/Plan: Plan is for patient to discharge home with IV antibiotic therapy. Patient has received his PICC line. CM met with the patient and provided him a list of home health agencies in the [facility] area. Patient selected [place]. Miranda and Pam with [facility] notified and accepted the referral. Patient already has a walker and does not want the ordered walker. CM will continue to follow for further discharge needs.   Expected Discharge Date:                  Expected Discharge Plan:  [place]  In-House Referral:     Discharge planning Services  CM Consult  Post Acute Care Choice:  Durable Medical Equipment, Home Health Choice offered to:  Patient  DME Arranged:  Walker rolling DME Agency:     HH Arranged:  RN [place] Agency:  [place]  Status of Service:  In process, will continue to follow  Medicare Important Message Given:    Date Medicare IM Given:    Medicare IM give by:    Date Additional Medicare IM Given:    Additional Medicare Important Message give by:     If discussed at [place] of Stay Meetings, dates discussed:    Additional Comments:  Pollie Friar, RN 06/08/2015, 11:24 AM

## 2015-06-08 NOTE — Discharge Instructions (Signed)
Bone and Joint Infections, Adult °Bone infections (osteomyelitis) and joint infections (septic arthritis) occur when bacteria or other germs get inside a bone or a joint. This can happen if you have an infection in another part of your body that spreads through your blood. Germs from your skin or from outside of your body can also cause this type of infection if you have a wound or a broken bone (fracture) that breaks the skin. °Anyone can get a bone infection or joint infection. You may be more likely to get this type of infection if you have a condition, such as diabetes, that lowers your ability to fight infection or increases your chances of getting an infection. Bone and joint infections can cause damage, and they can spread to other areas of your body. They need to be treated quickly. °CAUSES °Most bone and joint infections are caused by bacteria. They can also be caused by other germs, such as viruses and funguses. °RISK FACTORS °This condition is more likely to develop in: °· People who recently had surgery, especially bone or joint surgery. °· People who have a long-term (chronic) disease, such as: °¨ HIV (human immunodeficiency virus). °¨ Diabetes. °¨ Rheumatoid arthritis. °¨ Sickle cell anemia. °· Elderly people. °· People who take medicines that block or weaken the body's defense system (immune system). °· People who have a condition that reduces their blood flow. °· People who are on kidney dialysis. °· People who have an artificial joint. °· People who have had a joint or bone repaired with plates or screws (surgical hardware). °· People who use or abuse IV drugs. °· People who have had trauma, such as stepping on a nail. °SYMPTOMS °Symptoms vary depending on the type and location of your infection. Common symptoms of bone and joint infections include: °· Fever and chills. °· Redness and warmth. °· Swelling. °· Pain and stiffness. °· Drainage of fluid or pus near the infection. °· Weight loss and  fatigue. °· Decreased ability to use a hand or foot. °DIAGNOSIS °This condition may be diagnosed based on symptoms, medical history, a physical exam, and diagnostic tests. Tests can help to identify the cause of the infection. You may have various tests, such as: °· A sample of tissue, fluid, or blood taken to be examined under a microscope. °· A procedure to remove fluid from the infected joint with a needle (joint aspiration) for testing in a lab. °· Pus or discharge swabbed from a wound for testing to identify germs and to determine what type of medicine will kill them (culture and sensitivity). °· Blood tests to look for evidence of infection and inflammation (biomarkers). °· Imaging studies to determine how severe the bone or joint infection is. These may include: °¨ X-rays. °¨ CT scan. °¨ MRI. °¨ Bone scan. °TREATMENT °Treatment depends on the cause and type of infection. Antibiotic medicines are usually the first treatment for a bone or joint infection. Treatment with antibiotics may include: °· Getting IV antibiotics. This may be done in a hospital at first. You may have to continue IV antibiotics at home for several weeks. You may also have to take antibiotics by mouth for several weeks after that. °· Taking more than one kind of antibiotic. Treatment may start with a type of antibiotic that works against many different bacteria (broad spectrum antibiotics). IV antibiotics may be changed if tests show that another type may work better. °Other treatments may include: °· Draining fluid from the joint by placing a needle into   it (aspiration). °· Surgery to remove: °¨ Dead or dying tissue from a bone or joint. °¨ An infected artificial joint. °¨ Infected plates or screws that were used to repair a broken bone. °HOME CARE INSTRUCTIONS °· Take medicines only as directed by your health care provider. °· Take your antibiotic medicine as directed by your health care provider. Finish the antibiotic even if you start  to feel better. °· Follow instructions from your health care provider about how to take IV antibiotics at home. °· Ask your health care provider if you have any restrictions on your activities. °· Keep all follow-up visits as directed by your health care provider. This is important. °SEEK MEDICAL CARE IF: °· You have a fever or chills. °· You have redness, warmth, pain, or swelling that returns after treatment. °SEEK IMMEDIATE MEDICAL CARE IF: °· You have rapid breathing or you have trouble breathing. °· You have chest pain. °· You cannot drink fluids or make urine. °· The affected arm or leg swells, changes color, or turns blue. °  °This information is not intended to replace advice given to you by your health care provider. Make sure you discuss any questions you have with your health care provider. °  °Document Released: 06/16/2005 Document Revised: 10/31/2014 Document Reviewed: 06/14/2014 °Elsevier Interactive Patient Education ©2016 [employer] ° °

## 2015-06-09 DIAGNOSIS — G061 Intraspinal abscess and granuloma: Secondary | ICD-10-CM | POA: Diagnosis not present

## 2015-06-09 DIAGNOSIS — M4646 Discitis, unspecified, lumbar region: Secondary | ICD-10-CM | POA: Diagnosis not present

## 2015-06-09 LAB — WOUND CULTURE: Culture: NO GROWTH

## 2015-06-10 LAB — TISSUE CULTURE: CULTURE: NO GROWTH

## 2015-06-11 LAB — ANAEROBIC CULTURE

## 2015-06-12 DIAGNOSIS — Z5181 Encounter for therapeutic drug level monitoring: Secondary | ICD-10-CM | POA: Diagnosis not present

## 2015-06-12 DIAGNOSIS — R6889 Other general symptoms and signs: Secondary | ICD-10-CM | POA: Diagnosis not present

## 2015-06-13 NOTE — Anesthesia Postprocedure Evaluation (Signed)
Anesthesia Post Note  Patient: WHITLEY ZARLING  Procedure(s) Performed: Procedure(s) (LRB): Presumed discitis, Lumbar exploration,Repeat discectomy Lumbar four-five with deep tissue culture (N/A)  Patient location during evaluation: PACU Anesthesia Type: General Level of consciousness: awake and alert Pain management: pain level controlled Vital Signs Assessment: post-procedure vital signs reviewed and stable Respiratory status: spontaneous breathing, nonlabored ventilation, respiratory function stable and patient connected to nasal cannula oxygen Cardiovascular status: blood pressure returned to baseline and stable Postop Assessment: no signs of nausea or vomiting Anesthetic complications: no    Last Vitals:  Filed Vitals:   06/08/15 0932 06/08/15 1410  BP: 147/71 133/63  Pulse: 86 77  Temp: 36.9 C 36.6 C  Resp: 20 18    Last Pain:  Filed Vitals:   06/08/15 1411  PainSc: 2                  Tiari Andringa,JAMES TERRILL

## 2015-06-18 DIAGNOSIS — M4646 Discitis, unspecified, lumbar region: Secondary | ICD-10-CM | POA: Diagnosis not present

## 2015-06-18 DIAGNOSIS — R6889 Other general symptoms and signs: Secondary | ICD-10-CM | POA: Diagnosis not present

## 2015-06-18 DIAGNOSIS — M4636 Infection of intervertebral disc (pyogenic), lumbar region: Secondary | ICD-10-CM | POA: Diagnosis not present

## 2015-06-22 DIAGNOSIS — G061 Intraspinal abscess and granuloma: Secondary | ICD-10-CM | POA: Diagnosis not present

## 2015-06-22 DIAGNOSIS — M4646 Discitis, unspecified, lumbar region: Secondary | ICD-10-CM | POA: Diagnosis not present

## 2015-06-23 DIAGNOSIS — M4646 Discitis, unspecified, lumbar region: Secondary | ICD-10-CM | POA: Diagnosis not present

## 2015-06-23 DIAGNOSIS — G061 Intraspinal abscess and granuloma: Secondary | ICD-10-CM | POA: Diagnosis not present

## 2015-06-25 DIAGNOSIS — Z5181 Encounter for therapeutic drug level monitoring: Secondary | ICD-10-CM | POA: Diagnosis not present

## 2015-06-25 DIAGNOSIS — R6889 Other general symptoms and signs: Secondary | ICD-10-CM | POA: Diagnosis not present

## 2015-06-27 ENCOUNTER — Other Ambulatory Visit: Payer: Self-pay | Admitting: Internal Medicine

## 2015-06-27 ENCOUNTER — Other Ambulatory Visit: Payer: Medicare HMO

## 2015-06-27 ENCOUNTER — Telehealth: Payer: Self-pay | Admitting: *Deleted

## 2015-06-27 DIAGNOSIS — E875 Hyperkalemia: Secondary | ICD-10-CM

## 2015-06-27 NOTE — Telephone Encounter (Signed)
Received lab results from [facility] and elevated potassium of 6.1. Spoke to [place] at [facility] and she said that [place] did not pick up their labs on time over the weekend and they had a lot of elevated potassium. Per Dr. Linus Salmons order given to repeat a BMP and if still 6 or above patient needs to go to the ED. [facility] will run the test STAT and have the results before the end of the day. Myrtis Hopping

## 2015-06-27 NOTE — Telephone Encounter (Signed)
Patient is private pay and it was going to cost $150.00 for [facility] to go out; so he will come here to have the lab work repeated. Myrtis Hopping

## 2015-06-28 DIAGNOSIS — Z5181 Encounter for therapeutic drug level monitoring: Secondary | ICD-10-CM | POA: Diagnosis not present

## 2015-06-28 DIAGNOSIS — R6889 Other general symptoms and signs: Secondary | ICD-10-CM | POA: Diagnosis not present

## 2015-06-28 NOTE — Telephone Encounter (Signed)
Patient did not come for his lab work yesterday, I called him and he said he tried to find our address for an hour yesterday and could not. He suggested we send someone to his office and get his blood. Called and spoke to Midwife at [place] and she will wave the cost to go out to patient's home to redraw the lab. Called and notified patient of this and that [facility] would be calling him. Myrtis Hopping

## 2015-06-30 DIAGNOSIS — G061 Intraspinal abscess and granuloma: Secondary | ICD-10-CM | POA: Diagnosis not present

## 2015-06-30 DIAGNOSIS — M4646 Discitis, unspecified, lumbar region: Secondary | ICD-10-CM | POA: Diagnosis not present

## 2015-07-01 DIAGNOSIS — I82409 Acute embolism and thrombosis of unspecified deep veins of unspecified lower extremity: Secondary | ICD-10-CM

## 2015-07-01 DIAGNOSIS — G061 Intraspinal abscess and granuloma: Secondary | ICD-10-CM | POA: Diagnosis not present

## 2015-07-01 DIAGNOSIS — M4646 Discitis, unspecified, lumbar region: Secondary | ICD-10-CM | POA: Diagnosis not present

## 2015-07-01 HISTORY — DX: Acute embolism and thrombosis of unspecified deep veins of unspecified lower extremity: I82.409

## 2015-07-03 ENCOUNTER — Encounter: Payer: Self-pay | Admitting: Infectious Diseases

## 2015-07-03 DIAGNOSIS — R6889 Other general symptoms and signs: Secondary | ICD-10-CM | POA: Diagnosis not present

## 2015-07-04 DIAGNOSIS — R6889 Other general symptoms and signs: Secondary | ICD-10-CM | POA: Diagnosis not present

## 2015-07-06 ENCOUNTER — Emergency Department (HOSPITAL_COMMUNITY)
Admission: EM | Admit: 2015-07-06 | Discharge: 2015-07-06 | Disposition: A | Discharge status: Home / Self Care | Payer: Medicare HMO | Attending: Emergency Medicine | Admitting: Emergency Medicine

## 2015-07-06 ENCOUNTER — Encounter (HOSPITAL_COMMUNITY): Payer: Self-pay | Admitting: *Deleted

## 2015-07-06 ENCOUNTER — Ambulatory Visit (HOSPITAL_BASED_OUTPATIENT_CLINIC_OR_DEPARTMENT_OTHER)
Admission: RE | Admit: 2015-07-06 | Discharge: 2015-07-06 | Disposition: A | Discharge status: Home / Self Care | Payer: Medicare HMO | Source: Ambulatory Visit | Attending: Neurological Surgery | Admitting: Neurological Surgery

## 2015-07-06 ENCOUNTER — Other Ambulatory Visit (HOSPITAL_COMMUNITY): Payer: Self-pay | Admitting: Neurological Surgery

## 2015-07-06 DIAGNOSIS — R609 Edema, unspecified: Secondary | ICD-10-CM

## 2015-07-06 DIAGNOSIS — Z87438 Personal history of other diseases of male genital organs: Secondary | ICD-10-CM | POA: Diagnosis not present

## 2015-07-06 DIAGNOSIS — I82401 Acute embolism and thrombosis of unspecified deep veins of right lower extremity: Secondary | ICD-10-CM | POA: Insufficient documentation

## 2015-07-06 DIAGNOSIS — M79604 Pain in right leg: Secondary | ICD-10-CM | POA: Diagnosis present

## 2015-07-06 DIAGNOSIS — Z792 Long term (current) use of antibiotics: Secondary | ICD-10-CM | POA: Insufficient documentation

## 2015-07-06 DIAGNOSIS — I824Z1 Acute embolism and thrombosis of unspecified deep veins of right distal lower extremity: Secondary | ICD-10-CM | POA: Diagnosis not present

## 2015-07-06 DIAGNOSIS — I10 Essential (primary) hypertension: Secondary | ICD-10-CM | POA: Insufficient documentation

## 2015-07-06 DIAGNOSIS — Z87891 Personal history of nicotine dependence: Secondary | ICD-10-CM | POA: Insufficient documentation

## 2015-07-06 DIAGNOSIS — Z7901 Long term (current) use of anticoagulants: Secondary | ICD-10-CM | POA: Insufficient documentation

## 2015-07-06 LAB — BASIC METABOLIC PANEL
Anion gap: 10 (ref 5–15)
BUN: 11 mg/dL (ref 6–20)
CHLORIDE: 105 mmol/L (ref 101–111)
CO2: 27 mmol/L (ref 22–32)
CREATININE: 1.02 mg/dL (ref 0.61–1.24)
Calcium: 9.4 mg/dL (ref 8.9–10.3)
GFR calc non Af Amer: >60 mL/min (ref >60)
Glucose, Bld: 91 mg/dL (ref 65–99)
POTASSIUM: 4 mmol/L (ref 3.5–5.1)
SODIUM: 142 mmol/L (ref 135–145)

## 2015-07-06 LAB — CBC
HEMATOCRIT: 37.3 % — AB (ref 39.0–52.0)
HEMOGLOBIN: 12 g/dL — AB (ref 13.0–17.0)
MCH: 29 pg (ref 26.0–34.0)
MCHC: 32.2 g/dL (ref 30.0–36.0)
MCV: 90.1 fL (ref 78.0–100.0)
Platelets: 376 10*3/uL (ref 150–400)
RBC: 4.14 MIL/uL — AB (ref 4.22–5.81)
RDW: 14.4 % (ref 11.5–15.5)
WBC: 8.3 10*3/uL (ref 4.0–10.5)

## 2015-07-06 MED ORDER — XARELTO VTE STARTER PACK 15 & 20 MG PO TBPK: 15.0000 mg | ORAL_TABLET | ORAL | Status: DC

## 2015-07-06 NOTE — Discharge Instructions (Signed)
Return if you pass out, have chest pain or trouble breathing Deep Vein Thrombosis A deep vein thrombosis (DVT) is a blood clot (thrombus) that usually occurs in a deep, larger vein of the lower leg or the pelvis, or in an upper extremity such as the arm. These are dangerous and can lead to serious and even life-threatening complications if the clot travels to the lungs. A DVT can damage the valves in your leg veins so that instead of flowing upward, the blood pools in the lower leg. This is called post-thrombotic syndrome, and it can result in pain, swelling, discoloration, and sores on the leg. CAUSES A DVT is caused by the formation of a blood clot in your leg, pelvis, or arm. Usually, several things contribute to the formation of blood clots. A clot may develop when:  Your blood flow slows down.  Your vein becomes damaged in some way.  You have a condition that makes your blood clot more easily. RISK FACTORS A DVT is more likely to develop in:  People who are older, especially over 19 years of age.  People who are overweight (obese).  People who sit or lie still for a long time, such as during long-distance travel (over 4 hours), bed rest, hospitalization, or during recovery from certain medical conditions like a stroke.  People who do not engage in much physical activity (sedentary lifestyle).  People who have chronic breathing disorders.  People who have a personal or family history of blood clots or blood clotting disease.  People who have peripheral vascular disease (PVD), diabetes, or some types of cancer.  People who have heart disease, especially if the person had a recent heart attack or has congestive heart failure.  People who have neurological diseases that affect the legs (leg paresis).  People who have had a traumatic injury, such as breaking a hip or leg.  People who have recently had major or lengthy surgery, especially on the hip, knee, or abdomen.  People who  have had a central line placed inside a large vein.  People who take medicines that contain the hormone estrogen. These include birth control pills and hormone replacement therapy.  Pregnancy or during childbirth or the postpartum period.  Long plane flights (over 8 hours). SIGNS AND SYMPTOMS Symptoms of a DVT can include:   Swelling of your leg or arm, especially if one side is much worse.  Warmth and redness of your leg or arm, especially if one side is much worse.  Pain in your arm or leg. If the clot is in your leg, symptoms may be more noticeable or worse when you stand or walk.  A feeling of pins and needles, if the clot is in the arm. The symptoms of a DVT that has traveled to the lungs (pulmonary embolism, PE) usually start suddenly and include:  Shortness of breath while active or at rest.  Coughing or coughing up blood or blood-tinged mucus.  Chest pain that is often worse with deep breaths.  Rapid or irregular heartbeat.  Feeling light-headed or dizzy.  Fainting.  Feeling anxious.  Sweating. There may also be pain and swelling in a leg if that is where the blood clot started. These symptoms may represent a serious problem that is an emergency. Do not wait to see if the symptoms will go away. Get medical help right away. Call your local emergency services (911 in the [place] Do not drive yourself to the hospital. DIAGNOSIS Your health care provider will take  a medical history and perform a physical exam. You may also have other tests, including:  Blood tests to assess the clotting properties of your blood.  Imaging tests, such as CT, ultrasound, MRI, X-ray, and other tests to see if you have clots anywhere in your body. TREATMENT After a DVT is identified, it can be treated. The type of treatment that you receive depends on many factors, such as the cause of your DVT, your risk for bleeding or developing more clots, and other medical conditions that you have.  Sometimes, a combination of treatments is necessary. Treatment options may be combined and include:  Monitoring the blood clot with ultrasound.  Taking medicines by mouth, such as newer blood thinners (anticoagulants), thrombolytics, or warfarin.  Taking anticoagulant medicine by injection or through an IV tube.  Wearing compression stockings or using different types ofdevices.  Surgery (rare) to remove the blood clot or to place a filter in your abdomen to stop the blood clot from traveling to your lungs. Treatments for a DVT are often divided into immediate treatment and long-term treatment (up to 3 months after DVT). You can work with your health care provider to choose the treatment program that is best for you. HOME CARE INSTRUCTIONS If you are taking a newer oral anticoagulant:  Take the medicine every single day at the same time each day.  Understand what foods and drugs interact with this medicine.  Understand that there are no regular blood tests required when using this medicine.  Understand the side effects of this medicine, including excessive bruising or bleeding. Ask your health care provider or pharmacist about other possible side effects. If you are taking warfarin:  Understand how to take warfarin and know which foods can affect how warfarin works in Veterinary surgeon.  Understand that it is dangerous to take too much or too little warfarin. Too much warfarin increases the risk of bleeding. Too little warfarin continues to allow the risk for blood clots.  Follow your PT and INR blood testing schedule. The PT and INR results allow your health care provider to adjust your dose of warfarin. It is very important that you have your PT and INR tested as often as told by your health care provider.  Avoid major changes in your diet, or tell your health care provider before you change your diet. Arrange a visit with a registered dietitian to answer your questions. Many foods,  especially foods that are high in vitamin K, can interfere with warfarin and affect the PT and INR results. Eat a consistent amount of foods that are high in vitamin K, such as:  Spinach, kale, broccoli, cabbage, collard greens, turnip greens, Brussels sprouts, peas, cauliflower, seaweed, and parsley.  Beef liver and pork liver.  Green tea.  Soybean oil.  Tell your health care provider about any and all medicines, vitamins, and supplements that you take, including aspirin and other over-the-counter anti-inflammatory medicines. Be especially cautious with aspirin and anti-inflammatory medicines. Do not take those before you ask your health care provider if it is safe to do so. This is important because many medicines can interfere with warfarin and affect the PT and INR results.  Do not start or stop taking any over-the-counter or prescription medicine unless your health care provider or pharmacist tells you to do so. If you take warfarin, you will also need to do these things:  Hold pressure over cuts for longer than usual.  Tell your dentist and other health care providers that  you are taking warfarin before you have any procedures in which bleeding may occur.  Avoid alcohol or drink very small amounts. Tell your health care provider if you change your alcohol intake.  Do not use tobacco products, including cigarettes, chewing tobacco, and e-cigarettes. If you need help quitting, ask your health care provider.  Avoid contact sports. General Instructions  Take over-the-counter and prescription medicines only as told by your health care provider. Anticoagulant medicines can have side effects, including easy bruising and difficulty stopping bleeding. If you are prescribed an anticoagulant, you will also need to do these things:  Hold pressure over cuts for longer than usual.  Tell your dentist and other health care providers that you are taking anticoagulants before you have any  procedures in which bleeding may occur.  Avoid contact sports.  Wear a medical alert bracelet or carry a medical alert card that says you have had a PE.  Ask your health care provider how soon you can go back to your normal activities. Stay active to prevent new blood clots from forming.  Make sure to exercise while traveling or when you have been sitting or standing for a long period of time. It is very important to exercise. Exercise your legs by walking or by tightening and relaxing your leg muscles often. Take frequent walks.  Wear compression stockings as told by your health care provider to help prevent more blood clots from forming.  Do not use tobacco products, including cigarettes, chewing tobacco, and e-cigarettes. If you need help quitting, ask your health care provider.  Keep all follow-up appointments with your health care provider. This is important. PREVENTION Take these actions to decrease your risk of developing another DVT:  Exercise regularly. For at least 30 minutes every day, engage in:  Activity that involves moving your arms and legs.  Activity that encourages good blood flow through your body by increasing your heart rate.  Exercise your arms and legs every hour during long-distance travel (over 4 hours). Drink plenty of water and avoid drinking alcohol while traveling.  Avoid sitting or lying in bed for long periods of time without moving your legs.  Maintain a weight that is appropriate for your height. Ask your health care provider what weight is healthy for you.  If you are a woman who is over 53 years of age, avoid unnecessary use of medicines that contain estrogen. These include birth control pills.  Do not smoke, especially if you take estrogen medicines. If you need help quitting, ask your health care provider. If you are hospitalized, prevention measures may include:  Early walking after surgery, as soon as your health care provider says that it is  safe.  Receiving anticoagulants to prevent blood clots.If you cannot take anticoagulants, other options may be available, such as wearing compression stockings or using different types of devices. SEEK IMMEDIATE MEDICAL CARE IF:  You have new or increased pain, swelling, or redness in an arm or leg.  You have numbness or tingling in an arm or leg.  You have shortness of breath while active or at rest.  You have chest pain.  You have a rapid or irregular heartbeat.  You feel light-headed or dizzy.  You cough up blood.  You notice blood in your vomit, bowel movement, or urine. These symptoms may represent a serious problem that is an emergency. Do not wait to see if the symptoms will go away. Get medical help right away. Call your local emergency services (911  in the [place] Do not drive yourself to the hospital.   This information is not intended to replace advice given to you by your health care provider. Make sure you discuss any questions you have with your health care provider.   Document Released: 06/16/2005 Document Revised: 03/07/2015 Document Reviewed: 10/11/2014 Elsevier Interactive Patient Education [employer].

## 2015-07-06 NOTE — Progress Notes (Signed)
*  Preliminary Results* Bilateral lower extremity venous duplex completed. Bilateral lower extremities are positive for acute deep vein thrombosis involving the right saphenofemoral junction, right common femoral vein, and a single left peroneal vein. There is no evidence of Baker's cyst bilaterally.  Preliminary results discussed with Butch Penny, who spoke with Dr. Ronnald Ramp. Dr. Ronnald Ramp has instructed the patient to visit the emergency department for treatment.  07/06/2015 3:25 PM  Maudry Mayhew, RVT, RDCS, RDMS

## 2015-07-06 NOTE — ED Notes (Signed)
Pt had vascular study due to right leg pain for over a week and sent here due to + DVT. No acute distress noted at triage.

## 2015-07-06 NOTE — ED Provider Notes (Signed)
CSN: [id number]     Arrival date & time 07/06/15  1540 History   First MD Initiated Contact with Patient 07/06/15 1728     Chief Complaint  Patient presents with  . DVT     (Consider location/radiation/quality/duration/timing/severity/associated sxs/prior Treatment) Patient is a 75 y.o. male presenting with leg pain. The history is provided by the patient.  Leg Pain Location:  Leg Time since incident:  2 days Injury: no   Leg location:  R leg Pain details:    Quality:  Aching and dull   Radiates to:  Does not radiate   Severity:  Moderate   Onset quality:  Sudden   Duration:  2 days   Timing:  Constant   Progression:  Worsening Chronicity:  New Foreign body present:  No foreign bodies Relieved by:  Nothing Worsened by:  Nothing tried Ineffective treatments:  None tried Associated symptoms: no fever    75 yo M with R leg pain. Started a couple days ago.  Had a picc line for possible epidural abscess.  Denies other dvt risk factors.  Denies hx of Cancer.  DVT study ordered by neurosurgery.   Past Medical History  Diagnosis Date  . Hypertension   . ED (erectile dysfunction)    Past Surgical History  Procedure Laterality Date  . Cataract extraction    . Appendectomy    . Tonsillectomy    . Colonoscopy    . Lumbar laminectomy/decompression microdiscectomy Right 05/03/2015    Procedure: Microdiscectomy - Lumbar four-Lumbar five - right ;  Surgeon: Eustace Moore, MD;  Location: [place] [facility];  Service: Neurosurgery;  Laterality: Right;  . Wound exploration N/A 06/06/2015    Procedure: Presumed discitis, Lumbar exploration,Repeat discectomy Lumbar four-five with deep tissue culture;  Surgeon: Eustace Moore, MD;  Location: [place] [facility];  Service: Neurosurgery;  Laterality: N/A;   History reviewed. No pertinent family history. Social History  Substance Use Topics  . Smoking status: Former Research scientist (life sciences)  . Smokeless tobacco: Never Used  . Alcohol Use: No    Review of Systems   Constitutional: Negative for fever and chills.  HENT: Negative for congestion and facial swelling.   Eyes: Negative for discharge and visual disturbance.  Respiratory: Negative for shortness of breath.   Cardiovascular: Negative for chest pain and palpitations.  Gastrointestinal: Negative for vomiting, abdominal pain and diarrhea.  Musculoskeletal: Negative for myalgias and arthralgias.  Skin: Negative for color change and rash.  Neurological: Negative for tremors, syncope and headaches.  Psychiatric/Behavioral: Negative for confusion and dysphoric mood.      Allergies  Review of patient's allergies indicates no known allergies.  Home Medications   Prior to Admission medications   Medication Sig Start Date End Date Taking? Authorizing Provider  benzonatate (TESSALON) 200 MG capsule Take 1 capsule (200 mg total) by mouth 3 (three) times daily as needed for cough. Patient not taking: Reported on 11/21/2014 11/16/14   Dorothyann Peng, NP  cefTRIAXone 2 g in dextrose 5 % 50 mL Inject 2 g into the vein daily. 06/08/15   Earnie Larsson, MD  cefUROXime (CEFTIN) 500 MG tablet Take 1 tablet (500 mg total) by mouth 2 (two) times daily with a meal. Patient not taking: Reported on 04/24/2015 11/21/14   Dorothyann Peng, NP  HYDROcodone-acetaminophen (NORCO) 10-325 MG tablet Take 1-2 tablets by mouth every 6 (six) hours as needed. 06/08/15   Earnie Larsson, MD  HYDROcodone-homatropine [facility]) 5-1.5 MG/5ML syrup Take 5 mLs by mouth every 6 (six) hours  as needed for cough. Patient not taking: Reported on 04/24/2015 11/21/14   Dorothyann Peng, NP  losartan-hydrochlorothiazide [facility]) 50-12.5 MG per tablet Take 1/2 tablet by mouth in the morning Patient not taking: Reported on 06/05/2015 08/01/14   Dorena Cookey, MD  sildenafil (VIAGRA) 100 MG tablet Take 1 tablet (100 mg total) by mouth as needed for erectile dysfunction. Patient not taking: Reported on 04/24/2015 08/01/14 08/01/15  Dorena Cookey, MD  tiZANidine  (ZANAFLEX) 4 MG tablet Take 1 tablet (4 mg total) by mouth every 6 (six) hours as needed for muscle spasms. Patient not taking: Reported on 04/24/2015 03/26/15   Dorothyann Peng, NP  vancomycin (VANCOCIN) 1 GM/200ML SOLN Inject 200 mLs (1,000 mg total) into the vein every 12 (twelve) hours. 06/08/15   Earnie Larsson, MD  XARELTO STARTER PACK 15 & 20 MG TBPK Take 15-20 mg by mouth as directed. Take as directed on package: Start with one 15mg  tablet by mouth twice a day with food. On Day 22, switch to one 20mg  tablet once a day with food. 07/06/15   Deno Etienne, DO   BP 135/66 mmHg  Pulse 68  Temp(Src) 98.1 F (36.7 C) (Oral)  Resp 16  SpO2 98% Physical Exam  Constitutional: He is oriented to person, place, and time. He appears well-developed and well-nourished.  HENT:  Head: Normocephalic and atraumatic.  Eyes: EOM are normal. Pupils are equal, round, and reactive to light.  Neck: Normal range of motion. Neck supple. No JVD present.  Cardiovascular: Normal rate and regular rhythm.  Exam reveals no gallop and no friction rub.   No murmur heard. Pulmonary/Chest: No respiratory distress. He has no wheezes.  Abdominal: He exhibits no distension. There is no tenderness. There is no rebound and no guarding.  Musculoskeletal: Normal range of motion.  Neurological: He is alert and oriented to person, place, and time.  Skin: No rash noted. No pallor.  Psychiatric: He has a normal mood and affect. His behavior is normal.  Nursing note and vitals reviewed.   ED Course  Procedures (including critical care time) Labs Review Labs Reviewed  CBC - Abnormal; Notable for the following:    RBC 4.14 (*)    Hemoglobin 12.0 (*)    HCT 37.3 (*)    All other components within normal limits  BASIC METABOLIC PANEL    Imaging Review No results found. I have personally reviewed and evaluated these images and lab results as part of my medical decision-making.   EKG Interpretation None      MDM   Final  diagnoses:  DVT (deep venous thrombosis), right    75 yo M with cc RLE DVT. Likely secondary to picc line.  Start on xarelto.  PCP follow up.   6:57 PM:  I have discussed the diagnosis/risks/treatment options with the patient and family and believe the pt to be eligible for discharge home to follow-up with PCP. We also discussed returning to the ED immediately if new or worsening sx occur. We discussed the sx which are most concerning (e.g., sudden worsening pain, fever, bleeding of any kind.  Low back pain) that necessitate immediate return. Medications administered to the patient during their visit and any new prescriptions provided to the patient are listed below.  Medications given during this visit Medications - No data to display  Discharge Medication List as of 07/06/2015  5:47 PM    START taking these medications   Details  XARELTO STARTER PACK 15 & 20 MG TBPK  Take 15-20 mg by mouth as directed. Take as directed on package: Start with one 15mg  tablet by mouth twice a day with food. On Day 22, switch to one 20mg  tablet once a day with food., Starting 07/06/2015, Until Discontinued, Print        The patient appears reasonably screen and/or stabilized for discharge and I doubt any other medical condition or other [facility] requiring further screening, evaluation, or treatment in the ED at this time prior to discharge.      Deno Etienne, DO 07/06/15 1857

## 2015-07-10 ENCOUNTER — Telehealth: Payer: Self-pay | Admitting: Family Medicine

## 2015-07-10 DIAGNOSIS — M4646 Discitis, unspecified, lumbar region: Secondary | ICD-10-CM | POA: Diagnosis not present

## 2015-07-10 NOTE — Telephone Encounter (Signed)
Appointment made

## 2015-07-10 NOTE — Telephone Encounter (Signed)
Pt was put on xarelto dr Ronnald Ramp and per pt was told to follow up with his PCP. Can I create a slot?

## 2015-07-11 ENCOUNTER — Ambulatory Visit (INDEPENDENT_AMBULATORY_CARE_PROVIDER_SITE_OTHER): Payer: Medicare HMO | Admitting: Family Medicine

## 2015-07-11 ENCOUNTER — Encounter: Payer: Self-pay | Admitting: Family Medicine

## 2015-07-11 VITALS — BP 120/70 | Temp 98.1°F | Wt 209.0 lb

## 2015-07-11 DIAGNOSIS — I82409 Acute embolism and thrombosis of unspecified deep veins of unspecified lower extremity: Secondary | ICD-10-CM | POA: Insufficient documentation

## 2015-07-11 DIAGNOSIS — I82411 Acute embolism and thrombosis of right femoral vein: Secondary | ICD-10-CM

## 2015-07-11 MED ORDER — WARFARIN SODIUM 5 MG PO TABS: 5.0000 mg | ORAL_TABLET | Freq: Every day | ORAL | Status: DC

## 2015-07-11 NOTE — Progress Notes (Signed)
   Subjective:    Patient ID: Timothy Duncan, male    DOB: [birth year], 75 y.o.   MRN: [id number]  HPI Timothy Duncan is a 75 year old male nonsmoker who comes in today for follow-up of acute DVT  He presented the emergency room on January 6 with swelling of his right lower extremity. Doppler showed acute DVT involving the right common femoral vein in the left peroneal vein. He was placed on Zarrella toe. He comes in today for follow-up.  He's concerned about the potential side effects of Zarrella toe. He recently had back surgery and had to have a redo in his week and is concerned about falling.  We discussed the pluses and minuses of both medications and he would like to switch to Coumadin. Review of systems otherwise negative    We discussed in length bridging the Coumadin with the Zarrella toe.   Review of Systems    review of systems negative Objective:   Physical Exam Well-developed well-nourished male no acute distress vital signs stable he is afebrile       Assessment & Plan:  DVT right lower extremity January 9............ switch to Coumadin bridge with Zarrella toe,,,,,,,,,, return on Friday for a pro-time

## 2015-07-11 NOTE — Progress Notes (Signed)
Pre visit review using our clinic review tool, if applicable. No additional management support is needed unless otherwise documented below in the visit note. 

## 2015-07-11 NOTE — Patient Instructions (Signed)
Coumadin 5 mg......... 2 tabs tonight.......... one half tabs to 1........ then one tab daily at bedtime  Continue the xeralto....... one twice daily  Follow-up pro time on Friday here

## 2015-07-13 ENCOUNTER — Ambulatory Visit (INDEPENDENT_AMBULATORY_CARE_PROVIDER_SITE_OTHER): Payer: Medicare HMO | Admitting: *Deleted

## 2015-07-13 ENCOUNTER — Ambulatory Visit (INDEPENDENT_AMBULATORY_CARE_PROVIDER_SITE_OTHER): Payer: Medicare HMO | Admitting: General Practice

## 2015-07-13 DIAGNOSIS — I82411 Acute embolism and thrombosis of right femoral vein: Secondary | ICD-10-CM | POA: Diagnosis not present

## 2015-07-13 DIAGNOSIS — Z5181 Encounter for therapeutic drug level monitoring: Secondary | ICD-10-CM

## 2015-07-13 LAB — POCT INR: INR: 2.2

## 2015-07-13 NOTE — Patient Instructions (Signed)

## 2015-07-13 NOTE — Progress Notes (Signed)
Pre visit review using our clinic review tool, if applicable. No additional management support is needed unless otherwise documented below in the visit note. Patient new to coumadin and transitioning off of Xarelto.  INR check scheduled for Monday 1/16.

## 2015-07-13 NOTE — Progress Notes (Signed)
I have reviewed and agree with the plan. 

## 2015-07-16 ENCOUNTER — Ambulatory Visit (INDEPENDENT_AMBULATORY_CARE_PROVIDER_SITE_OTHER): Payer: Medicare HMO | Admitting: General Practice

## 2015-07-16 DIAGNOSIS — Z5181 Encounter for therapeutic drug level monitoring: Secondary | ICD-10-CM

## 2015-07-16 LAB — POCT INR: INR: 2.6

## 2015-07-16 NOTE — Progress Notes (Signed)
Pre visit review using our clinic review tool, if applicable. No additional management support is needed unless otherwise documented below in the visit note. Patient new to coumadin.  Please see patient instructions.

## 2015-07-16 NOTE — Patient Instructions (Signed)

## 2015-07-19 ENCOUNTER — Ambulatory Visit: Payer: Medicare HMO

## 2015-07-23 ENCOUNTER — Ambulatory Visit (INDEPENDENT_AMBULATORY_CARE_PROVIDER_SITE_OTHER): Payer: Medicare HMO | Admitting: General Practice

## 2015-07-23 DIAGNOSIS — Z5181 Encounter for therapeutic drug level monitoring: Secondary | ICD-10-CM

## 2015-07-23 LAB — POCT INR: INR: 4.8

## 2015-07-23 NOTE — Progress Notes (Signed)
Pre visit review using our clinic review tool, if applicable. No additional management support is needed unless otherwise documented below in the visit note. INR is high today.  This is a new patient and this is the 3rd visit.  Hold dosage for two days and then decreased dosage 15 percent.  Assured patient that I will get him regulated and this is why we check new coumadin patient's weekly for 4 weeks, then 1 X 2 weeks, 1 X 3 weeks and then 1 X 4 to 6 weeks.  Instructed patient to watch for and report any unusual bleeding.  Patient verbalized understanding.

## 2015-07-24 DIAGNOSIS — Z6829 Body mass index (BMI) 29.0-29.9, adult: Secondary | ICD-10-CM | POA: Diagnosis not present

## 2015-07-30 ENCOUNTER — Ambulatory Visit (INDEPENDENT_AMBULATORY_CARE_PROVIDER_SITE_OTHER): Payer: Medicare HMO | Admitting: General Practice

## 2015-07-30 DIAGNOSIS — Z5181 Encounter for therapeutic drug level monitoring: Secondary | ICD-10-CM | POA: Diagnosis not present

## 2015-07-30 LAB — POCT INR: INR: 1.8

## 2015-07-31 ENCOUNTER — Encounter: Payer: Self-pay | Admitting: Infectious Diseases

## 2015-07-31 ENCOUNTER — Ambulatory Visit (INDEPENDENT_AMBULATORY_CARE_PROVIDER_SITE_OTHER): Payer: Medicare HMO | Admitting: Infectious Diseases

## 2015-07-31 VITALS — BP 144/77 | HR 78 | Temp 98.0°F | Wt 217.0 lb

## 2015-07-31 DIAGNOSIS — T814XXA Infection following a procedure, initial encounter: Secondary | ICD-10-CM | POA: Diagnosis not present

## 2015-07-31 DIAGNOSIS — I82431 Acute embolism and thrombosis of right popliteal vein: Secondary | ICD-10-CM

## 2015-07-31 DIAGNOSIS — T8140XA Infection following a procedure, unspecified, initial encounter: Secondary | ICD-10-CM

## 2015-07-31 LAB — COMPREHENSIVE METABOLIC PANEL
ALK PHOS: 65 U/L (ref 40–115)
ALT: 12 U/L (ref 9–46)
AST: 14 U/L (ref 10–35)
Albumin: 3.7 g/dL (ref 3.6–5.1)
BILIRUBIN TOTAL: 0.7 mg/dL (ref 0.2–1.2)
BUN: 17 mg/dL (ref 7–25)
CO2: 27 mmol/L (ref 20–31)
CREATININE: 0.9 mg/dL (ref 0.70–1.18)
Calcium: 9.3 mg/dL (ref 8.6–10.3)
Chloride: 105 mmol/L (ref 98–110)
GLUCOSE: 81 mg/dL (ref 65–99)
Potassium: 4.5 mmol/L (ref 3.5–5.3)
Sodium: 140 mmol/L (ref 135–146)
Total Protein: 6.9 g/dL (ref 6.1–8.1)

## 2015-07-31 LAB — CBC
HEMATOCRIT: 39.6 % (ref 39.0–52.0)
HEMOGLOBIN: 12.9 g/dL — AB (ref 13.0–17.0)
MCH: 29.1 pg (ref 26.0–34.0)
MCHC: 32.6 g/dL (ref 30.0–36.0)
MCV: 89.4 fL (ref 78.0–100.0)
MPV: 9.8 fL (ref 8.6–12.4)
Platelets: 315 10*3/uL (ref 150–400)
RBC: 4.43 MIL/uL (ref 4.22–5.81)
RDW: 14.8 % (ref 11.5–15.5)
WBC: 9.1 10*3/uL (ref 4.0–10.5)

## 2015-07-31 LAB — C-REACTIVE PROTEIN: CRP: 1.7 mg/dL — AB (ref <0.60)

## 2015-07-31 NOTE — Assessment & Plan Note (Addendum)
He appears to be doing well.  Will check his ESR and CRP today.  Will plan that he continues his doxy for 1 month provided his ESR and CRP are normal.  Will defer to Dr Ronnald Ramp on need for repeat imaging, at this point I would hold off.  He wil lrtc prn

## 2015-07-31 NOTE — Assessment & Plan Note (Signed)
He will continue coumadin as directed by his PCP. His recent INR are noted in computer.

## 2015-07-31 NOTE — Progress Notes (Signed)
   Subjective:    Patient ID: Timothy Duncan, male    DOB: Jul 29, 1940, 75 y.o.   MRN: [id number]  HPI  75 yo M with hx of L4-5 laminectomy, microdiscectomy 05-03-15. He returns 12-7 with worsening back pain over the last 2 weeks. Re-adm on 12-7 and returned to OR on 12-7 after he was found to have MRI showing enhancing disc space and R ventral enhancing fluid collection.  His ESR was and CRP were elevated at his neurosurgeons office. Post-op he was started on vanco/ceftriaxone. His gram stains and Cx are (-).  He was d/c home on 12-9 He had difficulty with BLE (R>L)swelling and his IV anbx and PIC were d/c. He was found to have DVT in R calf, started on coumadin (07-06-15). He was changed to "green tablets". Doxycycline ID'ed via web.  States he has had no f/c.  Back feels "a little stiff", has neurosurgery f/u next week. Has not resumed exercise. Taking hydrocodone still for pain.   Review of Systems  Constitutional: Negative for fever and chills.  HENT: Negative for mouth sores.   Musculoskeletal: Positive for back pain.      Objective:   Physical Exam  Constitutional: He appears well-developed and well-nourished.  HENT:  Mouth/Throat: No oropharyngeal exudate.  Eyes: EOM are normal. Pupils are equal, round, and reactive to light.  Neck: Neck supple.  Cardiovascular: Normal rate, regular rhythm and normal heart sounds.   Pulmonary/Chest: Effort normal and breath sounds normal.  Abdominal: Soft. Bowel sounds are normal. There is no tenderness. There is no rebound.  Musculoskeletal:       Arms: Lymphadenopathy:    He has no cervical adenopathy.      Assessment & Plan:

## 2015-08-01 LAB — SEDIMENTATION RATE: Sed Rate: 18 mm/hr (ref 0–20)

## 2015-08-06 DIAGNOSIS — G8929 Other chronic pain: Secondary | ICD-10-CM | POA: Diagnosis not present

## 2015-08-06 DIAGNOSIS — M545 Low back pain: Secondary | ICD-10-CM | POA: Diagnosis not present

## 2015-08-13 ENCOUNTER — Ambulatory Visit (INDEPENDENT_AMBULATORY_CARE_PROVIDER_SITE_OTHER): Payer: Medicare HMO | Admitting: General Practice

## 2015-08-13 DIAGNOSIS — Z5181 Encounter for therapeutic drug level monitoring: Secondary | ICD-10-CM | POA: Diagnosis not present

## 2015-08-13 DIAGNOSIS — M545 Low back pain: Secondary | ICD-10-CM | POA: Diagnosis not present

## 2015-08-13 LAB — POCT INR: INR: 2.4

## 2015-08-13 NOTE — Progress Notes (Signed)
Pre visit review using our clinic review tool, if applicable. No additional management support is needed unless otherwise documented below in the visit note. 

## 2015-09-03 ENCOUNTER — Ambulatory Visit (INDEPENDENT_AMBULATORY_CARE_PROVIDER_SITE_OTHER): Payer: Medicare HMO | Admitting: General Practice

## 2015-09-03 ENCOUNTER — Telehealth: Payer: Self-pay | Admitting: Family Medicine

## 2015-09-03 DIAGNOSIS — Z5181 Encounter for therapeutic drug level monitoring: Secondary | ICD-10-CM

## 2015-09-03 LAB — POCT INR: INR: 2.4

## 2015-09-03 NOTE — Progress Notes (Signed)
I agree with this plan.

## 2015-09-03 NOTE — Progress Notes (Signed)
Pre visit review using our clinic review tool, if applicable. No additional management support is needed unless otherwise documented below in the visit note. 

## 2015-09-03 NOTE — Telephone Encounter (Signed)
Patient needs refill on losartan-hydrochlorothiazide (HYZAAR) 50-12.5 MG per tablet  [id number] [place], [place] (Phone) [phone] (Fax)

## 2015-09-04 MED ORDER — LOSARTAN POTASSIUM-HCTZ 50-12.5 MG PO TABS: ORAL_TABLET | ORAL | Status: DC

## 2015-09-11 DIAGNOSIS — M545 Low back pain: Secondary | ICD-10-CM | POA: Diagnosis not present

## 2015-09-13 ENCOUNTER — Encounter: Payer: Self-pay | Admitting: Infectious Diseases

## 2015-09-18 ENCOUNTER — Encounter: Payer: Self-pay | Admitting: Infectious Diseases

## 2015-10-01 ENCOUNTER — Ambulatory Visit (INDEPENDENT_AMBULATORY_CARE_PROVIDER_SITE_OTHER): Payer: Medicare HMO | Admitting: General Practice

## 2015-10-01 DIAGNOSIS — Z5181 Encounter for therapeutic drug level monitoring: Secondary | ICD-10-CM | POA: Diagnosis not present

## 2015-10-01 LAB — POCT INR: INR: 2.2

## 2015-10-01 NOTE — Progress Notes (Signed)
Pre visit review using our clinic review tool, if applicable. No additional management support is needed unless otherwise documented below in the visit note. 

## 2015-10-23 DIAGNOSIS — M545 Low back pain: Secondary | ICD-10-CM | POA: Diagnosis not present

## 2015-10-25 DIAGNOSIS — M6281 Muscle weakness (generalized): Secondary | ICD-10-CM | POA: Diagnosis not present

## 2015-10-25 DIAGNOSIS — M2560 Stiffness of unspecified joint, not elsewhere classified: Secondary | ICD-10-CM | POA: Diagnosis not present

## 2015-10-25 DIAGNOSIS — M545 Low back pain: Secondary | ICD-10-CM | POA: Diagnosis not present

## 2015-10-25 DIAGNOSIS — R2689 Other abnormalities of gait and mobility: Secondary | ICD-10-CM | POA: Diagnosis not present

## 2015-10-30 DIAGNOSIS — M545 Low back pain: Secondary | ICD-10-CM | POA: Diagnosis not present

## 2015-10-30 DIAGNOSIS — R2689 Other abnormalities of gait and mobility: Secondary | ICD-10-CM | POA: Diagnosis not present

## 2015-10-30 DIAGNOSIS — M2560 Stiffness of unspecified joint, not elsewhere classified: Secondary | ICD-10-CM | POA: Diagnosis not present

## 2015-10-30 DIAGNOSIS — M6281 Muscle weakness (generalized): Secondary | ICD-10-CM | POA: Diagnosis not present

## 2015-11-06 DIAGNOSIS — M6281 Muscle weakness (generalized): Secondary | ICD-10-CM | POA: Diagnosis not present

## 2015-11-06 DIAGNOSIS — M545 Low back pain: Secondary | ICD-10-CM | POA: Diagnosis not present

## 2015-11-06 DIAGNOSIS — M2560 Stiffness of unspecified joint, not elsewhere classified: Secondary | ICD-10-CM | POA: Diagnosis not present

## 2015-11-06 DIAGNOSIS — R2689 Other abnormalities of gait and mobility: Secondary | ICD-10-CM | POA: Diagnosis not present

## 2015-11-12 ENCOUNTER — Ambulatory Visit (INDEPENDENT_AMBULATORY_CARE_PROVIDER_SITE_OTHER): Payer: Medicare HMO | Admitting: General Practice

## 2015-11-12 DIAGNOSIS — Z5181 Encounter for therapeutic drug level monitoring: Secondary | ICD-10-CM | POA: Diagnosis not present

## 2015-11-12 LAB — POCT INR: INR: 1.9

## 2015-11-12 NOTE — Progress Notes (Signed)
Pre visit review using our clinic review tool, if applicable. No additional management support is needed unless otherwise documented below in the visit note. 

## 2015-11-12 NOTE — Progress Notes (Signed)
I agree with this plan.

## 2015-11-13 DIAGNOSIS — M6281 Muscle weakness (generalized): Secondary | ICD-10-CM | POA: Diagnosis not present

## 2015-11-13 DIAGNOSIS — M545 Low back pain: Secondary | ICD-10-CM | POA: Diagnosis not present

## 2015-11-13 DIAGNOSIS — M2560 Stiffness of unspecified joint, not elsewhere classified: Secondary | ICD-10-CM | POA: Diagnosis not present

## 2015-11-13 DIAGNOSIS — R2689 Other abnormalities of gait and mobility: Secondary | ICD-10-CM | POA: Diagnosis not present

## 2015-11-20 DIAGNOSIS — M545 Low back pain: Secondary | ICD-10-CM | POA: Diagnosis not present

## 2015-11-20 DIAGNOSIS — R2689 Other abnormalities of gait and mobility: Secondary | ICD-10-CM | POA: Diagnosis not present

## 2015-11-20 DIAGNOSIS — M2560 Stiffness of unspecified joint, not elsewhere classified: Secondary | ICD-10-CM | POA: Diagnosis not present

## 2015-11-20 DIAGNOSIS — M6281 Muscle weakness (generalized): Secondary | ICD-10-CM | POA: Diagnosis not present

## 2015-12-17 DIAGNOSIS — H524 Presbyopia: Secondary | ICD-10-CM | POA: Diagnosis not present

## 2015-12-17 DIAGNOSIS — H5213 Myopia, bilateral: Secondary | ICD-10-CM | POA: Diagnosis not present

## 2015-12-17 DIAGNOSIS — Z961 Presence of intraocular lens: Secondary | ICD-10-CM | POA: Diagnosis not present

## 2015-12-24 ENCOUNTER — Telehealth: Payer: Self-pay | Admitting: Family Medicine

## 2015-12-24 ENCOUNTER — Ambulatory Visit (INDEPENDENT_AMBULATORY_CARE_PROVIDER_SITE_OTHER): Payer: Medicare HMO | Admitting: General Practice

## 2015-12-24 DIAGNOSIS — Z5181 Encounter for therapeutic drug level monitoring: Secondary | ICD-10-CM | POA: Diagnosis not present

## 2015-12-24 LAB — POCT INR: INR: 1.8

## 2015-12-24 NOTE — Telephone Encounter (Signed)
Pt would like to be work in for cpx in [employer]. Can I create 30 min slot?

## 2015-12-24 NOTE — Progress Notes (Signed)
Pre visit review using our clinic review tool, if applicable. No additional management support is needed unless otherwise documented below in the visit note. 

## 2015-12-25 NOTE — Telephone Encounter (Signed)
Timothy Duncan, okay to work pt in for CPX, just so there is not too many in a row.

## 2015-12-25 NOTE — Telephone Encounter (Signed)
Please advise 

## 2015-12-27 NOTE — Telephone Encounter (Signed)
Pt has been sch

## 2016-02-04 ENCOUNTER — Ambulatory Visit (INDEPENDENT_AMBULATORY_CARE_PROVIDER_SITE_OTHER): Payer: Medicare HMO | Admitting: General Practice

## 2016-02-04 DIAGNOSIS — Z5181 Encounter for therapeutic drug level monitoring: Secondary | ICD-10-CM

## 2016-02-04 LAB — POCT INR: INR: 2.4

## 2016-02-04 NOTE — Progress Notes (Signed)
I agree with this plan.

## 2016-03-05 ENCOUNTER — Ambulatory Visit: Payer: Self-pay | Admitting: Internal Medicine

## 2016-03-17 ENCOUNTER — Ambulatory Visit (INDEPENDENT_AMBULATORY_CARE_PROVIDER_SITE_OTHER): Payer: Medicare HMO | Admitting: General Practice

## 2016-03-17 DIAGNOSIS — Z5181 Encounter for therapeutic drug level monitoring: Secondary | ICD-10-CM

## 2016-03-17 LAB — POCT INR: INR: 2.8

## 2016-03-19 ENCOUNTER — Encounter: Payer: Self-pay | Admitting: Family Medicine

## 2016-03-19 ENCOUNTER — Ambulatory Visit (INDEPENDENT_AMBULATORY_CARE_PROVIDER_SITE_OTHER): Payer: Medicare HMO | Admitting: Family Medicine

## 2016-03-19 VITALS — BP 150/78 | HR 75 | Temp 97.7°F | Ht 68.0 in

## 2016-03-19 DIAGNOSIS — I10 Essential (primary) hypertension: Secondary | ICD-10-CM | POA: Diagnosis not present

## 2016-03-19 DIAGNOSIS — Z0000 Encounter for general adult medical examination without abnormal findings: Secondary | ICD-10-CM | POA: Diagnosis not present

## 2016-03-19 LAB — CBC WITH DIFFERENTIAL/PLATELET
BASOS ABS: 0.1 10*3/uL (ref 0.0–0.1)
Basophils Relative: 0.6 % (ref 0.0–3.0)
EOS ABS: 0.2 10*3/uL (ref 0.0–0.7)
EOS PCT: 1.6 % (ref 0.0–5.0)
HCT: 46.6 % (ref 39.0–52.0)
Hemoglobin: 16 g/dL (ref 13.0–17.0)
LYMPHS ABS: 2.4 10*3/uL (ref 0.7–4.0)
Lymphocytes Relative: 24.9 % (ref 12.0–46.0)
MCHC: 34.3 g/dL (ref 30.0–36.0)
MCV: 90.2 fl (ref 78.0–100.0)
MONO ABS: 0.7 10*3/uL (ref 0.1–1.0)
Monocytes Relative: 7.1 % (ref 3.0–12.0)
NEUTROS PCT: 65.8 % (ref 43.0–77.0)
Neutro Abs: 6.3 10*3/uL (ref 1.4–7.7)
Platelets: 245 10*3/uL (ref 150.0–400.0)
RBC: 5.16 Mil/uL (ref 4.22–5.81)
RDW: 14.5 % (ref 11.5–15.5)
WBC: 9.6 10*3/uL (ref 4.0–10.5)

## 2016-03-19 LAB — LIPID PANEL
CHOL/HDL RATIO: 3
Cholesterol: 148 mg/dL (ref 0–200)
HDL: 52.3 mg/dL (ref >39.00)
LDL CALC: 80 mg/dL (ref 0–99)
NONHDL: 95.98
Triglycerides: 80 mg/dL (ref 0.0–149.0)
VLDL: 16 mg/dL (ref 0.0–40.0)

## 2016-03-19 LAB — HEPATIC FUNCTION PANEL
ALK PHOS: 61 U/L (ref 39–117)
ALT: 23 U/L (ref 0–53)
AST: 20 U/L (ref 0–37)
Albumin: 4.2 g/dL (ref 3.5–5.2)
BILIRUBIN TOTAL: 1 mg/dL (ref 0.2–1.2)
Bilirubin, Direct: 0.2 mg/dL (ref 0.0–0.3)
Total Protein: 7 g/dL (ref 6.0–8.3)

## 2016-03-19 LAB — POCT URINALYSIS DIPSTICK
Bilirubin, UA: NEGATIVE
Glucose, UA: NEGATIVE
KETONES UA: NEGATIVE
LEUKOCYTES UA: NEGATIVE
Nitrite, UA: NEGATIVE
PH UA: 5.5
PROTEIN UA: NEGATIVE
Spec Grav, UA: 1.015
Urobilinogen, UA: 0.2

## 2016-03-19 LAB — BASIC METABOLIC PANEL
BUN: 14 mg/dL (ref 6–23)
CO2: 27 mEq/L (ref 19–32)
CREATININE: 0.98 mg/dL (ref 0.40–1.50)
Calcium: 9.1 mg/dL (ref 8.4–10.5)
Chloride: 108 mEq/L (ref 96–112)
GFR: 79.26 mL/min (ref >60.00)
Glucose, Bld: 90 mg/dL (ref 70–99)
POTASSIUM: 4.2 meq/L (ref 3.5–5.1)
Sodium: 142 mEq/L (ref 135–145)

## 2016-03-19 LAB — PSA: PSA: 1.52 ng/mL (ref 0.10–4.00)

## 2016-03-19 LAB — TSH: TSH: 2.31 u[IU]/mL (ref 0.35–4.50)

## 2016-03-19 MED ORDER — SILDENAFIL CITRATE 20 MG PO TABS: 20.0000 mg | ORAL_TABLET | Freq: Every evening | ORAL | 11 refills | Status: DC | PRN

## 2016-03-19 MED ORDER — LOSARTAN POTASSIUM-HCTZ 50-12.5 MG PO TABS: ORAL_TABLET | ORAL | 3 refills | Status: DC

## 2016-03-19 NOTE — Progress Notes (Signed)
Pre visit review using our clinic review tool, if applicable. No additional management support is needed unless otherwise documented below in the visit note. 

## 2016-03-19 NOTE — Patient Instructions (Signed)
Purchase a Omron pump up digital pressure cuff......Marland Kitchen [place]........ check your blood pressure daily in the morning for 2 weeks to be sure your blood pressure is at goal.........Marland Kitchen goal is 140/90 or less  If not at goal call and leave me a message at extension [phone]  If it is at goal........ then just check your blood pressure once weekly going forward  Stop the Coumadin  After you been off the Coumadin a week restart an aspirin tablet daily  Walk 30 minutes daily............ carbohydrate free diet......Marland Kitchen goal to lose 12 pounds in the next 12 months

## 2016-03-19 NOTE — Progress Notes (Signed)
Timothy Duncan is a 75 year old divorce male nonsmoker who comes in today for general physical examination because of a history of hypertension  He's on Hyzaar 50-12.5 one half tab daily. BP initially 150/78. We will let him rest and do a follow-up blood pressure. He does not check his blood pressure at home.  He's been on Coumadin now for a year. He had a pulmonary embolus following lumbar disc surgery. He had surgery and then epidural hematoma. After the second surgery he developed a pulmonary embolus. He's been on Coumadin for a year. It's time to stop  He gets routine eye care............ has had bilateral cataract extraction and lens implants by Dr. Gershon Crane...Marland KitchenMarland KitchenMarland Kitchen regular dental care, colonoscopy 3 years ago and GI.  Vaccinations,,,,,,,,, tetanus booster 2010 he's going to get a flu shot somewhere else.  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  Review of systems weight is up to 239. After surgery got down to 217. Advised to go on a high fructose corn syrup free diet and walk 30 minutes daily. He says he likes his cookies  Physical examination vital signs stable he is afebrile except for elevated blood pressure initially. BP now 140/80.  Examination HEENT were negative except for evidence of bilateral cataract extraction and lens implants. Neck was supple no adenopathy thyroid normal no carotid bruits cardiopulmonary exam normal abdominal exam normal no bruits peripheral pulses normal skin normal except for numerous freckles moles and seborrheic keratosis. Neurologic exam normal except for some tingling his right foot which is a residual from his lumbar disc surgery. No foot drop gait normal Impression  #1 hypertension at goal........ continue current therapy..... Monitor blood pressure at home be sure it stays normal  #2 history of PE post  surgery one year ago..... Discontinue Coumadin......Marland Kitchen restart aspirin daily  #3 status post bilateral cataract and lens implants......... followed by ophthalmology  #4 overweight........ stressed diet exercise and weight loss

## 2016-03-20 ENCOUNTER — Telehealth: Payer: Self-pay

## 2016-03-20 NOTE — Telephone Encounter (Signed)
[facility] . PA submitted & is pending. Key: Christella Hartigan

## 2016-03-21 NOTE — Telephone Encounter (Signed)
PA denied because rules do not allow coverage of drugs being used for erectile and sexual dysfunction or decreased libido.

## 2016-03-25 NOTE — Telephone Encounter (Signed)
Per Dr. Sherren Mocha no PA is necessary finding, an alternative medication is a better solution.

## 2016-03-26 ENCOUNTER — Telehealth: Payer: Self-pay | Admitting: *Deleted

## 2016-03-26 NOTE — Telephone Encounter (Signed)
Called and informed pt of lab results. Pt verbalized understanding.

## 2016-03-26 NOTE — Telephone Encounter (Signed)
-----   Message from Dorena Cookey, MD sent at 03/24/2016 10:54 AM EDT ----- Please call labs normal

## 2016-03-30 ENCOUNTER — Encounter (HOSPITAL_COMMUNITY): Payer: Self-pay

## 2016-03-30 ENCOUNTER — Emergency Department (HOSPITAL_COMMUNITY): Payer: Medicare HMO

## 2016-03-30 ENCOUNTER — Inpatient Hospital Stay (HOSPITAL_COMMUNITY)
Admission: EM | Admit: 2016-03-30 | Discharge: 2016-04-01 | DRG: 064 | Disposition: A | Discharge status: Home / Self Care | Payer: Medicare HMO | Attending: Internal Medicine | Admitting: Internal Medicine

## 2016-03-30 DIAGNOSIS — I639 Cerebral infarction, unspecified: Principal | ICD-10-CM | POA: Diagnosis present

## 2016-03-30 DIAGNOSIS — Z8249 Family history of ischemic heart disease and other diseases of the circulatory system: Secondary | ICD-10-CM

## 2016-03-30 DIAGNOSIS — R29898 Other symptoms and signs involving the musculoskeletal system: Secondary | ICD-10-CM | POA: Diagnosis not present

## 2016-03-30 DIAGNOSIS — I10 Essential (primary) hypertension: Secondary | ICD-10-CM | POA: Diagnosis present

## 2016-03-30 DIAGNOSIS — Z86718 Personal history of other venous thrombosis and embolism: Secondary | ICD-10-CM | POA: Diagnosis not present

## 2016-03-30 DIAGNOSIS — N179 Acute kidney failure, unspecified: Secondary | ICD-10-CM | POA: Diagnosis present

## 2016-03-30 DIAGNOSIS — Z8673 Personal history of transient ischemic attack (TIA), and cerebral infarction without residual deficits: Secondary | ICD-10-CM | POA: Diagnosis present

## 2016-03-30 DIAGNOSIS — Z6833 Body mass index (BMI) 33.0-33.9, adult: Secondary | ICD-10-CM

## 2016-03-30 DIAGNOSIS — Z7982 Long term (current) use of aspirin: Secondary | ICD-10-CM

## 2016-03-30 DIAGNOSIS — F1020 Alcohol dependence, uncomplicated: Secondary | ICD-10-CM

## 2016-03-30 DIAGNOSIS — R410 Disorientation, unspecified: Secondary | ICD-10-CM | POA: Diagnosis not present

## 2016-03-30 DIAGNOSIS — Z9889 Other specified postprocedural states: Secondary | ICD-10-CM

## 2016-03-30 DIAGNOSIS — Z86711 Personal history of pulmonary embolism: Secondary | ICD-10-CM

## 2016-03-30 DIAGNOSIS — M6281 Muscle weakness (generalized): Secondary | ICD-10-CM | POA: Diagnosis not present

## 2016-03-30 DIAGNOSIS — Z87891 Personal history of nicotine dependence: Secondary | ICD-10-CM

## 2016-03-30 DIAGNOSIS — E669 Obesity, unspecified: Secondary | ICD-10-CM | POA: Diagnosis present

## 2016-03-30 DIAGNOSIS — G9341 Metabolic encephalopathy: Secondary | ICD-10-CM | POA: Diagnosis present

## 2016-03-30 DIAGNOSIS — Z9849 Cataract extraction status, unspecified eye: Secondary | ICD-10-CM

## 2016-03-30 DIAGNOSIS — Z79899 Other long term (current) drug therapy: Secondary | ICD-10-CM

## 2016-03-30 HISTORY — DX: Acute embolism and thrombosis of unspecified deep veins of unspecified lower extremity: I82.409

## 2016-03-30 LAB — URINALYSIS, ROUTINE W REFLEX MICROSCOPIC
BILIRUBIN URINE: NEGATIVE
GLUCOSE, UA: NEGATIVE mg/dL
KETONES UR: 15 mg/dL — AB
Leukocytes, UA: NEGATIVE
Nitrite: NEGATIVE
PROTEIN: NEGATIVE mg/dL
Specific Gravity, Urine: 1.02 (ref 1.005–1.030)
pH: 6 (ref 5.0–8.0)

## 2016-03-30 LAB — DIFFERENTIAL
BASOS ABS: 0 10*3/uL (ref 0.0–0.1)
BASOS PCT: 0 %
EOS ABS: 0 10*3/uL (ref 0.0–0.7)
EOS PCT: 0 %
LYMPHS ABS: 1.9 10*3/uL (ref 0.7–4.0)
Lymphocytes Relative: 10 %
Monocytes Absolute: 1.3 10*3/uL — ABNORMAL HIGH (ref 0.1–1.0)
Monocytes Relative: 7 %
NEUTROS PCT: 83 %
Neutro Abs: 15.7 10*3/uL — ABNORMAL HIGH (ref 1.7–7.7)

## 2016-03-30 LAB — I-STAT CHEM 8, ED
BUN: 22 mg/dL — ABNORMAL HIGH (ref 6–20)
CALCIUM ION: 1.1 mmol/L — AB (ref 1.15–1.40)
Chloride: 111 mmol/L (ref 101–111)
Creatinine, Ser: 1.6 mg/dL — ABNORMAL HIGH (ref 0.61–1.24)
GLUCOSE: 100 mg/dL — AB (ref 65–99)
HCT: 52 % (ref 39.0–52.0)
HEMOGLOBIN: 17.7 g/dL — AB (ref 13.0–17.0)
Potassium: 4.1 mmol/L (ref 3.5–5.1)
SODIUM: 145 mmol/L (ref 135–145)
TCO2: 20 mmol/L (ref 0–100)

## 2016-03-30 LAB — URINE MICROSCOPIC-ADD ON

## 2016-03-30 LAB — CBC
HCT: 50.8 % (ref 39.0–52.0)
HEMOGLOBIN: 17.1 g/dL — AB (ref 13.0–17.0)
MCH: 31.3 pg (ref 26.0–34.0)
MCHC: 33.7 g/dL (ref 30.0–36.0)
MCV: 92.9 fL (ref 78.0–100.0)
PLATELETS: 251 10*3/uL (ref 150–400)
RBC: 5.47 MIL/uL (ref 4.22–5.81)
RDW: 14.3 % (ref 11.5–15.5)
WBC: 18.9 10*3/uL — ABNORMAL HIGH (ref 4.0–10.5)

## 2016-03-30 LAB — APTT: APTT: 31 s (ref 24–36)

## 2016-03-30 LAB — COMPREHENSIVE METABOLIC PANEL
ALBUMIN: 4.7 g/dL (ref 3.5–5.0)
ALT: 24 U/L (ref 17–63)
ANION GAP: 15 (ref 5–15)
AST: 30 U/L (ref 15–41)
Alkaline Phosphatase: 64 U/L (ref 38–126)
BILIRUBIN TOTAL: 3.3 mg/dL — AB (ref 0.3–1.2)
BUN: 19 mg/dL (ref 6–20)
CHLORIDE: 111 mmol/L (ref 101–111)
CO2: 18 mmol/L — ABNORMAL LOW (ref 22–32)
Calcium: 9.8 mg/dL (ref 8.9–10.3)
Creatinine, Ser: 1.75 mg/dL — ABNORMAL HIGH (ref 0.61–1.24)
GFR calc Af Amer: 42 mL/min — ABNORMAL LOW (ref >60)
GFR calc non Af Amer: 36 mL/min — ABNORMAL LOW (ref >60)
GLUCOSE: 98 mg/dL (ref 65–99)
POTASSIUM: 4.3 mmol/L (ref 3.5–5.1)
Sodium: 144 mmol/L (ref 135–145)
TOTAL PROTEIN: 7.9 g/dL (ref 6.5–8.1)

## 2016-03-30 LAB — CBG MONITORING, ED: GLUCOSE-CAPILLARY: 107 mg/dL — AB (ref 65–99)

## 2016-03-30 LAB — ETHANOL: Alcohol, Ethyl (B): <5 mg/dL (ref <5)

## 2016-03-30 LAB — PROTIME-INR
INR: 1.02
PROTHROMBIN TIME: 13.4 s (ref 11.4–15.2)

## 2016-03-30 LAB — CK: CK TOTAL: 1021 U/L — AB (ref 49–397)

## 2016-03-30 LAB — I-STAT TROPONIN, ED: TROPONIN I, POC: 0.04 ng/mL (ref 0.00–0.08)

## 2016-03-30 MED ORDER — SODIUM CHLORIDE 0.9 % IV BOLUS (SEPSIS)
500.0000 mL | Freq: Once | INTRAVENOUS | Status: AC
  Administered 2016-03-30: 500 mL via INTRAVENOUS

## 2016-03-30 NOTE — ED Provider Notes (Signed)
Emergency Department Provider Note   I have reviewed the triage vital signs and the nursing notes.   HISTORY  Chief Complaint Altered Mental Status   HPI Timothy Duncan is a 75 y.o. male with past medical history of hypertension presents to the emergency department for evaluation of confusion and fall at home. Family at bedside state that the been trying to reach their father all day. They called him at 1 PM and were not able to get in touch so they presented to the house. They found him laying on the floor in the kitchen. The patient states that he tripped and fell and was on the ground for approximately 30 minutes. Family states they suspect he was down for much longer and seemed acutely confused. He was describing seeing other family members in the house during the day who were not there. No fever or chills. No medication changes. The patient has a history of alcohol abuse but the son reports he has not had a drink in 2 years. They looked around the house but could not find evidence of alcohol use.   Patient states his only complaint right now is muscular skeletal pain in his lower back and leg.     Past Medical History:  Diagnosis Date  . ED (erectile dysfunction)   . Hypertension     Patient Active Problem List   Diagnosis Date Noted  . Post op infection 07/31/2015  . Encounter for therapeutic drug monitoring 07/13/2015  . DVT (deep venous thrombosis) ([place]) 07/11/2015  . S/P lumbar laminectomy 05/03/2015  . Actinic keratosis 02/04/2011  . BACK PAIN 09/03/2009  . ERECTILE DYSFUNCTION 03/24/2008  . Essential hypertension 03/24/2008  . FATIGUE 03/24/2008    Past Surgical History:  Procedure Laterality Date  . APPENDECTOMY    . CATARACT EXTRACTION    . COLONOSCOPY    . LUMBAR LAMINECTOMY/DECOMPRESSION MICRODISCECTOMY Right 05/03/2015   Procedure: Microdiscectomy - Lumbar four-Lumbar five - right ;  Surgeon: Eustace Moore, MD;  Location: [facility];  Service:  Neurosurgery;  Laterality: Right;  . TONSILLECTOMY    . WOUND EXPLORATION N/A 06/06/2015   Procedure: Presumed discitis, Lumbar exploration,Repeat discectomy Lumbar four-five with deep tissue culture;  Surgeon: Eustace Moore, MD;  Location: [facility];  Service: Neurosurgery;  Laterality: N/A;    Current Outpatient Rx  . Order #: [id number] Class: Normal  . Order #: [id number] Class: Print    Allergies Review of patient's allergies indicates no known allergies.  History reviewed. No pertinent family history.  Social History Social History  Substance Use Topics  . Smoking status: Former Research scientist (life sciences)  . Smokeless tobacco: Never Used  . Alcohol use No    Review of Systems  Constitutional: No fever/chills Eyes: No visual changes. ENT: No sore throat. Cardiovascular: Denies chest pain. Respiratory: Denies shortness of breath. Gastrointestinal: No abdominal pain.  No nausea, no vomiting.  No diarrhea.  No constipation. Genitourinary: Negative for dysuria. Musculoskeletal: Positive for back pain and right leg pain.  Skin: Negative for rash. Neurological: Negative for headaches, focal weakness or numbness.  10-point ROS otherwise negative.  ____________________________________________   PHYSICAL EXAM:  VITAL SIGNS: ED Triage Vitals  Enc Vitals Group     BP 03/30/16 2122 125/89     Pulse Rate 03/30/16 2122 108     Resp 03/30/16 2122 18     Temp 03/30/16 2122 98.7 F (37.1 C)     Temp Source 03/30/16 2122 Oral     SpO2 03/30/16  2122 94 %     Weight 03/30/16 2123 230 lb (104.3 kg)     Height 03/30/16 2123 5\' 10"  (1.778 m)     Pain Score 03/30/16 2152 0   Constitutional: Alert and oriented. Well appearing and in no acute distress. Eyes: Conjunctivae are normal. PERRL. EOMI. Head: Atraumatic Nose: No congestion/rhinnorhea. Mouth/Throat: Mucous membranes are moist.  Oropharynx non-erythematous. Neck: No stridor. No cervical spine tenderness to palpation. Cardiovascular:  Normal rate, regular rhythm. Good peripheral circulation. Grossly normal heart sounds.   Respiratory: Normal respiratory effort.  No retractions. Lungs CTAB. Gastrointestinal: Soft and nontender. No distention.  Musculoskeletal: No lower extremity tenderness nor edema. No gross deformities of extremities. Neurologic:  Normal speech and language. No gross focal neurologic deficits are appreciated. No CN deficits 2-12. No pronator drift.  Skin:  Skin is warm, dry and intact. No rash noted.  ____________________________________________   LABS (all labs ordered are listed, but only abnormal results are displayed)  Labs Reviewed  CBC - Abnormal; Notable for the following:       Result Value   WBC 18.9 (*)    Hemoglobin 17.1 (*)    All other components within normal limits  DIFFERENTIAL - Abnormal; Notable for the following:    Neutro Abs 15.7 (*)    Monocytes Absolute 1.3 (*)    All other components within normal limits  CBG MONITORING, ED - Abnormal; Notable for the following:    Glucose-Capillary 107 (*)    All other components within normal limits  I-STAT CHEM 8, ED - Abnormal; Notable for the following:    BUN 22 (*)    Creatinine, Ser 1.60 (*)    Glucose, Bld 100 (*)    Calcium, Ion 1.10 (*)    Hemoglobin 17.7 (*)    All other components within normal limits  PROTIME-INR  APTT  COMPREHENSIVE METABOLIC PANEL  CK  URINALYSIS, ROUTINE W REFLEX MICROSCOPIC (NOT AT [facility])  ETHANOL  I-STAT TROPOININ, ED   ____________________________________________  EKG   EKG Interpretation  Date/Time:  Sunday March 30 2016 21:46:22 EDT Ventricular Rate:  101 PR Interval:    QRS Duration: 159 QT Interval:  389 QTC Calculation: 505 R Axis:   -57 Text Interpretation:  Sinus tachycardia Left bundle branch block No STEMI. Similar to prior.  Confirmed by Lilyannah Zuelke MD, Indie Nickerson [phone]) on 03/30/2016 9:49:00 PM       ____________________________________________  RADIOLOGY  CT head and CXR  reviewed.  ____________________________________________   PROCEDURES  Procedure(s) performed:   Procedures  None ____________________________________________   INITIAL IMPRESSION / ASSESSMENT AND PLAN / ED COURSE  Pertinent labs & imaging results that were available during my care of the patient were reviewed by me and considered in my medical decision making (see chart for details).  Patient resents to the emergency department for evaluation of altered mental status and being found down at home. The patient is awake and alert in providing a detailed history but family, outside of the room, reports that very little of what he is saying is true. The patient has also been having apparent auditory hallucinations. No focal neurological deficit. No evidence of head trauma. She does have some acute kidney injury likely from dehydration. Will send CK and EtOH in addition to CVA labs.   CT head and CXR are unremarkable. Patient with AKI and mild CK elevation. Unclear cause of patient symptoms. Possible CVA but no obvious focal neurological findings. No evidence of infection. Updated patient and family at bedside.  Discussed patient's case with hospitalist, Dr. Eulas Post.  Recommend admission to obs, telemetry bed.  I will place holding orders per their request. Patient and family (if present) updated with plan. Care transferred to hospitalist service.  I reviewed all nursing notes, vitals, pertinent old records, EKGs, labs, imaging (as available).  ____________________________________________  FINAL CLINICAL IMPRESSION(S) / ED DIAGNOSES  Final diagnoses:  Delirium  Right leg weakness  Right leg weakness  Stroke ([place])  Stroke [facility])     MEDICATIONS GIVEN DURING THIS VISIT:  Medications  0.9 %  sodium chloride infusion (100 mL/hr Intravenous Transfusing/Transfer [id number])  folic acid (FOLVITE) tablet 1 mg (1 mg Oral Given 03/31/16 1247)  amLODipine (NORVASC) tablet 5 mg (5 mg Oral  Given 03/31/16 1248)  hydrALAZINE (APRESOLINE) injection 20 mg (not administered)  aspirin EC tablet 81 mg (81 mg Oral Given 03/31/16 1248)  atorvastatin (LIPITOR) tablet 40 mg (not administered)  LORazepam (ATIVAN) tablet 1 mg (not administered)    Or  LORazepam (ATIVAN) injection 1 mg (not administered)  multivitamin with minerals tablet 1 tablet (1 tablet Oral Given 03/31/16 1247)  thiamine (VITAMIN B-1) tablet 100 mg (100 mg Oral Given 03/31/16 1354)  sodium chloride 0.9 % bolus 500 mL (0 mLs Intravenous Stopped 03/30/16 2236)  gadobenate dimeglumine (MULTIHANCE) injection 20 mL (20 mLs Intravenous Contrast Given 03/31/16 1210)      Note:  This document was prepared using Dragon voice recognition software and may include unintentional dictation errors.  Nanda Quinton, MD Emergency Medicine  Margette Fast, MD 03/31/16 [phone]

## 2016-03-30 NOTE — ED Triage Notes (Signed)
Pt here accompanied by son and DIL.  Son reports pt talked to his brother that lives in [place] last night at [place] and per brother pt sounded normal.  DIL and son report they have tried to call him since 1pm today.  Onset 1 hour PTA son and DIL found pt laying on floor with phone in hand and bed did not look like it had been slept in.  A&Ox4 but is slow to answer some questions.  No slurred speech.  Per pt he went to bed last night and when he got up this morning he seem confused and kept falling.  Does not remember talking to his brother last night and reports he has not ate or drank anything today.  Keeps asking for something to drink.

## 2016-03-31 ENCOUNTER — Observation Stay (HOSPITAL_COMMUNITY): Payer: Medicare HMO

## 2016-03-31 ENCOUNTER — Encounter (HOSPITAL_COMMUNITY): Payer: Self-pay | Admitting: General Practice

## 2016-03-31 DIAGNOSIS — I6789 Other cerebrovascular disease: Secondary | ICD-10-CM | POA: Diagnosis not present

## 2016-03-31 DIAGNOSIS — Z8249 Family history of ischemic heart disease and other diseases of the circulatory system: Secondary | ICD-10-CM | POA: Diagnosis not present

## 2016-03-31 DIAGNOSIS — M47816 Spondylosis without myelopathy or radiculopathy, lumbar region: Secondary | ICD-10-CM | POA: Diagnosis not present

## 2016-03-31 DIAGNOSIS — Z8673 Personal history of transient ischemic attack (TIA), and cerebral infarction without residual deficits: Secondary | ICD-10-CM | POA: Diagnosis present

## 2016-03-31 DIAGNOSIS — R29898 Other symptoms and signs involving the musculoskeletal system: Secondary | ICD-10-CM | POA: Diagnosis not present

## 2016-03-31 DIAGNOSIS — R410 Disorientation, unspecified: Secondary | ICD-10-CM

## 2016-03-31 DIAGNOSIS — Z7982 Long term (current) use of aspirin: Secondary | ICD-10-CM | POA: Diagnosis not present

## 2016-03-31 DIAGNOSIS — Z6833 Body mass index (BMI) 33.0-33.9, adult: Secondary | ICD-10-CM | POA: Diagnosis not present

## 2016-03-31 DIAGNOSIS — I639 Cerebral infarction, unspecified: Secondary | ICD-10-CM | POA: Diagnosis not present

## 2016-03-31 DIAGNOSIS — Z86711 Personal history of pulmonary embolism: Secondary | ICD-10-CM | POA: Diagnosis not present

## 2016-03-31 DIAGNOSIS — N179 Acute kidney failure, unspecified: Secondary | ICD-10-CM | POA: Diagnosis not present

## 2016-03-31 DIAGNOSIS — Z87891 Personal history of nicotine dependence: Secondary | ICD-10-CM | POA: Diagnosis not present

## 2016-03-31 DIAGNOSIS — Z86718 Personal history of other venous thrombosis and embolism: Secondary | ICD-10-CM | POA: Diagnosis not present

## 2016-03-31 DIAGNOSIS — E669 Obesity, unspecified: Secondary | ICD-10-CM | POA: Diagnosis present

## 2016-03-31 DIAGNOSIS — F1020 Alcohol dependence, uncomplicated: Secondary | ICD-10-CM

## 2016-03-31 DIAGNOSIS — Z9849 Cataract extraction status, unspecified eye: Secondary | ICD-10-CM | POA: Diagnosis not present

## 2016-03-31 DIAGNOSIS — Z9889 Other specified postprocedural states: Secondary | ICD-10-CM | POA: Diagnosis not present

## 2016-03-31 DIAGNOSIS — R69 Illness, unspecified: Secondary | ICD-10-CM | POA: Diagnosis not present

## 2016-03-31 DIAGNOSIS — I10 Essential (primary) hypertension: Secondary | ICD-10-CM | POA: Diagnosis not present

## 2016-03-31 DIAGNOSIS — I63212 Cerebral infarction due to unspecified occlusion or stenosis of left vertebral arteries: Secondary | ICD-10-CM | POA: Diagnosis not present

## 2016-03-31 DIAGNOSIS — Z79899 Other long term (current) drug therapy: Secondary | ICD-10-CM | POA: Diagnosis not present

## 2016-03-31 DIAGNOSIS — G9341 Metabolic encephalopathy: Secondary | ICD-10-CM | POA: Diagnosis not present

## 2016-03-31 LAB — LIPID PANEL
CHOL/HDL RATIO: 3.2 ratio
Cholesterol: 164 mg/dL (ref 0–200)
HDL: 52 mg/dL (ref >40)
LDL Cholesterol: 96 mg/dL (ref 0–99)
TRIGLYCERIDES: 78 mg/dL (ref <150)
VLDL: 16 mg/dL (ref 0–40)

## 2016-03-31 LAB — COMPREHENSIVE METABOLIC PANEL
ALBUMIN: 4.4 g/dL (ref 3.5–5.0)
ALK PHOS: 58 U/L (ref 38–126)
ALT: 24 U/L (ref 17–63)
AST: 37 U/L (ref 15–41)
Anion gap: 8 (ref 5–15)
BILIRUBIN TOTAL: 3.7 mg/dL — AB (ref 0.3–1.2)
BUN: 17 mg/dL (ref 6–20)
CALCIUM: 9.3 mg/dL (ref 8.9–10.3)
CO2: 22 mmol/L (ref 22–32)
CREATININE: 1.62 mg/dL — AB (ref 0.61–1.24)
Chloride: 113 mmol/L — ABNORMAL HIGH (ref 101–111)
GFR calc Af Amer: 46 mL/min — ABNORMAL LOW (ref >60)
GFR calc non Af Amer: 40 mL/min — ABNORMAL LOW (ref >60)
GLUCOSE: 110 mg/dL — AB (ref 65–99)
Potassium: 3.6 mmol/L (ref 3.5–5.1)
SODIUM: 143 mmol/L (ref 135–145)
TOTAL PROTEIN: 7.4 g/dL (ref 6.5–8.1)

## 2016-03-31 LAB — CK: Total CK: 1774 U/L — ABNORMAL HIGH (ref 49–397)

## 2016-03-31 LAB — RAPID URINE DRUG SCREEN, HOSP PERFORMED
AMPHETAMINES: NOT DETECTED
BARBITURATES: NOT DETECTED
Benzodiazepines: NOT DETECTED
Cocaine: NOT DETECTED
Opiates: NOT DETECTED
TETRAHYDROCANNABINOL: NOT DETECTED

## 2016-03-31 LAB — TROPONIN I
TROPONIN I: 0.05 ng/mL — AB (ref <0.03)
Troponin I: 0.06 ng/mL (ref <0.03)
Troponin I: 0.05 ng/mL (ref <0.03)

## 2016-03-31 LAB — TSH: TSH: 2.211 u[IU]/mL (ref 0.350–4.500)

## 2016-03-31 MED ORDER — GADOBENATE DIMEGLUMINE 529 MG/ML IV SOLN
20.0000 mL | Freq: Once | INTRAVENOUS | Status: AC
  Administered 2016-03-31: 20 mL via INTRAVENOUS

## 2016-03-31 MED ORDER — FOLIC ACID 1 MG PO TABS
1.0000 mg | ORAL_TABLET | Freq: Every day | ORAL | Status: DC
  Administered 2016-03-31 – 2016-04-01 (×2): 1 mg via ORAL
  Filled 2016-03-31 (×2): qty 1

## 2016-03-31 MED ORDER — LORAZEPAM 1 MG PO TABS: 1.0000 mg | ORAL_TABLET | Freq: Four times a day (QID) | ORAL | Status: DC | PRN

## 2016-03-31 MED ORDER — ASPIRIN EC 81 MG PO TBEC
81.0000 mg | DELAYED_RELEASE_TABLET | Freq: Every day | ORAL | Status: DC
  Administered 2016-03-31 – 2016-04-01 (×2): 81 mg via ORAL
  Filled 2016-03-31 (×2): qty 1

## 2016-03-31 MED ORDER — ATORVASTATIN CALCIUM 40 MG PO TABS
40.0000 mg | ORAL_TABLET | Freq: Every day | ORAL | Status: DC
  Filled 2016-03-31: qty 1

## 2016-03-31 MED ORDER — HYDRALAZINE HCL 20 MG/ML IJ SOLN: 20.0000 mg | Freq: Four times a day (QID) | INTRAMUSCULAR | Status: DC | PRN

## 2016-03-31 MED ORDER — LORAZEPAM 2 MG/ML IJ SOLN: 1.0000 mg | Freq: Four times a day (QID) | INTRAMUSCULAR | Status: DC | PRN

## 2016-03-31 MED ORDER — SODIUM CHLORIDE 0.9 % IV SOLN
INTRAVENOUS | Status: DC
  Administered 2016-03-31: 03:00:00 via INTRAVENOUS

## 2016-03-31 MED ORDER — AMLODIPINE BESYLATE 5 MG PO TABS
5.0000 mg | ORAL_TABLET | Freq: Every day | ORAL | Status: DC
  Administered 2016-03-31 – 2016-04-01 (×2): 5 mg via ORAL
  Filled 2016-03-31 (×2): qty 1

## 2016-03-31 MED ORDER — VITAMIN B-1 100 MG PO TABS
100.0000 mg | ORAL_TABLET | Freq: Every day | ORAL | Status: DC
  Administered 2016-03-31 – 2016-04-01 (×2): 100 mg via ORAL
  Filled 2016-03-31 (×2): qty 1

## 2016-03-31 MED ORDER — ADULT MULTIVITAMIN W/MINERALS CH
1.0000 | ORAL_TABLET | Freq: Every day | ORAL | Status: DC
  Administered 2016-03-31 – 2016-04-01 (×2): 1 via ORAL
  Filled 2016-03-31 (×2): qty 1

## 2016-03-31 MED ORDER — THIAMINE HCL 100 MG/ML IJ SOLN
100.0000 mg | Freq: Every day | INTRAMUSCULAR | Status: DC
  Filled 2016-03-31: qty 2

## 2016-03-31 NOTE — ED Notes (Signed)
Returns from MRI

## 2016-03-31 NOTE — ED Notes (Signed)
Pt eating lunch and tolerating well. 

## 2016-03-31 NOTE — ED Notes (Signed)
Taken to MRI at this time.  Assisted to ambulate to bathroom.  Gait slightly unsteady.  Tolerated well with 1 person assist.

## 2016-03-31 NOTE — ED Notes (Addendum)
Pt arrives last night with c/o AMS and left leg weakness. Pt found in floor at home. Pt positive for CVA. Elevated CK. Last neuro check at 1400. Pt had bladder scan was 999cc  but then voided to toilet and did not measure. Denies pain. Stroke swallow passed and did eat breakfast and lunch and tolerated well. Morning meds delayed due to pt being off floor for MRA.

## 2016-03-31 NOTE — ED Notes (Signed)
MD at bedside. 

## 2016-03-31 NOTE — ED Notes (Signed)
Pt returns to MRI

## 2016-03-31 NOTE — ED Notes (Signed)
A [employer] diet ordered for lunch.

## 2016-03-31 NOTE — Consult Note (Signed)
Neurology Consultation Reason for Consult: stroke Referring Physician: Dr Eliseo Squires   HPI: Timothy Duncan is a 75 y.o. male with history of hypertension and past history of per embolism previously was treated with Coumadin was brought to the emergency room by the family with complains of altered mental status Patient lives by himself, around 8:30 PM last night he was found on the floor awake but confused. Patient does not remember what happened but currently he is alert and oriented 1. On exam he has mild right leg weakness. MRI brain showed small stroke in pons and 2 areas of subacute stroke in the corpus callosum and cerebellum and also shows significant signs of cerebral atrophy and small vessel disease EKG sinus Currently denies any chest pain fever chills night sweats no change in bowel or urinary habits Past Medical History:  Diagnosis Date  . ED (erectile dysfunction)   . Hypertension   Pulmonary embolism   Past surgical history Low back surgery  Family history Hypertension  Social History:  reports that he has quit smoking. He has never used smokeless tobacco. He reports that he does not drink alcohol or use drugs.   Exam: Current vital signs: BP 123/75   Pulse 94   Temp 98.7 F (37.1 C) (Oral)   Resp 22   Ht 5\' 10"  (1.778 m)   Wt 104.3 kg (230 lb)   SpO2 92%   BMI 33.00 kg/m  Vital signs in last 24 hours: Temp:  [98.7 F (37.1 C)] 98.7 F (37.1 C) (10/01 2122) Pulse Rate:  [79-108] 94 (10/02 0830) Resp:  [14-24] 22 (10/02 0830) BP: (123-165)/(71-109) 123/75 (10/02 0830) SpO2:  [91 %-96 %] 92 % (10/02 0830) Weight:  [104.3 kg (230 lb)] 104.3 kg (230 lb) (10/01 2123)  Physical Exam  Constitutional: Appears well-developed and well-nourished.  Psych: Affect appropriate to situation Eyes: No scleral injection HENT: No OP obstrucion Head: Normocephalic.  Cardiovascular: Normal rate and regular rhythm.  Respiratory: Effort normal and breath sounds normal to  anterior ascultation GI: Soft.  No distension. There is no tenderness.  Skin: WDI  Neuro: Mental Status: Patient is awake, alert, oriented x1 No signs of aphasia or neglect Cranial Nerves: II: Visual Fields are full. Pupils are equal, round, and reactive to light.   III,IV, VI: EOMI without ptosis or diploplia.  V: Facial sensation is symmetric to temperature VII: Facial movement is symmetric.  VIII: hearing is intact to voice X: Uvula elevates symmetrically XI: Shoulder shrug is symmetric. XII: tongue is midline without atrophy or fasciculations.  Motor: Very mild right leg weakness 4 out of 5 Rest of the motor exam is within normal limits Sensory: Sensation is symmetric to light touch and temperature in the arms and legs. Deep Tendon Reflexes: Diffusely absent Plantars: Toes are downgoing bilaterally.  Cerebellar: FNF and HKS are intact bilaterally      I have reviewed the images obtained MRI brain Acute 5 mm nonhemorrhagic infarction involving the RIGHT paramedian pons. One, and possibly two other areas of subacute infarction in the corpus callosum and cerebellum. Considerations would include multiple lacunes, versus shower of emboli.  Extensive atrophy and small vessel disease with evidence for both chronic lacunar infarcts and chronic hemorrhage, likely sequelae of hypertensive cerebrovascular disease.  Assessment and recommendation 75 year old male with history of hypertension and remote history of pulmonary embolism came in with the altered mental status and right leg weakness. MRI brain shows pontine stroke and neck to areas of subacute stroke and corpus callosum  and cerebellum and also shows very significant signs of small vessel disease and cerebral atrophy. Agree with the current workup and management. Admit patient for complete stroke workup. Permissive hypertension Aspirin and statin therapy for stroke prevention. Echocardiogram, hemoglobin A1c, lipid  panel, MRA head and neck    Lurena Joiner, MD 8:55 AM

## 2016-03-31 NOTE — Progress Notes (Signed)
Patient is currently trying to leave AMA. Patient's son in room trying to convince patient not to leave. Hospitalist paged.

## 2016-03-31 NOTE — ED Notes (Signed)
Talking on phone with family.

## 2016-03-31 NOTE — H&P (Signed)
History and Physical    Timothy Duncan [id number] DOB: March 15, 1941 DOA: 03/30/2016  PCP: Joycelyn Man, MD   Patient coming from: Home  Chief Complaint: Found down by his son and daughter-in-law with altered mental status  HPI: Timothy Duncan is a 75 y.o. gentleman with a history of HTN and PE (PCP recently stopped warfarin after therapy for one year).  He was last known to be in his baseline state of health last night when he spoke to his son in [place] by phone.  After that, the history of unclear and the patient is not a reliable historian (family reports clear confabulations, hallucinations).  His son who lives here in town because worried today when the patient was not answering his phone to confirm dinner plans.  The patient's son and his wife showed up at his house around 8:30pm and found the patient awake and oriented but on the ground in his home, unable to get up to due to weakness.  Family reports that it appeared that he did not sleep in his bed last night.  However, the patient is unable to report how long he was down in the ground.  There was no sign of bowel or bladder incontinence.  Currently, the patient denies headache or vision disturbance.  No LOC.  No chest pain or shortness of breath.  No nausea or vomiting.  No fevers, chills, or sweats.  No dysuria.  No diarrhea.  When asked about weakness, he says "my equilibrium is off" and also reports right hip/thigh pain.  He denies pain in his lower back.  ED Course: Head CT negative for acute process.  Chest xray negative for acute process.  Negative EtOH level.  Negative troponin.  Unremarkable U/A.  Negative UDS.  BUN 22, creatinine 1.6 (normal two weeks ago).  CPK level 1021.  Hospitalist asked to admit for further evaluation.   I think he needs MRI of his brain to rule out acute CVA; family is concerned abut his lower back.  Review of Systems: As per HPI otherwise 10 point review of systems negative.    Past Medical  History:  Diagnosis Date  . ED (erectile dysfunction)   . 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 APPENDECTOMY    . CATARACT EXTRACTION    . COLONOSCOPY    . LUMBAR LAMINECTOMY/DECOMPRESSION MICRODISCECTOMY Right 05/03/2015   Procedure: Microdiscectomy - Lumbar four-Lumbar five - right ;  Surgeon: Eustace Moore, MD;  Location: [facility];  Service: Neurosurgery;  Laterality: Right;  . TONSILLECTOMY    . WOUND EXPLORATION N/A 06/06/2015   Procedure: Presumed discitis, Lumbar exploration,Repeat discectomy Lumbar four-five with deep tissue culture;  Surgeon: Eustace Moore, MD;  Location: [facility];  Service: Neurosurgery;  Laterality: N/A;     reports that he has quit smoking. He has never used smokeless tobacco. He reports that he does not drink alcohol or use drugs.  History of alcohol dependence but reports being clean for several years now. He is not married.  He has three sons.  Still works as a Engineer, maintenance (IT).  No Known Allergies  History reviewed. No pertinent family history.  He denies any family history of heart disease or CVA.  His mom lived to be 59 and died "of old age".  He did not know his father.  He was an only child.   Prior to Admission medications   Medication  Sig Start Date End Date Taking? Authorizing Provider  losartan-hydrochlorothiazide Konrad Penta) 50-12.5 MG tablet Take 1/2 tablet by mouth in the morning 03/19/16  Yes Dorena Cookey, MD  sildenafil (REVATIO) 20 MG tablet Take 1 tablet (20 mg total) by mouth at bedtime as needed. Patient taking differently: Take 20 mg by mouth at bedtime as needed (ED).  03/19/16  Yes Dorena Cookey, MD    Physical Exam: Vitals:   03/30/16 2144 03/30/16 2145 03/30/16 2230 03/30/16 2300  BP: 124/92  138/80 148/92  Pulse:  103 91 94  Resp: 24 18 22 14   Temp:      TempSrc:      SpO2:  92% 94% 94%  Weight:      Height:          Constitutional: NAD,  calm, comfortable Vitals:   03/30/16 2144 03/30/16 2145 03/30/16 2230 03/30/16 2300  BP: 124/92  138/80 148/92  Pulse:  103 91 94  Resp: 24 18 22 14   Temp:      TempSrc:      SpO2:  92% 94% 94%  Weight:      Height:       Eyes: PERRL, lids and conjunctivae normal ENMT: Mucous membranes are moist. Posterior pharynx clear of any exudate or lesions. Normal dentition.  Neck: normal appearance, supple, no masses Respiratory: clear to auscultation bilaterally, no wheezing, no crackles. Normal respiratory effort. No accessory muscle use.  Cardiovascular: Normal rate, regular rhythm, no murmurs / rubs / gallops. No extremity edema. 2+ pedal pulses. No carotid bruits.  GI: abdomen is soft and compressible.  No distention.  No tenderness.  No masses palpated.  Bowel sounds are present. Musculoskeletal:  No joint deformity in upper and lower extremities. Good ROM, no contractures. Normal muscle tone.  Pain in his back with straight leg raise on the right.  No TTP over lumbar spine or paraspinous muscles. Skin: no rashes, warm and dry Neurologic: CN 2-12 grossly intact. Sensation intact, Strength symmetric bilaterally, 5/5  Psychiatric: Normal judgment and insight. Alert and oriented x 3. Normal mood.     Labs on Admission: I have personally reviewed following labs and imaging studies  CBC:  Recent Labs Lab 03/30/16 2136 03/30/16 2147  WBC 18.9*  --   NEUTROABS 15.7*  --   HGB 17.1* 17.7*  HCT 50.8 52.0  MCV 92.9  --   PLT 251  --    Basic Metabolic Panel:  Recent Labs Lab 03/30/16 2136 03/30/16 2147  NA 144 145  K 4.3 4.1  CL 111 111  CO2 18*  --   GLUCOSE 98 100*  BUN 19 22*  CREATININE 1.75* 1.60*  CALCIUM 9.8  --    GFR: Estimated Creatinine Clearance: 48.2 mL/min (by C-G formula based on SCr of 1.6 mg/dL (H)). Liver Function Tests:  Recent Labs Lab 03/30/16 2136  AST 30  ALT 24  ALKPHOS 64  BILITOT 3.3*  PROT 7.9  ALBUMIN 4.7   Coagulation  Profile:  Recent Labs Lab 03/30/16 2136  INR 1.02   Cardiac Enzymes:  Recent Labs Lab 03/30/16 2136  CKTOTAL 1,021*   CBG:  Recent Labs Lab 03/30/16 2134  GLUCAP 107*   Urine analysis:    Component Value Date/Time   COLORURINE YELLOW 03/30/2016 2200   APPEARANCEUR CLEAR 03/30/2016 2200   LABSPEC 1.020 03/30/2016 2200   PHURINE 6.0 03/30/2016 2200   GLUCOSEU NEGATIVE 03/30/2016 2200   HGBUR TRACE (A) 03/30/2016 2200   HGBUR negative  04/19/2009 1004   BILIRUBINUR NEGATIVE 03/30/2016 2200   BILIRUBINUR n 03/19/2016 1104   KETONESUR 15 (A) 03/30/2016 2200   PROTEINUR NEGATIVE 03/30/2016 2200   UROBILINOGEN 0.2 03/19/2016 1104   UROBILINOGEN 0.2 06/30/2009 0051   NITRITE NEGATIVE 03/30/2016 2200   LEUKOCYTESUR NEGATIVE 03/30/2016 2200    Radiological Exams on Admission: Dg Chest 2 View  Result Date: 03/30/2016 CLINICAL DATA:  75 y/o  M; confusion and falling. EXAM: CHEST  2 VIEW COMPARISON:  11/21/2014 chest radiograph. FINDINGS: Stable enlarged cardiac silhouette. Prominent epicardial fat pad. Low lung volumes. No focal consolidation. No pleural effusion or pneumothorax. No acute osseous abnormality is evident. IMPRESSION: No active cardiopulmonary disease. Electronically Signed   By: Kristine Garbe M.D.   On: 03/30/2016 23:40   Ct Head Wo Contrast  Result Date: 03/30/2016 CLINICAL DATA:  Confusion and disorientation for 3 hours. EXAM: CT HEAD WITHOUT CONTRAST TECHNIQUE: Contiguous axial images were obtained from the base of the skull through the vertex without intravenous contrast. COMPARISON:  None. FINDINGS: Brain: Diffuse cerebral atrophy. Mild ventricular dilatation consistent with central atrophy. Low-attenuation changes in the deep white matter consistent with small vessel ischemia. No mass effect or midline shift. No abnormal extra-axial fluid collections. Gray-white matter junctions are distinct. Basal cisterns are not effaced. No evidence of acute  intracranial hemorrhage. Vascular: Atherosclerotic vascular calcifications are present. Skull: Normal. Negative for fracture or focal lesion. Sinuses/Orbits: No acute finding. Other: None. IMPRESSION: No acute intracranial abnormalities. Chronic atrophy and small vessel ischemic changes. Electronically Signed   By: Lucienne Capers M.D.   On: 03/30/2016 23:20    EKG: Independently reviewed. NSR.  LBBB, previously documented.  Assessment/Plan Principal Problem:   AKI (acute kidney injury) ([place]) Active Problems:   Delirium   Right leg weakness   Alcohol dependence ([place])      Acute metabolic encephalopathy/acute delirium, etiology unclear --MRI brain and lumbar spine now --Consider neurology consult in the AM, particularly if imaging is negative.  Of course, he will need neuro consult if CVA is diagnosed as well.  He will also need complete stroke work up, including speech eval if MRI positive for CVA. --Wernicke's encephalopathy is on the differential as well, though I am not sure of the probability of this diagnosis if the patient is not actively drinking.  Family does not seem to be certain that patient has truly been abstaining from EtOH.  Empiric IV thiamine 100mg  daily for now.  Consider increased dosing if no other cause for his symptoms is found. --Does not appear infected at this time. --Serial troponin, check TSH  AKI --Hydrate with NS --Hold ARB/diuretic --Repeat BMP in AM  HTN --Use amlodipine 5mg  daily for HTN for now --IV hydralazine prn with parameters  Mild rhabdo, found down --Expect CK to improve with hydration   DVT prophylaxis: SCDs Code Status: FULL Family Communication: Son and daughter-in-law at bedside at time of admission Disposition Plan: To be determined. Consults called: NONE Admission status: Observation, telemetry    TIME SPENT: 70 minutes   Eber Jones MD [facility] Pager [phone]  If 7PM-7AM, please contact  night-coverage [email or web address] Password [email or web address]  03/31/2016, 1:51 AM

## 2016-03-31 NOTE — ED Notes (Signed)
Dr. Eliseo Squires contacted. For confirmation of fluid rate. Request bladder scan.

## 2016-03-31 NOTE — ED Notes (Addendum)
Pt moved to hospital bed.

## 2016-03-31 NOTE — Progress Notes (Signed)
Patient admitted after midnight, please see H&P.  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  Currently in sinus rhythm.  Neuro consult placed.  Echo, carotid and FLP added along with ASA  Eulogio Bear DO

## 2016-03-31 NOTE — ED Notes (Signed)
Pt returns to bed with assist after out to br.

## 2016-03-31 NOTE — Progress Notes (Signed)
PT Cancellation Note  Patient Details Name: Timothy Duncan MRN: [id number] DOB: [birth year]   Cancelled Treatment:    Reason Eval/Treat Not Completed: Patient at procedure or test/unavailable   Noted pt currently in MRI for lumbar spine and brain. Will monitor for results and appropriateness to proceed with activty.   Vickie Ponds 03/31/2016, 8:14 AM Pager [phone]

## 2016-03-31 NOTE — Evaluation (Signed)
Physical Therapy Evaluation Patient Details Name: Timothy Duncan MRN: [id number] DOB: 07-12-1940 Today's Date: 03/31/2016   History of Present Illness  75 y.o.gentleman with a history of HTN and PE (PCP recently stopped warfarin after therapy for one year) admitted after fall with inability to get up. Son found patient and appears he was on the floor overnight. He reported "my equilibrium was off" all weekend and also reports right hip/thigh pain. 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 Significant PMHx- back surgery, DVT    Clinical Impression  Pt admitted with above diagnosis. Patient demonstrated decreased awareness of deficits and safety with multiple losses of balance during eval. He adamantly refused to discuss any discharge plan other than home. He lives alone and could benefit from a Inpatient Rehab stay, however doubt he will agree to this. Pt currently with functional limitations due to the deficits listed below (see PT Problem List).  Pt will benefit from skilled PT to increase their independence and safety with mobility to allow discharge to the venue listed below.       Follow Up Recommendations CIR (however pt currently refusing anything except HHPT)    Equipment Recommendations  Rolling walker with 5" wheels    Recommendations for Other Services Rehab consult;OT consult     Precautions / Restrictions Precautions Precautions: Fall Restrictions Weight Bearing Restrictions: No      Mobility  Bed Mobility Overal bed mobility: Needs Assistance Bed Mobility: Supine to Sit     Supine to sit: Min assist     General bed mobility comments: heavy use of rail and could not achieve upright without min assist to raise torso  Transfers Overall transfer level: Needs assistance Equipment used: Rolling walker (2 wheeled);None Transfers: Sit to/from  [employer] to Stand: Min assist Stand pivot transfers: Min assist       General transfer comment: uses wide base of support and is still unsteady as he comes to stand; reaching for UE support  Ambulation/Gait Ambulation/Gait assistance: Min assist Ambulation Distance (Feet): 50 Feet Assistive device: Rolling walker (2 wheeled) Gait Pattern/deviations: Step-through pattern;Decreased stride length;Shuffle;Wide base of support   Gait velocity interpretation: Below normal speed for age/gender General Gait Details: pushing RW too far ahead (persisted even with cues); slight staggering LOB in turns (requires incr number of steps and poor coordination with RW)  Stairs            Wheelchair Mobility    Modified Rankin (Stroke Patients Only) Modified Rankin (Stroke Patients Only) Pre-Morbid Rankin Score: No significant disability Modified Rankin: Moderately severe disability     Balance Overall balance assessment: Needs assistance Sitting-balance support: No upper extremity supported;Feet supported Sitting balance-Leahy Scale: Fair Sitting balance - Comments: difficulty maintaining balance as donning socks (he crosses one leg over other to reach foot)   Standing balance support: No upper extremity supported Standing balance-Leahy Scale: Poor Standing balance comment: +excessive anterior-posterior sway, seeks UE support         [place]berg - Eyes Opened:  (unable to achieve position; )     High Level Balance Comments: with wide BOS in standing, unable to close eyes >2 seconds due to incr ant-post sway             Pertinent Vitals/Pain Pain Assessment: Faces Faces Pain Scale: Hurts little more Pain Location: Rt hip and low back Pain Descriptors / Indicators: Discomfort Pain Intervention(s):  Limited activity within patient's tolerance    Home Living Family/patient expects to be discharged to:: Private residence Living Arrangements:  Alone Available Help at Discharge: Family;Available PRN/intermittently (son and daughter in law) Type of Home: House Home Access: Stairs to enter Entrance Stairs-Rails: None Entrance Stairs-Number of Steps: 2 Home Layout: Two level;Able to live on main level with bedroom/bathroom Home Equipment: None Additional Comments: Son and daughter-in-law live in [place] - available as needed    Prior Function Level of Independence: Independent         Comments: used RW (borrowed) after back surgery and no longer needed     Hand Dominance   Dominant Hand: Right    Extremity/Trunk Assessment   Upper Extremity Assessment: Defer to OT evaluation           Lower Extremity Assessment: RLE deficits/detail;LLE deficits/detail;Generalized weakness RLE Deficits / Details: ankle DF slightly weaker than Lt (remains 5/5)    Cervical / Trunk Assessment: Kyphotic  Communication   Communication: HOH  Cognition Arousal/Alertness: Awake/alert Behavior During Therapy: Anxious Overall Cognitive Status: No family/caregiver present to determine baseline cognitive functioning Area of Impairment: Memory;Safety/judgement;Awareness     Memory: Decreased short-term memory (thought he was on the floor 30-45 minutes)   Safety/Judgement: Decreased awareness of safety;Decreased awareness of deficits (I'm going home tomorrow..it will be fine) Awareness:  (pre-intellectual )   General Comments: States he does not know any test results (unaware he has had CVA) ?accurate as neurology note clearly states CVA as reason for admission    General Comments      Exercises     Assessment/Plan    PT Assessment Patient needs continued PT services  PT Problem List Decreased balance;Decreased mobility;Decreased coordination;Decreased cognition;Decreased knowledge of use of DME;Decreased safety awareness;Decreased knowledge of precautions;Pain          PT Treatment Interventions DME instruction;Gait  training;Stair training;Functional mobility training;Therapeutic activities;Therapeutic exercise;Balance training;Neuromuscular re-education;Cognitive remediation;Patient/family education    PT Goals (Current goals can be found in the Care Plan section)  Acute Rehab PT Goals Patient Stated Goal: go home tomorrow--I'm not kidding PT Goal Formulation: With patient Time For Goal Achievement: 04/07/16 Potential to Achieve Goals: Good    Frequency Min 4X/week   Barriers to discharge Decreased caregiver support lives alone    Co-evaluation               End of Session Equipment Utilized During Treatment: Gait belt Activity Tolerance: Patient tolerated treatment well Patient left: in chair;with call bell/phone within reach;with chair alarm set;with nursing/sitter in room (admission RN) Nurse Communication: Mobility status    Functional Assessment Tool Used: clinical judgement Functional Limitation: Mobility: Walking and moving around Mobility: Walking and Moving Around Current Status [phone]): At least 40 percent but less than 60 percent impaired, limited or restricted Mobility: Walking and Moving Around Goal Status [phone]): At least 1 percent but less than 20 percent impaired, limited or restricted    Time: 1559-1619 PT Time Calculation (min) (ACUTE ONLY): 20 min   Charges:   PT Evaluation $PT Eval Low Complexity: 1 Procedure     PT G Codes:   PT G-Codes **NOT FOR INPATIENT CLASS** Functional Assessment Tool Used: clinical judgement Functional Limitation: Mobility: Walking and moving around Mobility: Walking and Moving Around Current Status [id number]): At least 40 percent but less than 60 percent impaired, limited or restricted Mobility: Walking and Moving Around Goal Status [phone]): At least 1 percent but less than 20 percent impaired, limited or restricted  Robynn Marcel 03/31/2016, 4:55 PM Pager [phone]

## 2016-03-31 NOTE — ED Notes (Signed)
Pt returns from MRI ° °

## 2016-03-31 NOTE — ED Notes (Signed)
Call with any questions: Son [name] [phone], [name] [phone]

## 2016-04-01 ENCOUNTER — Inpatient Hospital Stay (HOSPITAL_COMMUNITY): Payer: Medicare HMO

## 2016-04-01 ENCOUNTER — Telehealth: Payer: Self-pay | Admitting: Family Medicine

## 2016-04-01 DIAGNOSIS — F1020 Alcohol dependence, uncomplicated: Secondary | ICD-10-CM

## 2016-04-01 DIAGNOSIS — Z86718 Personal history of other venous thrombosis and embolism: Secondary | ICD-10-CM

## 2016-04-01 DIAGNOSIS — I6789 Other cerebrovascular disease: Secondary | ICD-10-CM

## 2016-04-01 DIAGNOSIS — I639 Cerebral infarction, unspecified: Principal | ICD-10-CM

## 2016-04-01 LAB — CBC
HEMATOCRIT: 46.8 % (ref 39.0–52.0)
Hemoglobin: 14.7 g/dL (ref 13.0–17.0)
MCH: 30.2 pg (ref 26.0–34.0)
MCHC: 31.4 g/dL (ref 30.0–36.0)
MCV: 96.1 fL (ref 78.0–100.0)
PLATELETS: 227 10*3/uL (ref 150–400)
RBC: 4.87 MIL/uL (ref 4.22–5.81)
RDW: 14.9 % (ref 11.5–15.5)
WBC: 9.8 10*3/uL (ref 4.0–10.5)

## 2016-04-01 LAB — BASIC METABOLIC PANEL
ANION GAP: 4 — AB (ref 5–15)
BUN: 22 mg/dL — AB (ref 6–20)
CHLORIDE: 112 mmol/L — AB (ref 101–111)
CO2: 26 mmol/L (ref 22–32)
Calcium: 9.2 mg/dL (ref 8.9–10.3)
Creatinine, Ser: 1.34 mg/dL — ABNORMAL HIGH (ref 0.61–1.24)
GFR calc Af Amer: 58 mL/min — ABNORMAL LOW (ref >60)
GFR calc non Af Amer: 50 mL/min — ABNORMAL LOW (ref >60)
GLUCOSE: 86 mg/dL (ref 65–99)
POTASSIUM: 4.4 mmol/L (ref 3.5–5.1)
Sodium: 142 mmol/L (ref 135–145)

## 2016-04-01 LAB — LIPID PANEL
CHOL/HDL RATIO: 3.6 ratio
Cholesterol: 164 mg/dL (ref 0–200)
HDL: 45 mg/dL (ref >40)
LDL CALC: 99 mg/dL (ref 0–99)
Triglycerides: 101 mg/dL (ref <150)
VLDL: 20 mg/dL (ref 0–40)

## 2016-04-01 LAB — HEMOGLOBIN A1C
Hgb A1c MFr Bld: 5.8 % — ABNORMAL HIGH (ref 4.8–5.6)
Mean Plasma Glucose: 120 mg/dL

## 2016-04-01 LAB — ECHOCARDIOGRAM COMPLETE
HEIGHTINCHES: 70 in
WEIGHTICAEL: 3744 [oz_av]

## 2016-04-01 LAB — CK: CK TOTAL: 1197 U/L — AB (ref 49–397)

## 2016-04-01 MED ORDER — ASPIRIN 81 MG PO TBEC: 81.0000 mg | DELAYED_RELEASE_TABLET | Freq: Every day | ORAL | Status: DC

## 2016-04-01 MED ORDER — ATORVASTATIN CALCIUM 40 MG PO TABS: 40.0000 mg | ORAL_TABLET | Freq: Every day | ORAL | 0 refills | Status: DC

## 2016-04-01 NOTE — Progress Notes (Signed)
STROKE TEAM PROGRESS NOTE   SUBJECTIVE (INTERVAL HISTORY) His RN is at the bedside.  Overall he feels his condition is completely resolved. He stated that he fell yesterday not able to get up due to right hip pain, not because of right leg weakness. Now hip pain is gone and he is back to his baseline. He denies any left sided weakness. MRI showed right pontine small infarct.    OBJECTIVE Temp:  [97.5 F (36.4 C)-98.3 F (36.8 C)] 98.2 F (36.8 C) (10/03 0446) Pulse Rate:  [65-88] 76 (10/03 0446) Cardiac Rhythm: Normal sinus rhythm (10/03 0736) Resp:  [17-20] 18 (10/03 0446) BP: (126-174)/(64-76) 144/72 (10/03 0446) SpO2:  [94 %-96 %] 96 % (10/03 0446) Weight:  [234 lb (106.1 kg)] 234 lb (106.1 kg) (10/02 1444)   Recent Labs Lab 03/30/16 2134  GLUCAP 107*    Recent Labs Lab 03/30/16 2136 03/30/16 2147 03/31/16 0240 04/01/16 0202  NA 144 145 143 142  K 4.3 4.1 3.6 4.4  CL 111 111 113* 112*  CO2 18*  --  22 26  GLUCOSE 98 100* 110* 86  BUN 19 22* 17 22*  CREATININE 1.75* 1.60* 1.62* 1.34*  CALCIUM 9.8  --  9.3 9.2    Recent Labs Lab 03/30/16 2136 03/31/16 0240  AST 30 37  ALT 24 24  ALKPHOS 64 58  BILITOT 3.3* 3.7*  PROT 7.9 7.4  ALBUMIN 4.7 4.4    Recent Labs Lab 03/30/16 2136 03/30/16 2147 04/01/16 0202  WBC 18.9*  --  9.8  NEUTROABS 15.7*  --   --   HGB 17.1* 17.7* 14.7  HCT 50.8 52.0 46.8  MCV 92.9  --  96.1  PLT 251  --  227    Recent Labs Lab 03/30/16 2136 03/31/16 0240 03/31/16 0355 03/31/16 1529 04/01/16 0202  CKTOTAL 1,021* 1,774*  --   --  1,197*  TROPONINI  --  0.06* 0.05* 0.05*  --     Recent Labs  03/30/16 2136  LABPROT 13.4  INR 1.02    Recent Labs  03/30/16 2200  COLORURINE YELLOW  LABSPEC 1.020  PHURINE 6.0  GLUCOSEU NEGATIVE  HGBUR TRACE*  BILIRUBINUR NEGATIVE  KETONESUR 15*  PROTEINUR NEGATIVE  NITRITE NEGATIVE  LEUKOCYTESUR NEGATIVE       Component Value Date/Time   CHOL 164 04/01/2016 0202   TRIG 101  04/01/2016 0202   TRIG 149 07/06/2006 0926   HDL 45 04/01/2016 0202   CHOLHDL 3.6 04/01/2016 0202   VLDL 20 04/01/2016 0202   LDLCALC 99 04/01/2016 0202   Lab Results  Component Value Date   HGBA1C 5.8 (H) 03/31/2016      Component Value Date/Time   LABOPIA NONE DETECTED 03/30/2016 2200   COCAINSCRNUR NONE DETECTED 03/30/2016 2200   LABBENZ NONE DETECTED 03/30/2016 2200   AMPHETMU NONE DETECTED 03/30/2016 2200   THCU NONE DETECTED 03/30/2016 2200   LABBARB NONE DETECTED 03/30/2016 2200     Recent Labs Lab 03/30/16 2136  ETH <5    I have personally reviewed the radiological images below and agree with the radiology interpretations.  Ct Head Wo Contrast 03/30/2016 IMPRESSION: No acute intracranial abnormalities. Chronic atrophy and small vessel ischemic changes.    [facility] Wo Contrast 03/31/2016 IMPRESSION: Suspicion of 50% stenosis of the proximal left vertebral artery. This vessel is quite tortuous and this appearance could be explained by the tortuous course, though I favor a true stenosis. Beyond that, the left vertebral artery is  widely patent to the basilar. The right vertebral artery is dominant and normal. No carotid bifurcation disease. No intracranial atherosclerosis or occlusion.  No basilar disease. 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   [facility] Wo Contrast 03/31/2016 IMPRESSION: Acute 5 mm nonhemorrhagic infarction involving the RIGHT paramedian pons. One, and possibly two other areas of subacute infarction in the corpus callosum and cerebellum. Considerations would include multiple lacunes, versus shower of emboli. Extensive atrophy and small vessel disease with evidence for both chronic lacunar infarcts and chronic hemorrhage, likely sequelae of hypertensive  cerebrovascular disease.   2D Echocardiogram  Pending  LE venous doppler - Bilateral lower extremities are negative for acute deep vein thrombosis. 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 There is no evidence of Baker's cyst.    PHYSICAL EXAM  Temp:  [97.5 F (36.4 C)-98.3 F (36.8 C)] 98.2 F (36.8 C) (10/03 0446) Pulse Rate:  [65-88] 76 (10/03 0446) Resp:  [17-20] 18 (10/03 0446) BP: (126-174)/(64-76) 144/72 (10/03 0446) SpO2:  [94 %-96 %] 96 % (10/03 0446) Weight:  [234 lb (106.1 kg)] 234 lb (106.1 kg) (10/02 1444)  General - Well nourished, well developed, in no apparent distress.  Ophthalmologic - Fundi not visualized due to eye movement.  Cardiovascular - Regular rate and rhythm with no murmur.  Neck - supple, no carotid bruits  Mental Status -  Level of arousal and orientation to time, place, and person were intact. Language including expression, naming, repetition, comprehension was assessed and found intact. Fund of Knowledge was assessed and was intact.  Cranial Nerves II - XII - II - Visual field intact OU. III, IV, VI - Extraocular movements intact. V - Facial sensation intact bilaterally. VII - Facial movement intact bilaterally. VIII - Hearing & vestibular intact bilaterally. X - Palate elevates symmetrically. XI - Chin turning & shoulder shrug intact bilaterally. XII - Tongue protrusion intact.  Motor Strength - The patient's strength was normal in all extremities and pronator drift was absent.  Bulk was normal and fasciculations were absent.   Motor Tone - Muscle tone was assessed at the neck and appendages and was normal.  Reflexes - The patient's reflexes were symmetrical in all extremities and he had no pathological reflexes.  Sensory - Light touch, temperature/pinprick were assessed and were symmetrical.    Coordination - The patient had normal movements in the hands and feet with no ataxia or  dysmetria.  Tremor was absent.  Gait and Station - The patient's transfers, posture, gait, station, and turns were observed as normal.   ASSESSMENT/PLAN Timothy Duncan is a 75 y.o. 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 Symptoms resolved.    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 Could be small vessel disease source vs. cardioembolic.  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 Yes; Fluid consistency: Thin  Diet - low sodium heart healthy   aspirin 81 mg daily prior to admission, now  on aspirin 81 mg daily. Continue ASA on discharge.  Patient counseled to be compliant with his antithrombotic medications  Ongoing aggressive stroke risk factor management  Hx of DVT / PE  In 07/2015  On coumadin until 2 weeks ago  PCP took him off coumadin  Repeat LE venous doppler - negative for acute DVT, but chronic right popliteal vein  Hypertension  Home meds:   Hyzaar Permissive hypertension (OK if <220/120) for 24-48 hours post stroke and then gradually normalized within 5-7 days. Stable  Patient counseled to be compliant with his blood pressure medications  Hyperlipidemia  Home meds:  none   Currently on lipitor 40  LDL 96, goal < 70  Continue statin at discharge  Other Stroke Risk Factors  Advanced age  Obesity, Body mass index is 33.58 kg/m.   Other Active Problems  Elevated cre  Other Pertinent History    Hospital day # 1  Neurology will sign off. Please call with questions. Pt will follow up with Cecille Rubin NP at [facility] in about 6 weeks. Thanks for the consult.   Rosalin Hawking, MD  PhD Stroke Neurology 04/01/2016 2:41 PM    To contact Stroke Continuity provider, please refer to [email or web address] After hours, contact General Neurology

## 2016-04-01 NOTE — Progress Notes (Signed)
Inpatient Rehabilitation  Pt is recommending IP Rehab but says pt. is adamantly refusing any discharge plan other than home.  OT is not recommending any OT follow up.   Pt. would not meet the criteria for IP rehab, therefore we are not recommending IP Rehab consult.    [facility] Admissions Coordinator Cell [phone] Office [phone]

## 2016-04-01 NOTE — Progress Notes (Signed)
Pt being discharged from hospital per orders from MD. Pt educated on discharge instructions. Pt verbalized understanding of instructions. All questions and concerns were addressed. Pt's IV was removed before discharge. Pt exited hospital via wheelchair. 

## 2016-04-01 NOTE — Telephone Encounter (Signed)
Pt is at [facility] dx ?stroke. Marland Kitchen Pt daughter in law would like dr todd to call her

## 2016-04-01 NOTE — Discharge Summary (Addendum)
Physician Discharge Summary  Timothy Duncan [id number] DOB: 1940/07/06 DOA: 03/30/2016  PCP: Joycelyn Man, MD  Admit date: 03/30/2016 Discharge date: 04/01/2016   Recommendations for Outpatient Follow-Up:   Outpatient 30 day event monitor- referral placed Alcohol cessation and follow up with cardiology for evaluation of decreased EF (patient insisted on not waiting for results) Cecille Rubin NP at [facility] in about 6 weeks  Discharge Diagnosis:   Principal Problem:   AKI (acute kidney injury) ([place]) Active Problems:   Delirium   Right leg weakness   Alcohol dependence ([place])   Cerebrovascular accident (CVA) ([place])   Acute CVA (cerebrovascular accident) [facility])   Discharge disposition:  Home- refused rehab  Discharge Condition: Improved.  Diet recommendation: Low sodium, heart healthy.  Carbohydrate-modified.  Wound care: None.   History of Present Illness:   Timothy Duncan is a 75 y.o. gentleman with a history of HTN and PE (PCP recently stopped warfarin after therapy for one year).  He was last known to be in his baseline state of health last night when he spoke to his son in [place] by phone.  After that, the history of unclear and the patient is not a reliable historian (family reports clear confabulations, hallucinations).  His son who lives here in town because worried today when the patient was not answering his phone to confirm dinner plans.  The patient's son and his wife showed up at his house around 8:30pm and found the patient awake and oriented but on the ground in his home, unable to get up to due to weakness.  Family reports that it appeared that he did not sleep in his bed last night.  However, the patient is unable to report how long he was down in the ground.  There was no sign of bowel or bladder incontinence.   Hospital Course by Problem:   Stroke:  Incidental finding of right pontine and punctate right posterior periventricular infarct, etiology unclear.  Could be small vessel disease source vs. cardioembolic.  MRI  Right pontine small infarct and a punctate right posterior periventricular infarct  MRA head and neck -  Left VA origin stenosis 2D Echo :Left ventricle: The cavity size was normal. Wall thickness was   increased in a pattern of severe LVH. Systolic function was   severely reduced. The estimated ejection fraction was in the   range of 25% to 30%. Dyskinesis of the basal-midanteroseptal   myocardium. Features are consistent with a pseudonormal left   ventricular filling pattern, with concomitant abnormal relaxation   and increased filling pressure (grade 2 diastolic dysfunction). - Ventricular septum: Septal motion showed abnormal function and   dyssynergy. - Aortic valve: Mildly to moderately calcified annulus.   Recommend 30 day cardiac event monitoring as outpt- referral placed to cardiology  LDL 96- statin  HgbA1c 5.8  aspirin 81 mg daily prior to admission, now on aspirin 81 mg daily. Continue ASA on discharge- not sure pateint would be candidate for more anti-coagulation as he is a fall risk  Patient counseled to be compliant with his antithrombotic medications  Ongoing aggressive stroke risk factor management  Hx of DVT / PE  In 07/2015  On coumadin until 2 weeks ago  PCP took him off coumadin  Repeat LE venous doppler - negative for acute DVT, but chronic right popliteal vein  Hypertension  Home meds:   Hyzaar  Permissive hypertension (OK if <220/120) for 24-48 hours post stroke and then gradually normalized within 5-7 days.  Hyperlipidemia  Home meds:  none   Currently on lipitor 40  LDL 96, goal < 70  Continue statin at discharge  Elevated CK -slowing decreasing -encourage patient to drink water and avoid strenuous exercise  -updated patient's son with information we had at d/c (not echo) with patient's permission   Medical Consultants:    Neuro   Discharge Exam:   Vitals:    04/01/16 0100 04/01/16 0446  BP: (!) 144/72 (!) 144/72  Pulse: 65 76  Resp: 17 18  Temp: 97.6 F (36.4 C) 98.2 F (36.8 C)   Vitals:   03/31/16 1810 03/31/16 2100 04/01/16 0100 04/01/16 0446  BP: (!) 174/76 (!) 141/74 (!) 144/72 (!) 144/72  Pulse: 88 71 65 76  Resp: 18 18 17 18   Temp: 98.3 F (36.8 C) 97.6 F (36.4 C) 97.6 F (36.4 C) 98.2 F (36.8 C)  TempSrc: Oral Oral Oral Oral  SpO2: 94% 95% 96% 96%  Weight:      Height:        Gen:  NAD- demanding to go home-- attempted to leave AMA the night before  The results of significant diagnostics from this hospitalization (including imaging, microbiology, ancillary and laboratory) are listed below for reference.     Procedures and Diagnostic Studies:   Dg Chest 2 View  Result Date: 03/30/2016 CLINICAL DATA:  75 y/o  M; confusion and falling. EXAM: CHEST  2 VIEW COMPARISON:  11/21/2014 chest radiograph. FINDINGS: Stable enlarged cardiac silhouette. Prominent epicardial fat pad. Low lung volumes. No focal consolidation. No pleural effusion or pneumothorax. No acute osseous abnormality is evident. IMPRESSION: No active cardiopulmonary disease. Electronically Signed   By: Kristine Garbe M.D.   On: 03/30/2016 23:40   Ct Head Wo Contrast  Result Date: 03/30/2016 CLINICAL DATA:  Confusion and disorientation for 3 hours. EXAM: CT HEAD WITHOUT CONTRAST TECHNIQUE: Contiguous axial images were obtained from the base of the skull through the vertex without intravenous contrast. COMPARISON:  None. FINDINGS: Brain: Diffuse cerebral atrophy. Mild ventricular dilatation consistent with central atrophy. Low-attenuation changes in the deep white matter consistent with small vessel ischemia. No mass effect or midline shift. No abnormal extra-axial fluid collections. Gray-white matter junctions are distinct. Basal cisterns are not effaced. No evidence of acute intracranial hemorrhage. Vascular: Atherosclerotic vascular calcifications are  present. Skull: Normal. Negative for fracture or focal lesion. Sinuses/Orbits: No acute finding. Other: None. IMPRESSION: No acute intracranial abnormalities. Chronic atrophy and small vessel ischemic changes. Electronically Signed   By: Lucienne Capers M.D.   On: 03/30/2016 23:20   Mr Jodene Nam Head Wo Contrast  Result Date: 03/31/2016 CLINICAL DATA:  Acute posterior circulation infarctions. EXAM: MRA NECK WITHOUT AND WITH CONTRAST MRA HEAD WITHOUT CONTRAST TECHNIQUE: Multiplanar and multiecho pulse sequences of the neck were obtained without and with intravenous contrast. Angiographic images of the neck were obtained using MRA technique without and with intravenous contast.; Angiographic images of the Circle of Willis were obtained using MRA technique without intravenous contrast. CONTRAST:  109mL MULTIHANCE GADOBENATE DIMEGLUMINE 529 MG/ML IV SOLN COMPARISON:  MRI same day FINDINGS: MRA NECK FINDINGS Branching pattern of the brachiocephalic vessels from the arch is normal. No origin stenoses. Both common carotid arteries are widely patent to their respective bifurcation. Both carotid bifurcations are normal without stenosis or irregularity. Both cervical internal carotid arteries are widely patent. The right vertebral artery is dominant and widely patent at its origin and through the cervical region. The left vertebral artery is non dominant. This vessel  is markedly tortuous proximally. I suspect there is a proximal left vertebral artery stenosis, estimated at 50%. Beyond that, the vessel is widely patent and does show antegrade flow without subsequent stenosis. MRA HEAD FINDINGS Both internal carotid arteries are widely patent into the brain. The left internal carotid artery supplies only the left middle cerebral artery territory. The right internal carotid artery supplies the right middle cerebral artery territory and both anterior cerebral artery territories. 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 Both vertebral arteries are patent to the basilar with the right being dominant. No basilar stenosis. Superior cerebellar and posterior cerebral arteries are patent bilaterally. No significant distal vessel atherosclerotic irregularity demonstrable. IMPRESSION: Suspicion of 50% stenosis of the proximal left vertebral artery. This vessel is quite tortuous and this appearance could be explained by the tortuous course, though I favor a true stenosis. Beyond that, the left vertebral artery is widely patent to the basilar. The right vertebral artery is dominant and normal. No carotid bifurcation disease. No intracranial atherosclerosis or occlusion.  No basilar disease. 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 Electronically Signed   By: Nelson Chimes M.D.   On: 03/31/2016 12:58   Mr Angiogram Neck W Contrast  Result Date: 03/31/2016 CLINICAL DATA:  Acute posterior circulation infarctions. EXAM: MRA NECK WITHOUT AND WITH CONTRAST MRA HEAD WITHOUT CONTRAST TECHNIQUE: Multiplanar and multiecho pulse sequences of the neck were obtained without and with intravenous contrast. Angiographic images of the neck were obtained using MRA technique without and with intravenous contast.; Angiographic images of the Circle of Willis were obtained using MRA technique without intravenous contrast. CONTRAST:  101mL MULTIHANCE GADOBENATE DIMEGLUMINE 529 MG/ML IV SOLN COMPARISON:  MRI same day FINDINGS: MRA NECK FINDINGS Branching pattern of the brachiocephalic vessels from the arch is normal. No origin stenoses. Both common carotid arteries are widely patent to their respective bifurcation. Both carotid bifurcations are normal without stenosis or irregularity. Both cervical internal carotid arteries are widely patent.  The right vertebral artery is dominant and widely patent at its origin and through the cervical region. The left vertebral artery is non dominant. This vessel is markedly tortuous proximally. I suspect there is a proximal left vertebral artery stenosis, estimated at 50%. Beyond that, the vessel is widely patent and does show antegrade flow without subsequent stenosis. MRA HEAD FINDINGS Both internal carotid arteries are widely patent into the brain. The left internal carotid artery supplies only the left middle cerebral artery territory. The right internal carotid artery supplies the right middle cerebral artery territory and both anterior cerebral artery territories. I think there is a 2 mm anterior communicating artery aneurysm, not likely significant. Both vertebral arteries are patent to the basilar with the right being dominant. No basilar stenosis. Superior cerebellar and posterior cerebral arteries are patent bilaterally. No significant distal vessel atherosclerotic irregularity demonstrable. IMPRESSION: Suspicion of 50% stenosis of the proximal left vertebral artery. This vessel is quite tortuous and this appearance could be explained by the tortuous course, though I favor a true stenosis. Beyond that, the left vertebral artery is widely patent to the basilar. The right vertebral artery is dominant and normal. No carotid bifurcation disease. No intracranial atherosclerosis or occlusion.  No basilar disease. 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 Electronically Signed   By: Nelson Chimes M.D.   On: 03/31/2016 12:58   [name]  Result Date: 03/31/2016 CLINICAL DATA:  Patient found down. Confusion. RIGHT leg weakness. History of hypertension. EXAM: MRI HEAD WITHOUT CONTRAST TECHNIQUE: Multiplanar, multiecho  pulse sequences of the brain and surrounding structures were obtained without intravenous contrast. COMPARISON:  CT head 03/30/2016. MRI lumbar spine reported separately. FINDINGS: Brain: Small area of acute lacunar infarction RIGHT paramedian pons, 5 mm diameter. Smaller, low-level area of restriction, RIGHT posterior corpus callosum, axial image 31, likely subacute insult. Third equivocal area of restricted diffusion, LEFT superior cerebellar cortex, coronal image 12. No hemorrhage, mass lesion, hydrocephalus, or extra-axial fluid. Generalized atrophy. Asymmetric atrophy of the RIGHT temporal lobe, possible old infarct. Extensive white matter disease. Numerous areas of chronic lacunar infarction. Prominent perivascular spaces. Vascular: Flow voids are maintained. 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 Skull and upper cervical spine: Normal marrow signal. Sinuses/Orbits: BILATERAL cataract extraction.  No layering fluid. Other: Unremarkable mastoids. IMPRESSION: Acute 5 mm nonhemorrhagic infarction involving the RIGHT paramedian pons. One, and possibly two other areas of subacute infarction in the corpus callosum and cerebellum. Considerations would include multiple lacunes, versus shower of emboli. Extensive atrophy and small vessel disease with evidence for both chronic lacunar infarcts and chronic hemorrhage, likely sequelae of hypertensive cerebrovascular disease. Electronically Signed   By: Staci Righter M.D.   On: 03/31/2016 07:24   Mr Lumbar Spine Wo Contrast  Result Date: 03/31/2016 CLINICAL DATA:  Patient found down.  RIGHT leg weakness. EXAM: MRI LUMBAR SPINE WITHOUT CONTRAST TECHNIQUE: Multiplanar, multisequence MR imaging of the lumbar spine was performed. No intravenous contrast was administered. COMPARISON:  05/31/2015 MRI lumbar FINDINGS: Segmentation: 5 lumbar type vertebrae corresponds with prior  numbering. Alignment:  3 mm anterolisthesis L4-5, otherwise anatomic. Vertebrae:  No fracture, evidence of discitis, or bone lesion. Conus medullaris: Extends to the L1 level and appears normal. Paraspinal and other soft tissues: Marked bladder distention, question outlet obstruction. Prior lumbar discectomy at L4-5, with scarring posteriorly. Stable renal cystic disease. Disc levels: L1-L2:  Unremarkable. L2-L3:  Mild facet arthropathy.  No impingement. L3-L4:  Mild facet arthropathy.  No impingement. L4-L5: Postsurgical change on the RIGHT. Fluid signal intensity in the L4-5 disc is stable from priors, unlikely to represent infection. Advanced loss of disc space height. Mild facet arthropathy. No stenosis or subarticular zone narrowing. BILATERAL foraminal narrowing, worse on the LEFT, could affect either L4 nerve root. L5-S1: Disc height is preserved. There is advanced facet arthropathy. No stenosis. No significant subarticular zone narrowing. BILATERAL foraminal narrowing, worse on the RIGHT, not clearly compressive. Compared with the most recent prior study, the postoperative fluid collection has resolved. IMPRESSION: Postoperative changes at L4-5 with 3 mm slip but no residual stenosis. BILATERAL foraminal narrowing at this level could affect either L4 nerve root, but is worse on the LEFT. Unremarkable spondylosis at the other levels, stable from priors. Marked bladder distention, etiology unclear. Electronically Signed   By: Staci Righter M.D.   On: 03/31/2016 07:35     Labs:   Basic Metabolic Panel:  Recent Labs Lab 03/30/16 2136 03/30/16 2147 03/31/16 0240 04/01/16 0202  NA 144 145 143 142  K 4.3 4.1 3.6 4.4  CL 111 111 113* 112*  CO2 18*  --  22 26  GLUCOSE 98 100* 110* 86  BUN 19 22* 17 22*  CREATININE 1.75* 1.60* 1.62* 1.34*  CALCIUM 9.8  --  9.3 9.2   GFR Estimated Creatinine Clearance: 58.1 mL/min (by C-G formula based on SCr of 1.34 mg/dL (H)). Liver Function Tests:  Recent  Labs Lab 03/30/16 2136 03/31/16 0240  AST 30 37  ALT 24 24  ALKPHOS 64 58  BILITOT 3.3* 3.7*  PROT 7.9 7.4  ALBUMIN 4.7 4.4   No results for input(s): LIPASE, AMYLASE in the last 168 hours. No results for input(s): AMMONIA in the last 168 hours. Coagulation profile  Recent Labs Lab 03/30/16 2136  INR 1.02    CBC:  Recent Labs Lab 03/30/16 2136 03/30/16 2147 04/01/16 0202  WBC 18.9*  --  9.8  NEUTROABS 15.7*  --   --   HGB 17.1* 17.7* 14.7  HCT 50.8 52.0 46.8  MCV 92.9  --  96.1  PLT 251  --  227   Cardiac Enzymes:  Recent Labs Lab 03/30/16 2136 03/31/16 0240 03/31/16 0355 03/31/16 1529 04/01/16 0202  CKTOTAL 1,021* 1,774*  --   --  1,197*  TROPONINI  --  0.06* 0.05* 0.05*  --    BNP: Invalid input(s): POCBNP CBG:  Recent Labs Lab 03/30/16 2134  GLUCAP 107*   D-Dimer No results for input(s): DDIMER in the last 72 hours. Hgb A1c  Recent Labs  03/31/16 0356  HGBA1C 5.8*   Lipid Profile  Recent Labs  03/31/16 0357 04/01/16 0202  CHOL 164 164  HDL 52 45  LDLCALC 96 99  TRIG 78 101  CHOLHDL 3.2 3.6   Thyroid function studies  Recent Labs  03/31/16 0848  TSH 2.211   Anemia work up No results for input(s): VITAMINB12, FOLATE, FERRITIN, TIBC, IRON, RETICCTPCT in the last 72 hours. Microbiology No results found for this or any previous visit (from the past 240 hour(s)).   Discharge Instructions:   Discharge Instructions    Diet - low sodium heart healthy    Complete by:  As directed    Discharge instructions    Complete by:  As directed    intermittent supervision by family Outpatient event monitor   Increase activity slowly    Complete by:  As directed        Medication List    STOP taking these medications   sildenafil 20 MG tablet Commonly known as:  REVATIO     TAKE these medications   aspirin 81 MG EC tablet Take 1 tablet (81 mg total) by mouth daily.   atorvastatin 40 MG tablet Commonly known as:   LIPITOR Take 1 tablet (40 mg total) by mouth daily at 6 PM.   losartan-hydrochlorothiazide 50-12.5 MG tablet Commonly known as:  HYZAAR Take 1/2 tablet by mouth in the morning         Time coordinating discharge: 35 min  Signed:  Brie Eppard U Makinzi Prieur   [facility] 04/01/2016, 12:53 PM

## 2016-04-01 NOTE — Progress Notes (Signed)
*  Preliminary Results* Bilateral lower extremity venous duplex completed. Bilateral lower extremities are negative for acute deep vein thrombosis. 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 There is no evidence of Baker's cyst bilaterally.  04/01/2016 11:18 AM Maudry Mayhew, BS, RVT, RDCS, RDMS

## 2016-04-01 NOTE — Evaluation (Signed)
Occupational Therapy Evaluation and Discharge Patient Details Name: Timothy Duncan MRN: [id number] DOB: 17-May-1941 Today's Date: 04/01/2016    History of Present Illness 75 y.o.gentleman with a history of HTN and PE (PCP recently stopped warfarin after therapy for one year) admitted after fall with inability to get up. Son found patient and appears he was on the floor overnight. He reported "my equilibrium was off" all weekend and also reports right hip/thigh pain. 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 Significant PMHx- back surgery, DVT   Clinical Impression   This 75 yo male admitted with above presents to acute OT at a Mod I to independent level, no further OT needs identified, we will sign off.    Follow Up Recommendations  No OT follow up    Equipment Recommendations  None recommended by OT       Precautions / Restrictions Precautions Precautions: Fall (due to h/o fall) Restrictions Weight Bearing Restrictions: No      Mobility Bed Mobility Overal bed mobility: Modified Independent             General bed mobility comments: HOB up and use of rail--but performed at normal speed  Transfers Overall transfer level: Modified independent Equipment used: None Transfers: Sit to/from Stand Sit to Stand: Supervision         General transfer comment: Ambulation intially with S then Independently (no AD and realitively normal speed)    Balance Overall balance assessment: History of Falls Sitting-balance support: No upper extremity supported;Feet supported Sitting balance-Leahy Scale: Normal Sitting balance - Comments: No issue with maintaining balance as he doffed and donned socks sitting at EOB today by crossing one leg over the other   Standing balance support: No upper extremity supported Standing balance-Leahy Scale: Good                              ADL Overall ADL's : Modified independent                                       General ADL Comments: Increased time and has to hold onto side of walk in shower when stepping in and out     Vision Vision Assessment?: Yes Eye Alignment: Within Functional Limits Ocular Range of Motion: Within Functional Limits Alignment/Gaze Preference: Within Defined Limits Tracking/Visual Pursuits: Able to track stimulus in all quads without difficulty Saccades: Within functional limits Convergence: Within functional limits Visual Fields: No apparent deficits          Pertinent Vitals/Pain Pain Assessment: No/denies pain     Hand Dominance Right   Extremity/Trunk Assessment Upper Extremity Assessment Upper Extremity Assessment: Overall WFL for tasks assessed           Communication Communication Communication: HOH   Cognition Arousal/Alertness: Awake/alert Behavior During Therapy: WFL for tasks assessed/performed Overall Cognitive Status: Within Functional Limits for tasks assessed Area of Impairment: Following commands;Safety/judgement;Problem solving       Following Commands: Follows one step commands consistently       General Comments: Aware of where he is and that he fell, could not get up, and his son brought him to the hospital              Home Living Family/patient expects  to be discharged to:: Private residence Living Arrangements: Alone Available Help at Discharge: Family;Available PRN/intermittently (son and dtr in law) Type of Home: House Home Access: Stairs to enter [employer] of Steps: 2 Entrance Stairs-Rails: None Home Layout: Two level;Able to live on main level with bedroom/bathroom Alternate Level Stairs-Number of Steps: 1 (between kitchen and living room, 13 as well.) Alternate Level Stairs-Rails: None Bathroom Shower/Tub: Walk-in shower;Door   Bathroom Toilet: Standard (sink beside it)      Home Equipment: Hand held shower head   Additional Comments: Son and daughter-in-law live in [place] - available as needed      Prior Functioning/Environment Level of Independence: Independent        Comments: used RW (borrowed) after back surgery and no longer needed; Drives and does all his IADLs              OT Goals(Current goals can be found in the care plan section) Acute Rehab OT Goals Patient Stated Goal: to go home  OT Frequency:                End of Session Equipment Utilized During Treatment: Gait belt  Activity Tolerance: Patient tolerated treatment well Patient left: in bed;with call bell/phone within reach;with bed alarm set   Time: [id number] OT Time Calculation (min): 20 min Charges:  OT General Charges $OT Visit: 1 Procedure OT Evaluation $OT Eval Moderate Complexity: 1 Procedure  Almon Register [id number] 04/01/2016, 9:14 AM

## 2016-04-01 NOTE — Progress Notes (Signed)
  Echocardiogram 2D Echocardiogram has been performed.  Timothy Duncan 04/01/2016, 11:15 AM

## 2016-04-01 NOTE — Telephone Encounter (Signed)
Please Advise

## 2016-04-02 NOTE — Care Management Note (Signed)
Case Management Note  Patient Details  Name: Timothy Duncan MRN: [id number] Date of Birth: [birth year]  Subjective/Objective:                    Action/Plan: Pt discharging home with self care. Pt states he has transportation home. Recommendations are for rolling walker. CM met with the patient and he is refusing the walker. MD updated. No further needs per CM.   Expected Discharge Date:                  Expected Discharge Plan:  Home/Self Care  In-House Referral:     Discharge planning Services  CM Consult  Post Acute Care Choice:    Choice offered to:     DME Arranged:    DME Agency:     HH Arranged:    [place] Agency:     Status of Service:  Completed, signed off  If discussed at [employer] of Stay Meetings, dates discussed:    Additional Comments:  Pollie Friar, RN 04/02/2016, 8:45 AM

## 2016-04-07 ENCOUNTER — Telehealth: Payer: Self-pay | Admitting: Family Medicine

## 2016-04-07 NOTE — Telephone Encounter (Signed)
Patient wants Dr. Sherren Mocha to call him for a Quick talk about a Rx

## 2016-04-08 NOTE — Telephone Encounter (Signed)
Forwarded to Dr. Sherren Mocha.

## 2016-04-14 ENCOUNTER — Other Ambulatory Visit: Payer: Self-pay | Admitting: Physician Assistant

## 2016-04-14 ENCOUNTER — Other Ambulatory Visit: Payer: Self-pay | Admitting: Family Medicine

## 2016-04-14 DIAGNOSIS — I639 Cerebral infarction, unspecified: Secondary | ICD-10-CM

## 2016-04-14 MED ORDER — SILDENAFIL CITRATE 20 MG PO TABS: 20.0000 mg | ORAL_TABLET | Freq: Every evening | ORAL | 11 refills | Status: DC | PRN

## 2016-04-14 MED ORDER — ASPIRIN 81 MG PO TBEC: 81.0000 mg | DELAYED_RELEASE_TABLET | Freq: Every day | ORAL | Status: DC

## 2016-04-14 NOTE — Progress Notes (Signed)
rev

## 2016-04-15 ENCOUNTER — Telehealth: Payer: Self-pay | Admitting: Physician Assistant

## 2016-04-15 NOTE — Telephone Encounter (Signed)
Called to speak with pt about scheduling the event monitor he advised he would not be doing that and hung up the phone. I have removed the order from the workque.   Davy Pique

## 2016-04-15 NOTE — Telephone Encounter (Signed)
Patient calling requesting a prescription for Viagra. Right ear O generic Viagra 20 mg prescription given

## 2016-04-25 ENCOUNTER — Emergency Department (HOSPITAL_COMMUNITY): Payer: Medicare HMO

## 2016-04-25 ENCOUNTER — Encounter (HOSPITAL_COMMUNITY): Payer: Self-pay | Admitting: Emergency Medicine

## 2016-04-25 ENCOUNTER — Emergency Department (HOSPITAL_COMMUNITY)
Admission: EM | Admit: 2016-04-25 | Discharge: 2016-04-25 | Disposition: A | Discharge status: Home / Self Care | Payer: Medicare HMO | Attending: Emergency Medicine | Admitting: Emergency Medicine

## 2016-04-25 DIAGNOSIS — R51 Headache: Secondary | ICD-10-CM | POA: Diagnosis not present

## 2016-04-25 DIAGNOSIS — M545 Low back pain, unspecified: Secondary | ICD-10-CM

## 2016-04-25 DIAGNOSIS — R531 Weakness: Secondary | ICD-10-CM | POA: Insufficient documentation

## 2016-04-25 DIAGNOSIS — Z8673 Personal history of transient ischemic attack (TIA), and cerebral infarction without residual deficits: Secondary | ICD-10-CM | POA: Diagnosis not present

## 2016-04-25 DIAGNOSIS — I10 Essential (primary) hypertension: Secondary | ICD-10-CM | POA: Diagnosis not present

## 2016-04-25 DIAGNOSIS — Z5181 Encounter for therapeutic drug level monitoring: Secondary | ICD-10-CM | POA: Insufficient documentation

## 2016-04-25 DIAGNOSIS — G8929 Other chronic pain: Secondary | ICD-10-CM | POA: Diagnosis not present

## 2016-04-25 DIAGNOSIS — R410 Disorientation, unspecified: Secondary | ICD-10-CM | POA: Diagnosis not present

## 2016-04-25 DIAGNOSIS — Z87891 Personal history of nicotine dependence: Secondary | ICD-10-CM | POA: Insufficient documentation

## 2016-04-25 DIAGNOSIS — Z7982 Long term (current) use of aspirin: Secondary | ICD-10-CM | POA: Insufficient documentation

## 2016-04-25 DIAGNOSIS — D72829 Elevated white blood cell count, unspecified: Secondary | ICD-10-CM | POA: Diagnosis not present

## 2016-04-25 HISTORY — DX: Transient cerebral ischemic attack, unspecified: G45.9

## 2016-04-25 LAB — PROTIME-INR
INR: 1
Prothrombin Time: 13.2 seconds (ref 11.4–15.2)

## 2016-04-25 LAB — DIFFERENTIAL
Basophils Absolute: 0 10*3/uL (ref 0.0–0.1)
Basophils Relative: 0 %
EOS ABS: 0 10*3/uL (ref 0.0–0.7)
EOS PCT: 0 %
LYMPHS ABS: 2.1 10*3/uL (ref 0.7–4.0)
LYMPHS PCT: 15 %
MONO ABS: 1.1 10*3/uL — AB (ref 0.1–1.0)
MONOS PCT: 7 %
Neutro Abs: 11.3 10*3/uL — ABNORMAL HIGH (ref 1.7–7.7)
Neutrophils Relative %: 78 %

## 2016-04-25 LAB — URINALYSIS, ROUTINE W REFLEX MICROSCOPIC
Bilirubin Urine: NEGATIVE
Glucose, UA: NEGATIVE mg/dL
Hgb urine dipstick: NEGATIVE
KETONES UR: NEGATIVE mg/dL
LEUKOCYTES UA: NEGATIVE
NITRITE: NEGATIVE
PH: 6 (ref 5.0–8.0)
PROTEIN: NEGATIVE mg/dL
Specific Gravity, Urine: 1.016 (ref 1.005–1.030)

## 2016-04-25 LAB — CBC
HEMATOCRIT: 49.4 % (ref 39.0–52.0)
HEMOGLOBIN: 17 g/dL (ref 13.0–17.0)
MCH: 31.4 pg (ref 26.0–34.0)
MCHC: 34.4 g/dL (ref 30.0–36.0)
MCV: 91.1 fL (ref 78.0–100.0)
Platelets: 248 10*3/uL (ref 150–400)
RBC: 5.42 MIL/uL (ref 4.22–5.81)
RDW: 14.2 % (ref 11.5–15.5)
WBC: 14.5 10*3/uL — ABNORMAL HIGH (ref 4.0–10.5)

## 2016-04-25 LAB — COMPREHENSIVE METABOLIC PANEL
ALBUMIN: 4.5 g/dL (ref 3.5–5.0)
ALK PHOS: 60 U/L (ref 38–126)
ALT: 28 U/L (ref 17–63)
ANION GAP: 11 (ref 5–15)
AST: 25 U/L (ref 15–41)
BILIRUBIN TOTAL: 2.4 mg/dL — AB (ref 0.3–1.2)
BUN: 15 mg/dL (ref 6–20)
CALCIUM: 9.8 mg/dL (ref 8.9–10.3)
CO2: 21 mmol/L — ABNORMAL LOW (ref 22–32)
Chloride: 111 mmol/L (ref 101–111)
Creatinine, Ser: 1.37 mg/dL — ABNORMAL HIGH (ref 0.61–1.24)
GFR calc Af Amer: 57 mL/min — ABNORMAL LOW (ref >60)
GFR, EST NON AFRICAN AMERICAN: 49 mL/min — AB (ref >60)
GLUCOSE: 106 mg/dL — AB (ref 65–99)
Potassium: 4 mmol/L (ref 3.5–5.1)
Sodium: 143 mmol/L (ref 135–145)
TOTAL PROTEIN: 7.5 g/dL (ref 6.5–8.1)

## 2016-04-25 LAB — I-STAT CHEM 8, ED
BUN: 18 mg/dL (ref 6–20)
CALCIUM ION: 1.14 mmol/L — AB (ref 1.15–1.40)
CHLORIDE: 110 mmol/L (ref 101–111)
Creatinine, Ser: 1.3 mg/dL — ABNORMAL HIGH (ref 0.61–1.24)
GLUCOSE: 102 mg/dL — AB (ref 65–99)
HCT: 51 % (ref 39.0–52.0)
Hemoglobin: 17.3 g/dL — ABNORMAL HIGH (ref 13.0–17.0)
Potassium: 4 mmol/L (ref 3.5–5.1)
SODIUM: 145 mmol/L (ref 135–145)
TCO2: 22 mmol/L (ref 0–100)

## 2016-04-25 LAB — RAPID URINE DRUG SCREEN, HOSP PERFORMED
Amphetamines: NOT DETECTED
BARBITURATES: NOT DETECTED
Benzodiazepines: NOT DETECTED
Cocaine: NOT DETECTED
OPIATES: NOT DETECTED
TETRAHYDROCANNABINOL: NOT DETECTED

## 2016-04-25 LAB — I-STAT TROPONIN, ED: TROPONIN I, POC: 0.01 ng/mL (ref 0.00–0.08)

## 2016-04-25 LAB — APTT: aPTT: 30 seconds (ref 24–36)

## 2016-04-25 MED ORDER — SODIUM CHLORIDE 0.9 % IV BOLUS (SEPSIS)
1000.0000 mL | Freq: Once | INTRAVENOUS | Status: AC
  Administered 2016-04-25: 1000 mL via INTRAVENOUS

## 2016-04-25 NOTE — ED Provider Notes (Signed)
[facility] DEPT Provider Note   CSN: [id number] Arrival date & time: 04/25/16  1013     History   Chief Complaint Chief Complaint  Patient presents with  . Back Pain  . Stroke Symptoms    HPI Timothy Duncan is a 75 y.o. male.  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 Patient states he fell asleep on couch last night around 9:30 PM, and woke up this morning with back pain and RLE weakness. He had difficult time getting up from couch, with right leg "weakness," but no difficulty ambulating last evening. Denies any radicular pain or paresthesias. Daughter-in-law states he also had mild "slurred speech" and appeared confused, stating he spoke with her husband at home this morning, when in fact he was away on business trip. Patient denies any headache, dizziness, vision changes, LOC, chest pain, shortness of breath, abdominal pain, pain or swelling in lower extremities, or dysuria. Denies any alcohol or drug use. Former heavy smoker (3.5 PPD) but quit smoking 35 years ago.      Past Medical History:  Diagnosis Date  . DVT (deep venous thrombosis) ([place]) 07/2015   RLE  . ED (erectile dysfunction)   . Hypertension   . TIA (transient ischemic attack)     Patient Active Problem List   Diagnosis Date Noted  . AKI (acute kidney injury) ([place]) 03/31/2016  . Delirium 03/31/2016  . Right leg weakness 03/31/2016  . Alcohol dependence ([place]) 03/31/2016  . Acute CVA (cerebrovascular accident) ([place]) 03/31/2016  . Cerebrovascular accident (CVA) ([place])   . Post op infection 07/31/2015  . Encounter for therapeutic drug monitoring 07/13/2015  . DVT (deep  venous thrombosis) ([place]) 07/11/2015  . S/P lumbar laminectomy 05/03/2015  . Actinic keratosis 02/04/2011  . BACK PAIN 09/03/2009  . ERECTILE DYSFUNCTION 03/24/2008  . Essential hypertension 03/24/2008  . FATIGUE 03/24/2008    Past Surgical History:  Procedure Laterality Date  . APPENDECTOMY    . BACK SURGERY    . CATARACT EXTRACTION W/ INTRAOCULAR LENS  IMPLANT, BILATERAL Bilateral   . COLONOSCOPY    . LUMBAR LAMINECTOMY/DECOMPRESSION MICRODISCECTOMY Right 05/03/2015   Procedure: Microdiscectomy - Lumbar four-Lumbar five - right ;  Surgeon: Eustace Moore, MD;  Location: [facility];  Service: Neurosurgery;  Laterality: Right;  . TONSILLECTOMY    . WOUND EXPLORATION N/A 06/06/2015   Procedure: Presumed discitis, Lumbar exploration,Repeat discectomy Lumbar four-five with deep tissue culture;  Surgeon: Eustace Moore, MD;  Location: [facility];  Service: Neurosurgery;  Laterality: N/A;       Home Medications    Prior to Admission medications   Medication Sig Start Date End Date Taking? Authorizing Provider  aspirin 81 MG EC tablet Take 1 tablet (81 mg total) by mouth daily. 04/14/16   Dorena Cookey, MD  atorvastatin (LIPITOR) 40 MG tablet Take 1 tablet (40 mg total) by mouth daily at 6 PM. 04/01/16   Geradine Girt, DO  losartan-hydrochlorothiazide [facility]) 50-12.5 MG tablet Take 1/2 tablet by mouth in the morning 03/19/16   Dorena Cookey, MD  sildenafil (REVATIO) 20 MG tablet Take 1 tablet (20 mg total) by mouth at  bedtime as needed. 04/14/16   Dorena Cookey, MD    Family History History reviewed. No pertinent family history.  Social History Social History  Substance Use Topics  . Smoking status: Former Smoker    Packs/day: 3.00    Years: 30.00    Types: Cigarettes  . Smokeless tobacco: Former Systems developer    Types: Snuff, Chew     Comment: "quit smoking in the 1980s; used chew and snuff off and on, none for awhile"  . Alcohol use No     Comment: 03/31/2016 "no drinking since  06/22/2005"     Allergies   Review of patient's allergies indicates no known allergies.   Review of Systems Review of Systems  Constitutional: Negative for chills and fever.  HENT: Negative for ear pain and sore throat.   Eyes: Negative for pain and visual disturbance.  Respiratory: Negative for cough and shortness of breath.   Cardiovascular: Negative for chest pain, palpitations and leg swelling.  Gastrointestinal: Negative for abdominal pain, blood in stool, nausea and vomiting.  Genitourinary: Negative for dysuria, flank pain and hematuria.  Musculoskeletal: Positive for back pain and gait problem. Negative for neck pain and neck stiffness.  Skin: Negative for color change and rash.  Neurological: Positive for weakness. Negative for dizziness, seizures, syncope, numbness and headaches.     Physical Exam Updated Vital Signs Ht 5\' 11"  (1.803 m)   Wt 102.1 kg   BMI 31.38 kg/m   Physical Exam  Constitutional: He appears well-developed and well-nourished. No distress.  HENT:  Head: Normocephalic and atraumatic.  Oropharynx clear, but mucous membranes dry   Eyes: Conjunctivae and EOM are normal. Pupils are equal, round, and reactive to light.  Neck: Normal range of motion. Neck supple.  No midline cervical tenderness, crepitus, or deformity. No TTP of paraspinal or trapezius musculature. No nuchal rigidity or meningeal signs.  Cardiovascular: Normal rate, regular rhythm, normal heart sounds and intact distal pulses.   Strong DP pulses bilaterally.  Pulmonary/Chest: Effort normal and breath sounds normal. No respiratory distress.  Abdominal: Soft. Bowel sounds are normal. There is no tenderness.  Musculoskeletal: Normal range of motion. He exhibits no edema or tenderness.  T-spine and L-spine with FROM without spinous process TTP. No bony stepoffs, deformities, paraspinous muscle TTP, or muscle spasms noted. Strength 5/5 in all extremities. Negative SLR bilaterally. Distal  pulses intact.  Neurological: He is alert. He has normal reflexes.  Oriented to person and place, but not time (stating today was Sunday). Speech is clear and goal oriented, follows commands. Cranial nerves III - XII without deficit, no facial droop. Normal strength in upper and lower extremities bilaterally, strong and equal grip strength. Slightly diminished sensation to light and sharp touch in RLE, but equal in all other extremities. Moves extremities without ataxia, coordination intact. Normal finger to nose and rapid alternating movements. No pronator drift. Romberg and gait not assessed as patient had difficulty standing due to weakness in right leg.  Skin: Skin is warm and dry.  Psychiatric: He has a normal mood and affect.  Nursing note and vitals reviewed.    ED Treatments / Results  Labs (all labs ordered are listed, but only abnormal results are displayed) Labs Reviewed - No data to display  EKG  EKG Interpretation None       Radiology No results found.  Procedures Procedures (including critical care time)  Medications Ordered in ED Medications - No data to display   Initial Impression / Assessment and Plan /  ED Course  I have reviewed the triage vital signs and the nursing notes.  Pertinent labs & imaging results that were available during my care of the patient were reviewed by me and considered in my medical decision making (see chart for details).  Clinical Course   Patient is 75 yo M with PMH of TIA, presenting from home with daughter-in-law, after episode of confusion and RLE weakness this morning. Patient was at normal baseline mental status yesterday, and had no difficulty ambulating before he fell asleep at 9:30 PM last night. Patient has no cranial nerve or strength deficits, but oriented to person and place only, and unable to assess Romberg or gait due to difficulty standing. Patient states back pain resolved, and unlikely cause for RLE weakness.  Given that patient has history of TIAs, but outside window for TPA, stroke workup ordered per discussion with attending physician, Dr. Clance Boll. However, no Code Stroke called. CT head shows no evidence of infarct or acute intracranial abnormality. EKG sinus rhythm with no significant changes from last tracing. CMP unremarkable from baseline. CBC shows mild leukocytosis of 14.5, but CXR and urinalysis both negative, and patient is afebrile. UDS negative. Discussed findings with patient and recommendation for MRI today, but patient adamant to be d/c home immediately, and started removing cardiac monitoring. Patient ambulated with steady gait, albeit slower than baseline according to daughter-in-law, but had sound decision making capacity and stable for d/c home. Advised to f/u with PCP in 1 day for reevaluation of confusion and weakness, or return to ED for symptoms including confusion, numbness, weakness, slurred speech, or difficulty walking.  Final Clinical Impressions(s) / ED Diagnoses   Final diagnoses:  Weakness  Confusion  Chronic low back pain without sciatica, unspecified back pain laterality    New Prescriptions New Prescriptions   No medications on file     Rosilyn Mings II, [place] 04/25/16 1457    [name], MD 04/25/16 1654

## 2016-04-25 NOTE — Discharge Instructions (Signed)
Your CT scan and workup today shows no evidence of stroke or acute pathology causing your weakness and confusion this morning. However, please call your PCP for revaluation in 2 days, or return to ED for symptoms including numbness, weakness, slurred speech, or difficulty walking.

## 2016-04-25 NOTE — ED Triage Notes (Signed)
Pt states he is here bc he slept on the couch and woke up with back pain. Pt's dtr in law present and reports that pt was supposed to be at work this morning and he did not show up for work. She reports he was confused when she arrived at his house. Pt told her he had just talked with his son (who is out of town.) Pt's balance is off today when ambulating. Pt's dtr in law reports pt had recent TIAs.

## 2016-04-25 NOTE — ED Notes (Signed)
Pt refused to let this EMT get V/S before patient was discharged.

## 2016-04-25 NOTE — ED Provider Notes (Signed)
Medical screening examination/treatment/procedure(s) were conducted as a shared visit with non-physician practitioner(s) and myself.  I personally evaluated the patient during the encounter.  Here with some confusion, and decreased strength in lower extremity this morning. Maybe slurred speech as well. No problem now. Normal neuro exam, normal strength. Can walk with slow steady shuffling gait. Workup negative. Doubt cva/infection. Considered further workup and imaging but patient adamant he wanted to go home so was discharged to follow up with PCP but knows to return here for worsening symptoms.    EKG Interpretation  Date/Time:  Friday April 25 2016 10:40:55 EDT Ventricular Rate:  95 PR Interval:    QRS Duration: 163 QT Interval:  426 QTC Calculation: 536 R Axis:   -59 Text Interpretation:  Sinus rhythm Left bundle branch block No significant change since last tracing Confirmed by [facility] MD, Corene Cornea [phone]) on 04/25/2016 10:44:00 AM Also confirmed by [facility] MD, Porsche Noguchi [phone]), editor Rolla Plate, Joelene Millin [phone])  on 04/25/2016 11:11:50 AM         [name], MD 04/25/16 1657

## 2016-04-25 NOTE — ED Notes (Signed)
Pt attempting to provide urine sample

## 2016-04-25 NOTE — ED Notes (Signed)
Pt's sats while ambulating 94%. Pt ambulated with standby assistance, gait fairly steady, somewhat "wobbly" when pt first started walking.

## 2016-04-25 NOTE — ED Notes (Addendum)
Pt dressed and removed IV stating he was leaving.  Notified Dr Dayna Barker who is now at bedside speaking with pt.  Pt refused to be hooked back up to the monitor at this time.

## 2016-04-28 ENCOUNTER — Ambulatory Visit: Payer: Medicare HMO

## 2016-05-12 ENCOUNTER — Ambulatory Visit: Payer: Medicare HMO | Admitting: Nurse Practitioner

## 2016-07-18 ENCOUNTER — Ambulatory Visit: Payer: Self-pay | Admitting: General Practice

## 2017-01-15 DIAGNOSIS — H538 Other visual disturbances: Secondary | ICD-10-CM | POA: Diagnosis not present

## 2017-01-15 DIAGNOSIS — H524 Presbyopia: Secondary | ICD-10-CM | POA: Diagnosis not present

## 2017-01-15 DIAGNOSIS — Z961 Presence of intraocular lens: Secondary | ICD-10-CM | POA: Diagnosis not present

## 2017-01-15 DIAGNOSIS — H5213 Myopia, bilateral: Secondary | ICD-10-CM | POA: Diagnosis not present

## 2017-01-15 DIAGNOSIS — H353131 Nonexudative age-related macular degeneration, bilateral, early dry stage: Secondary | ICD-10-CM | POA: Diagnosis not present

## 2017-01-28 IMAGING — DX DG CHEST 2V
2 series · 2 of 2 positions shown · non-contrast
Comparison: 11/21/2014 chest radiograph.

CLINICAL DATA: 75 y/o  M; confusion and falling.

EXAM:
CHEST  2 VIEW

[chest ap]
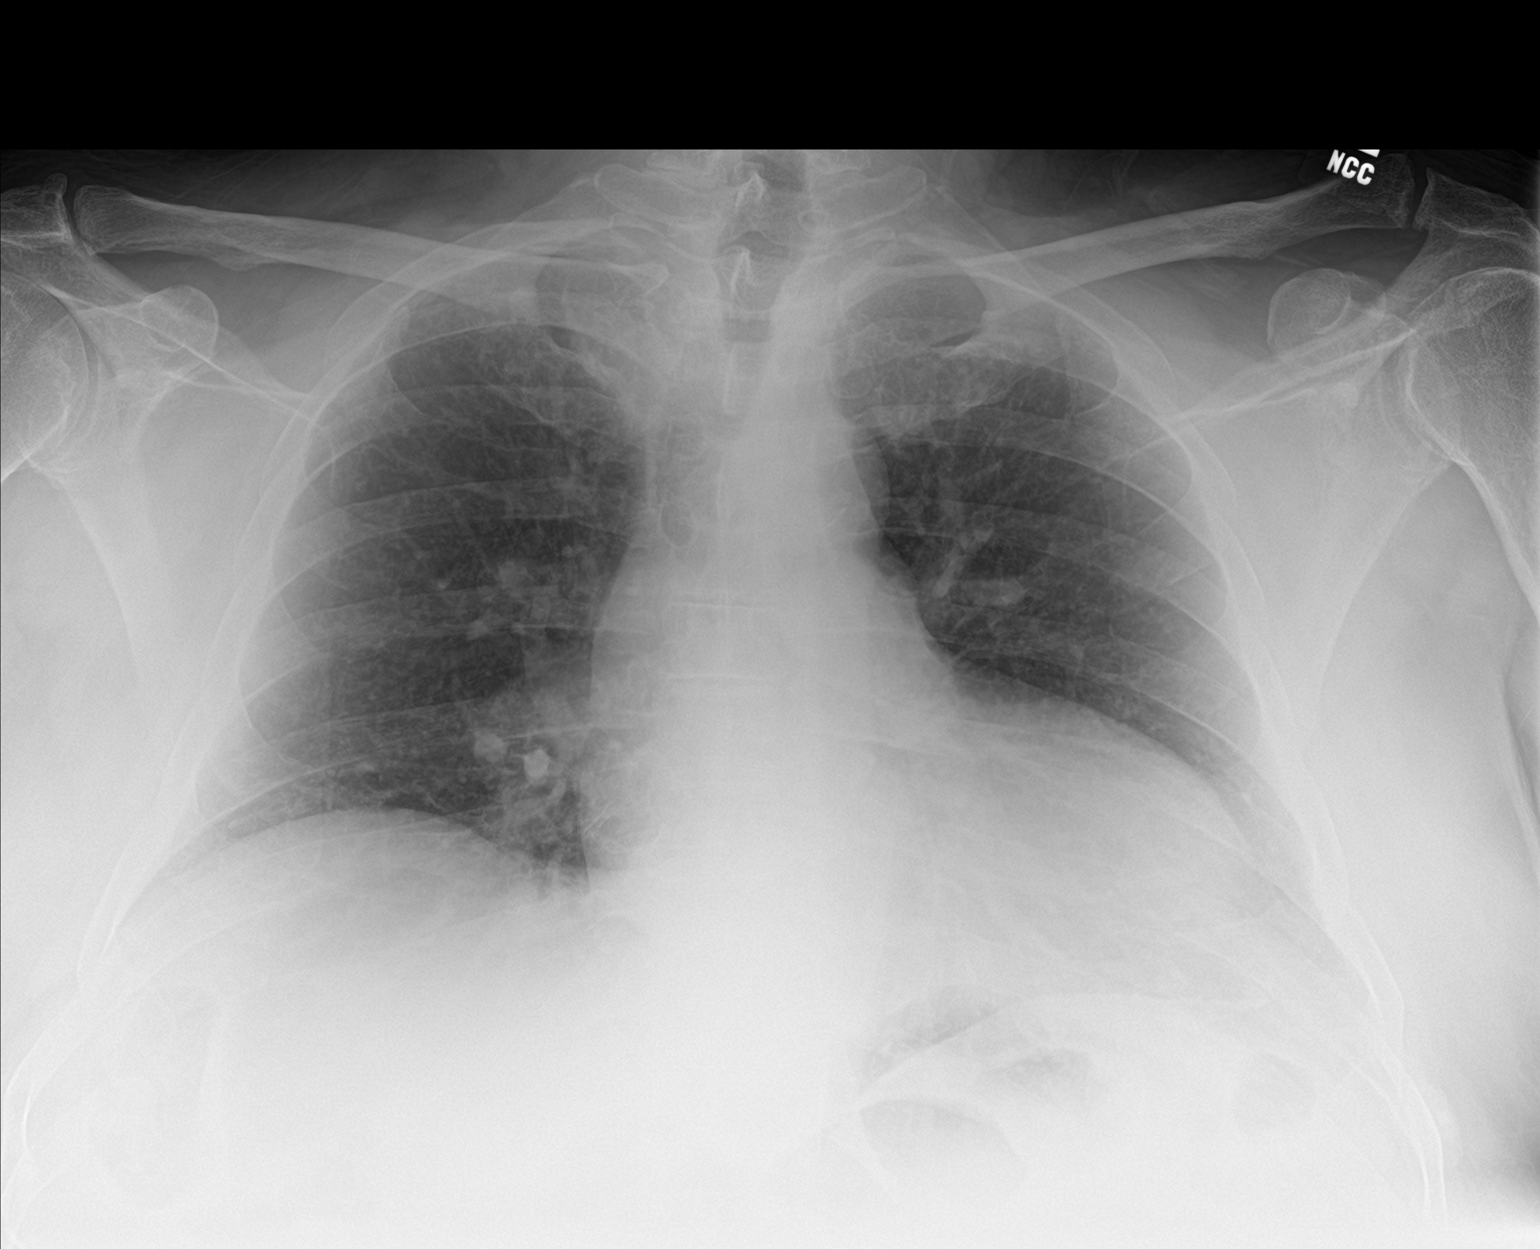

[chest lat]
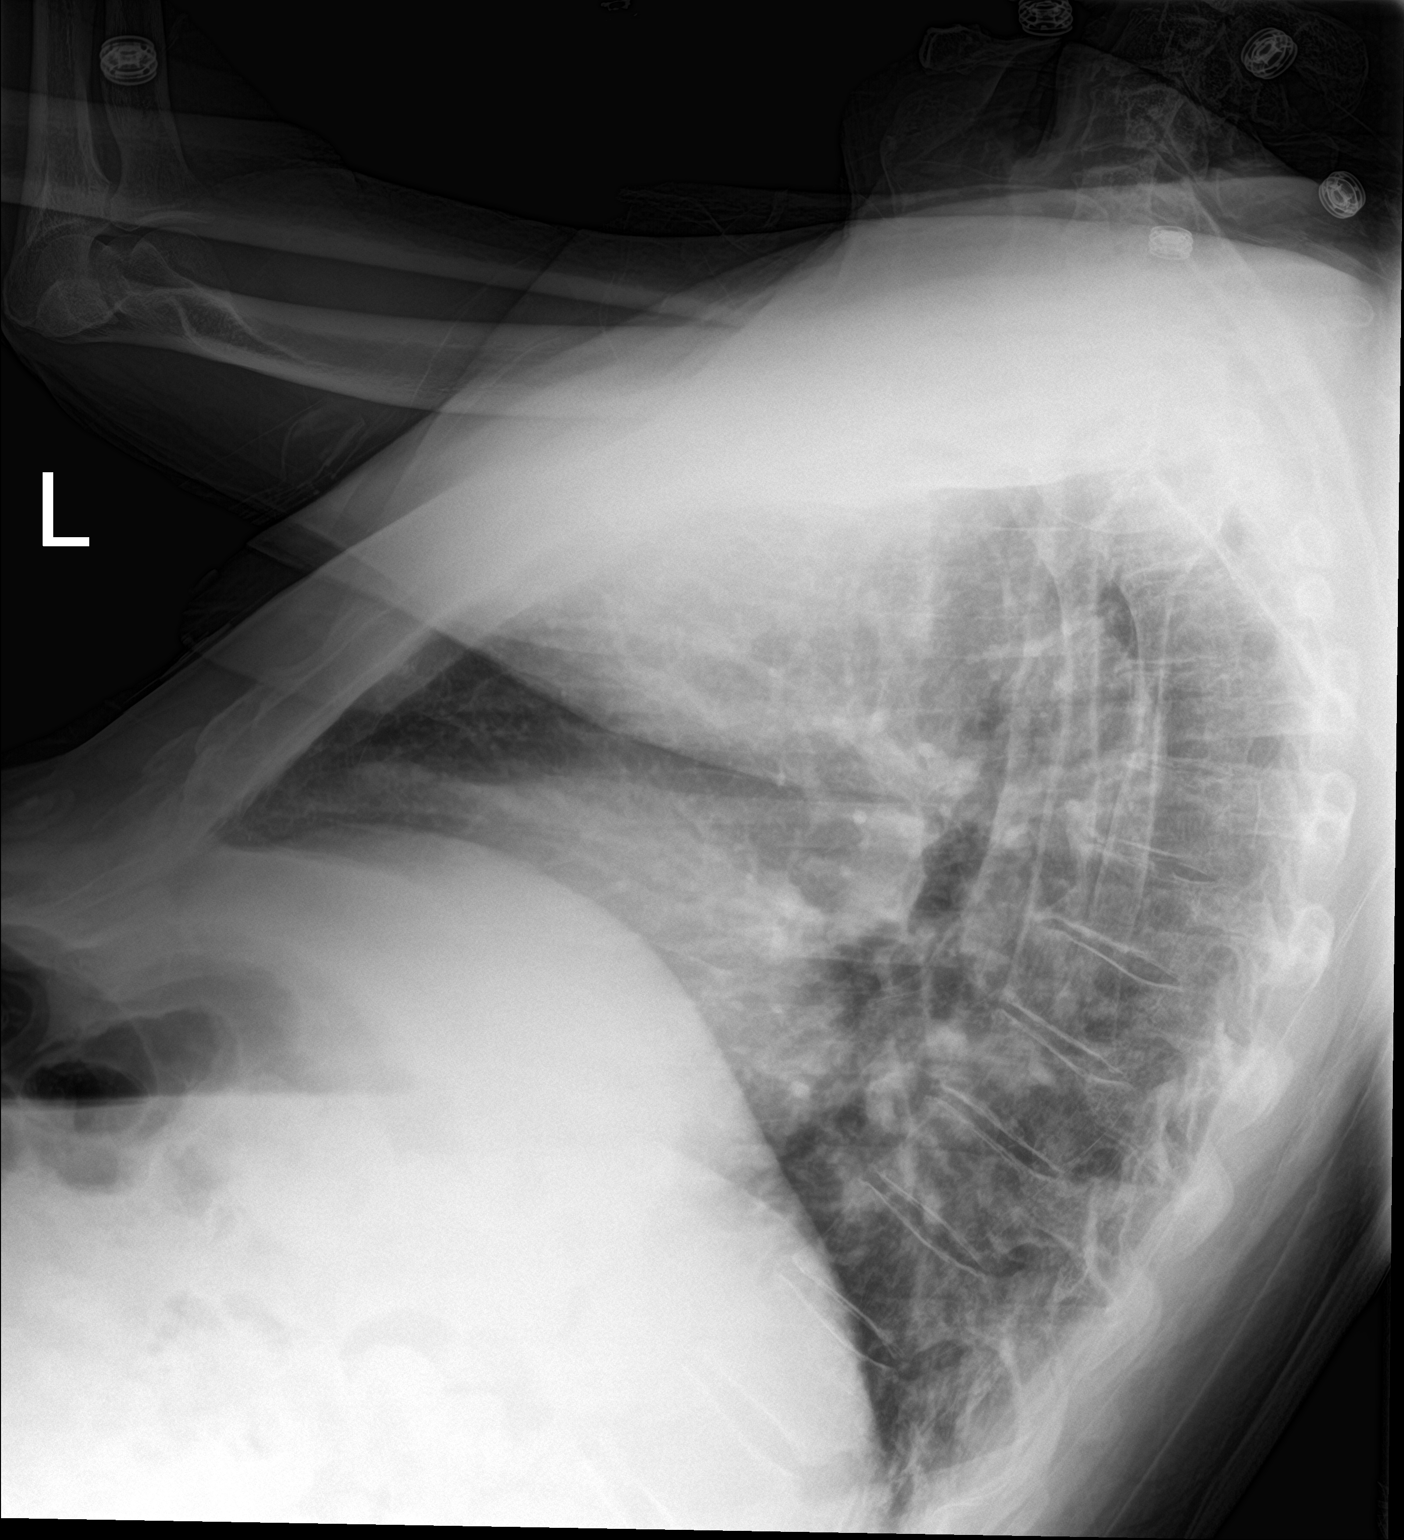

[2 of 2 positions shown; findings below may reference images not displayed]

FINDINGS: Stable enlarged cardiac silhouette. Prominent epicardial fat pad.
Low lung volumes. No focal consolidation. No pleural effusion or
pneumothorax. No acute osseous abnormality is evident.
IMPRESSION: No active cardiopulmonary disease.

By: Hoshimi Guardamino Morales M.D.

## 2017-02-23 IMAGING — CT CT HEAD W/O CM
3 of 4 series · 17 of 47 positions shown, 20 images · non-contrast
Comparison: MRI 03/31/2016, CT head 12/29/2015

CLINICAL DATA: AMS. Pt denies headache or head complaints. Pt woke
up confused and didn't show up to work this AM.

EXAM:
CT HEAD WITHOUT CONTRAST
TECHNIQUE: Contiguous axial images were obtained from the base of the skull
through the vertex without intravenous contrast.

[Series 201: head w/o, idose (1) · axial · non-contrast · 0.47mm/px · z∈[+90,+220]mm · 11 of 32 slices shown, 14 images]
[im 3/32  brain]
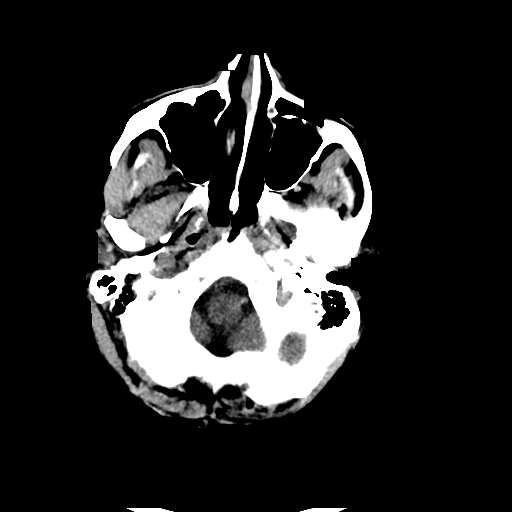
[im 3/32  bone]
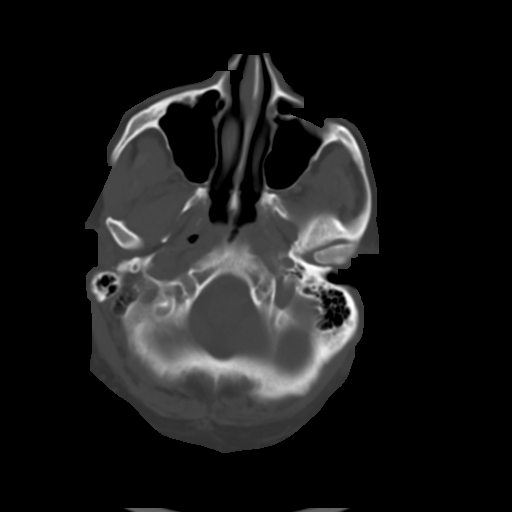
[im 5/32  brain]
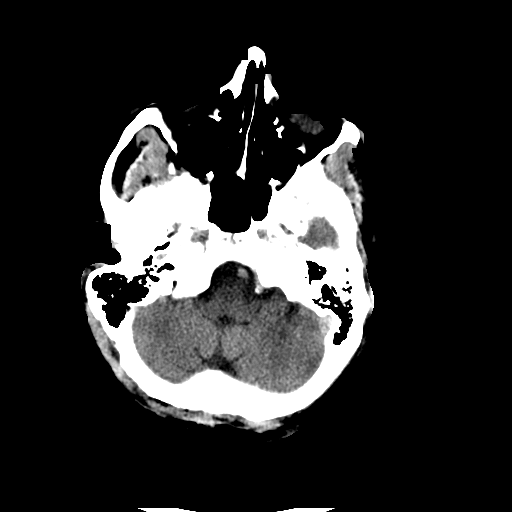
[im 7/32  brain]
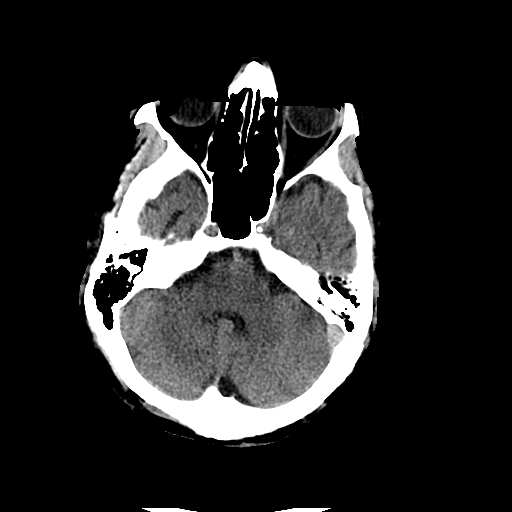
[im 12/32  brain]
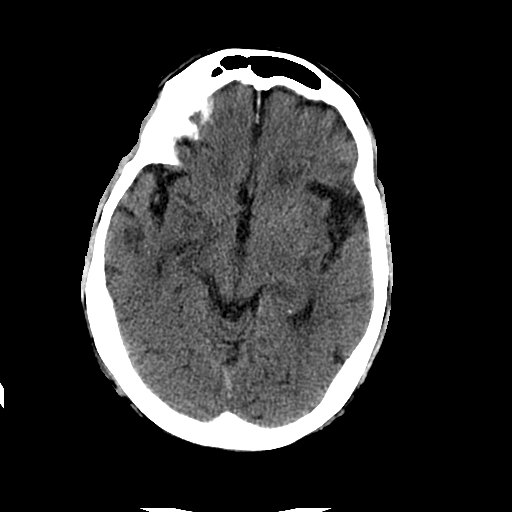
[im 14/32  brain]
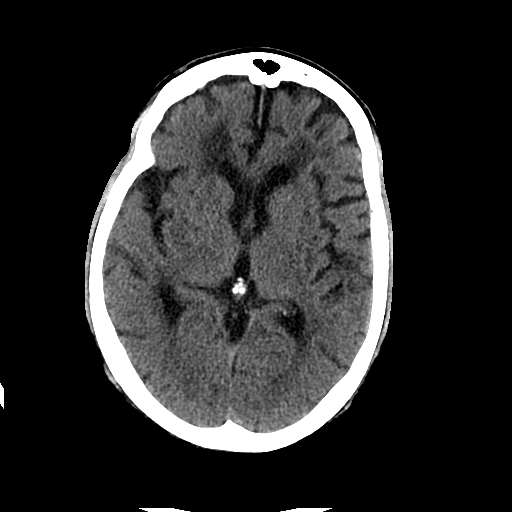
[im 14/32  bone]
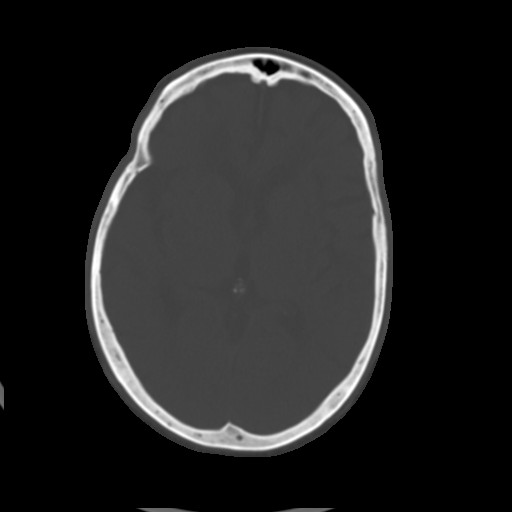
[im 16/32  brain]
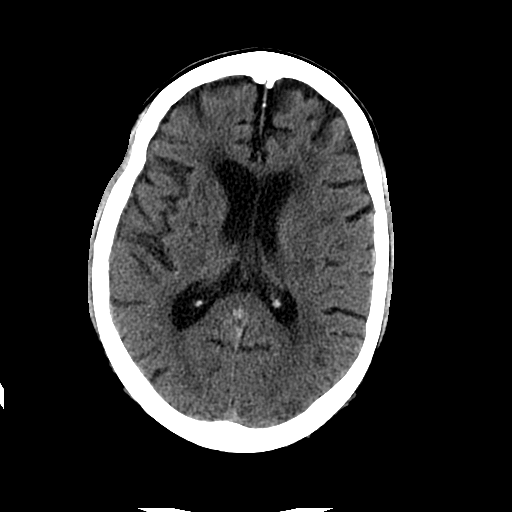
[im 18/32  brain]
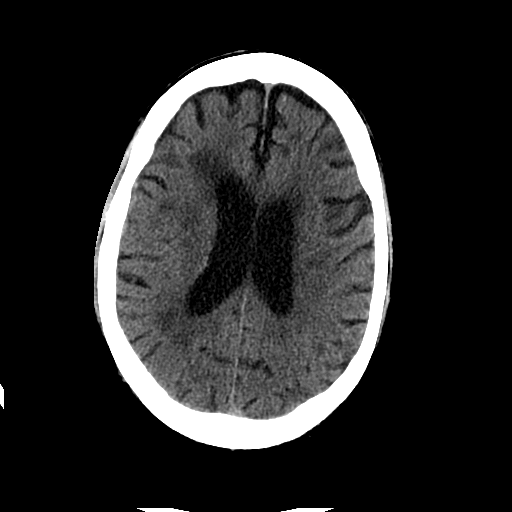
[im 20/32  brain]
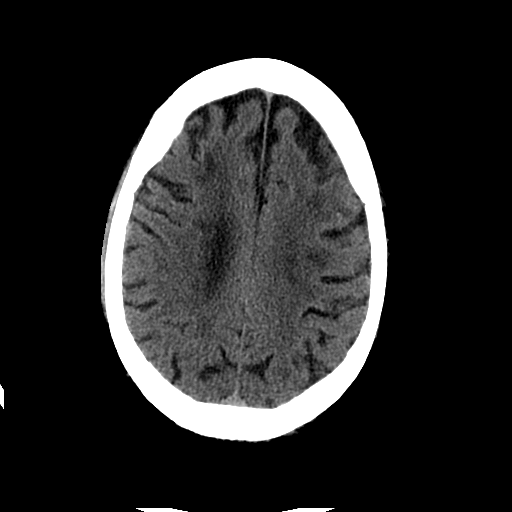
[im 25/32  brain]
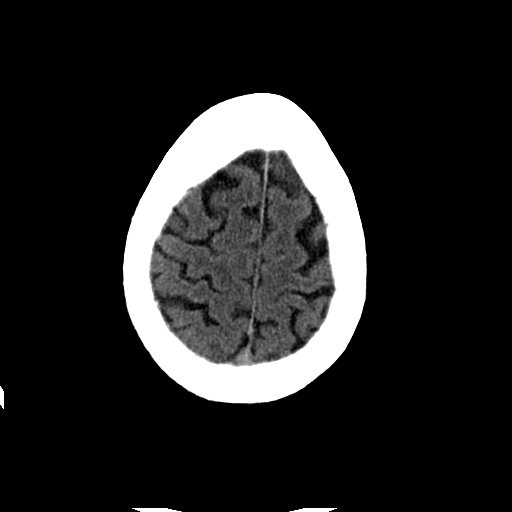
[im 25/32  bone]
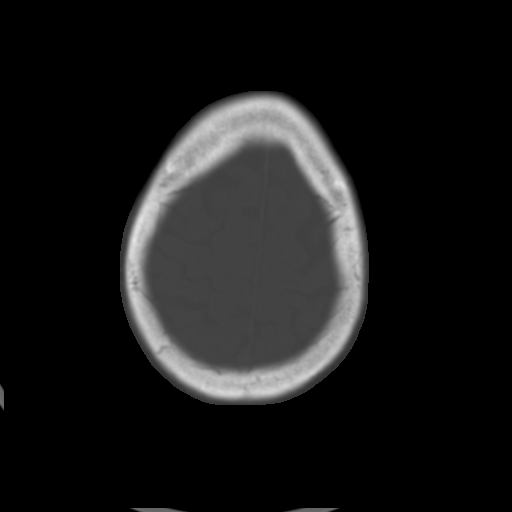
[im 27/32  brain]
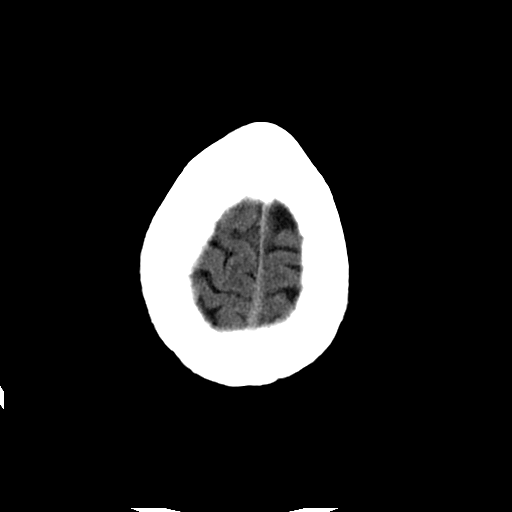
[im 29/32  brain]
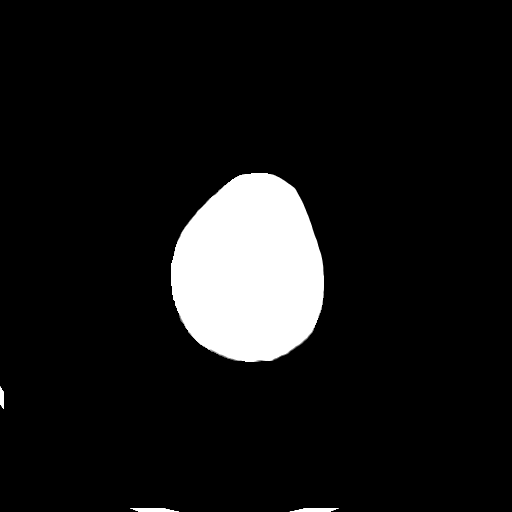

[Series 203: coronal st, idose (1) · coronal · 0.40mm/px · 3 of 78 slices shown]
[im 26/78  brain]
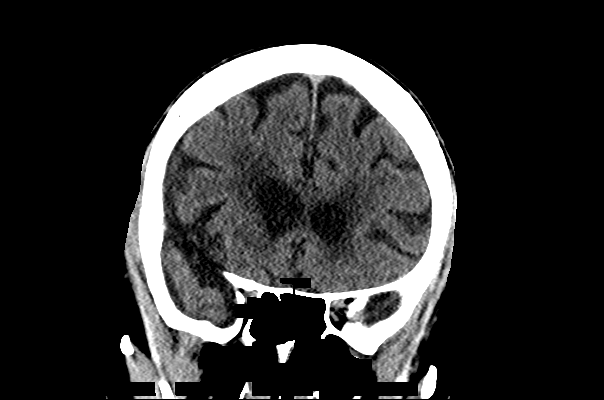
[im 35/78  brain]
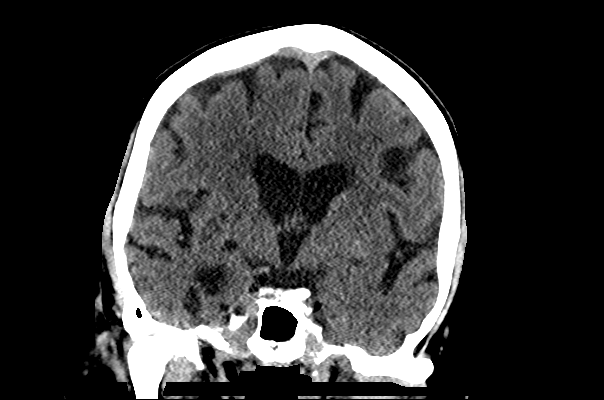
[im 43/78  brain]
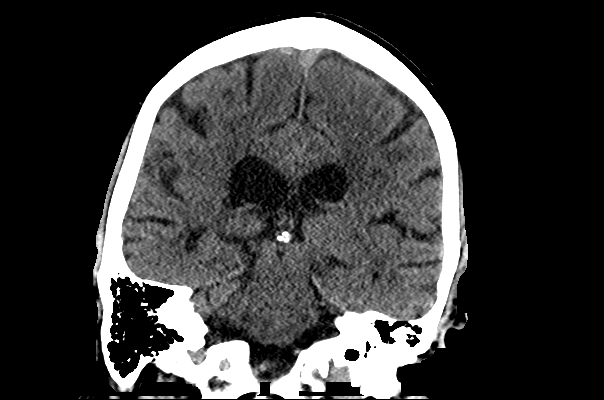

[Series 204: sagittal st, idose (1) · sagittal · 0.40mm/px · 3 of 80 slices shown]
[im 27/80  brain]
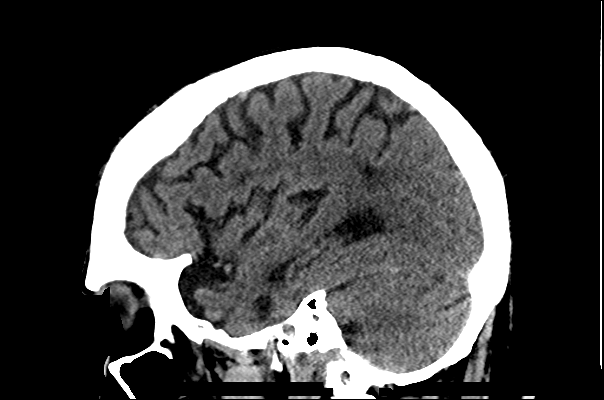
[im 40/80  brain]
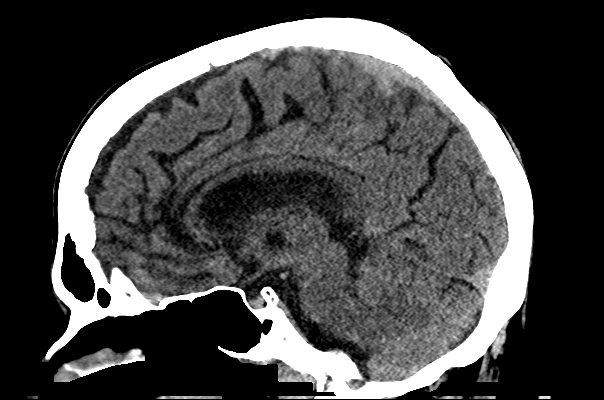
[im 53/80  brain]
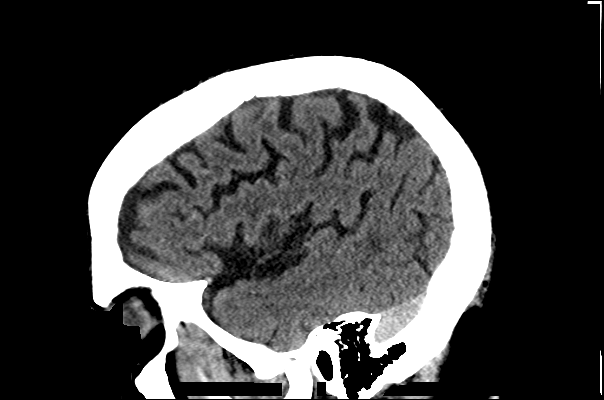

[17 of 47 positions shown; findings below may reference images not displayed]

FINDINGS: Brain: There is central and cortical atrophy. Periventricular white
matter changes are consistent with small vessel disease. There is no
intra or extra-axial fluid collection or mass lesion. The basilar
cisterns and ventricles have a normal appearance. There is no CT
evidence for acute infarction or hemorrhage. Remote lacunar infarct
or prominent Virchow-Robin space is identified within the right
basal ganglia.

Vascular: There is atherosclerotic calcification of the internal
carotid arteries.

Skull: Normal. Negative for fracture or focal lesion.

Sinuses/Orbits: No acute finding.

Other: None
IMPRESSION: 1. Atrophy and small vessel disease.
2.  No evidence for acute intracranial abnormality.

## 2017-03-20 ENCOUNTER — Encounter: Payer: Self-pay | Admitting: Family Medicine

## 2017-04-13 ENCOUNTER — Encounter: Payer: Medicare HMO | Admitting: Family Medicine

## 2017-04-13 ENCOUNTER — Telehealth: Payer: Self-pay

## 2017-04-13 NOTE — Telephone Encounter (Signed)
Called pt, stated that he had called the office to reschedule the app, pt CPE was rescheduled to 04/28/2017.

## 2017-04-28 ENCOUNTER — Encounter: Payer: Self-pay | Admitting: Family Medicine

## 2017-04-28 ENCOUNTER — Ambulatory Visit (INDEPENDENT_AMBULATORY_CARE_PROVIDER_SITE_OTHER): Payer: Medicare HMO | Admitting: Family Medicine

## 2017-04-28 VITALS — BP 136/84 | HR 76 | Temp 98.3°F | Ht 71.0 in | Wt 254.0 lb

## 2017-04-28 DIAGNOSIS — I10 Essential (primary) hypertension: Secondary | ICD-10-CM

## 2017-04-28 DIAGNOSIS — I639 Cerebral infarction, unspecified: Secondary | ICD-10-CM | POA: Diagnosis not present

## 2017-04-28 DIAGNOSIS — E7889 Other lipoprotein metabolism disorders: Secondary | ICD-10-CM | POA: Diagnosis not present

## 2017-04-28 DIAGNOSIS — Z23 Encounter for immunization: Secondary | ICD-10-CM | POA: Diagnosis not present

## 2017-04-28 DIAGNOSIS — Z0000 Encounter for general adult medical examination without abnormal findings: Secondary | ICD-10-CM

## 2017-04-28 DIAGNOSIS — F528 Other sexual dysfunction not due to a substance or known physiological condition: Secondary | ICD-10-CM | POA: Diagnosis not present

## 2017-04-28 DIAGNOSIS — R69 Illness, unspecified: Secondary | ICD-10-CM | POA: Diagnosis not present

## 2017-04-28 LAB — HEPATIC FUNCTION PANEL
ALK PHOS: 59 U/L (ref 39–117)
ALT: 20 U/L (ref 0–53)
AST: 17 U/L (ref 0–37)
Albumin: 4.3 g/dL (ref 3.5–5.2)
BILIRUBIN DIRECT: 0.2 mg/dL (ref 0.0–0.3)
BILIRUBIN TOTAL: 1.4 mg/dL — AB (ref 0.2–1.2)
Total Protein: 6.8 g/dL (ref 6.0–8.3)

## 2017-04-28 LAB — LIPID PANEL
Cholesterol: 171 mg/dL (ref 0–200)
HDL: 48 mg/dL (ref >39.00)
LDL CALC: 89 mg/dL (ref 0–99)
NONHDL: 122.66
Total CHOL/HDL Ratio: 4
Triglycerides: 168 mg/dL — ABNORMAL HIGH (ref 0.0–149.0)
VLDL: 33.6 mg/dL (ref 0.0–40.0)

## 2017-04-28 LAB — BASIC METABOLIC PANEL
BUN: 12 mg/dL (ref 6–23)
CHLORIDE: 106 meq/L (ref 96–112)
CO2: 29 mEq/L (ref 19–32)
Calcium: 9.6 mg/dL (ref 8.4–10.5)
Creatinine, Ser: 1.32 mg/dL (ref 0.40–1.50)
GFR: 56.04 mL/min — AB (ref >60.00)
Glucose, Bld: 73 mg/dL (ref 70–99)
Potassium: 4.2 mEq/L (ref 3.5–5.1)
Sodium: 141 mEq/L (ref 135–145)

## 2017-04-28 LAB — CBC WITH DIFFERENTIAL/PLATELET
Basophils Absolute: 0.1 10*3/uL (ref 0.0–0.1)
Basophils Relative: 0.6 % (ref 0.0–3.0)
EOS ABS: 0.2 10*3/uL (ref 0.0–0.7)
Eosinophils Relative: 1.6 % (ref 0.0–5.0)
HCT: 50.1 % (ref 39.0–52.0)
HEMOGLOBIN: 16.4 g/dL (ref 13.0–17.0)
LYMPHS ABS: 2.5 10*3/uL (ref 0.7–4.0)
Lymphocytes Relative: 25.3 % (ref 12.0–46.0)
MCHC: 32.7 g/dL (ref 30.0–36.0)
MCV: 96.2 fl (ref 78.0–100.0)
MONO ABS: 0.9 10*3/uL (ref 0.1–1.0)
Monocytes Relative: 8.9 % (ref 3.0–12.0)
NEUTROS PCT: 63.6 % (ref 43.0–77.0)
Neutro Abs: 6.3 10*3/uL (ref 1.4–7.7)
Platelets: 246 10*3/uL (ref 150.0–400.0)
RBC: 5.21 Mil/uL (ref 4.22–5.81)
RDW: 14.2 % (ref 11.5–15.5)
WBC: 9.8 10*3/uL (ref 4.0–10.5)

## 2017-04-28 LAB — POCT URINALYSIS DIPSTICK
Bilirubin, UA: NEGATIVE
Blood, UA: NEGATIVE
Glucose, UA: NEGATIVE
Ketones, UA: NEGATIVE
Leukocytes, UA: NEGATIVE
Nitrite, UA: NEGATIVE
PH UA: 6.5 (ref 5.0–8.0)
PROTEIN UA: NEGATIVE
SPEC GRAV UA: 1.015 (ref 1.010–1.025)
UROBILINOGEN UA: 0.2 U/dL

## 2017-04-28 LAB — TSH: TSH: 3.51 u[IU]/mL (ref 0.35–4.50)

## 2017-04-28 MED ORDER — ATORVASTATIN CALCIUM 40 MG PO TABS: 40.0000 mg | ORAL_TABLET | Freq: Every day | ORAL | 4 refills | Status: DC

## 2017-04-28 MED ORDER — SILDENAFIL CITRATE 20 MG PO TABS: 20.0000 mg | ORAL_TABLET | Freq: Every evening | ORAL | 11 refills | Status: DC | PRN

## 2017-04-28 MED ORDER — LOSARTAN POTASSIUM-HCTZ 50-12.5 MG PO TABS: ORAL_TABLET | ORAL | 3 refills | Status: DC

## 2017-04-28 NOTE — Patient Instructions (Signed)
Continue current medications  Labs today........ I will call if his anything abnormal  Walk 30 minutes daily............. consider joining the [facility]  Follow-up in one year sooner if any problems.

## 2017-04-28 NOTE — Progress Notes (Signed)
Timothy Duncan is a 76 year old single male nonsmoker who comes in today for annual physical examination because of a history of hypertension hyperlipidemia and erectile dysfunction  He takes Lipitor 40 mg and a baby aspirin daily. Lipid panel will be checked today. Last HDL was 45 a year ago.  He takes Hyzaar 50-12.5 ....... dose one half tab daily for hypertension. BP today 136/84  He uses generic Viagra when necessary for ED  He had a spell last fall and was taken the emergency room. He was confused disoriented and felt to have a small mild stroke. He retired from his Press photographer business.  He gets routine eye care, dental care, colonoscopy and GI  Vaccinations Pneumovax given he declines a flu shot. Information given on shingles.  He still has persistent back pain. He had surgery 2 years ago had to have a subsequent redo because of fluid collection around the spinal column. He doesn't walk on a regular basis he's gained 20 some pounds since last year.  Social history he is divorced lives here in [place] has a son Timothy Duncan lives in [place]. Timothy Duncan lives in [place] and is able to maintain his activities of daily living although seeing him today and seeing him 2 years ago this been a marked decline in his CNS acuity  14 point review of systems reviewed and otherwise negative  EKG was done because of a history of hypertension hyperlipidemia. EKG was normal and unchanged  Cognitive function normal he does not exercise daily home health safety reviewed no issues identified, no guns in the house, he does have a healthcare power of attorney and living well.  BP 136/84 (BP Location: Left Arm, Patient Position: Sitting, Cuff Size: Normal)   Pulse 76   Temp 98.3 F (36.8 C) (Oral)   Ht 5\' 11"  (1.803 m)   Wt 254 lb (115.2 kg)   BMI 35.43 kg/m  Well-developed well-nourished male just not as sharp is used to be since he had the stroke fall 2017. It left him with no obvious impairment except a  decline in his CNS acuity.  HEENT were negative except evidence of bilateral cataract with lens implants. Neck was supple thyroid not enlarged no carotid bruits. Cardiopulmonary exam normal Dahms exam normal genitalia normal circumcised male rectum normal stool guaiac-negative prostate 1+ smooth nonnodular BPH  Extremities normal skin normal peripheral pulses normal  #1 hypertension at goal.....Marland Kitchen continue current therapy check labs  #2 hyperlipidemia.... Continue Lipitor and aspirin check labs  #3 mild ED.....Marland Kitchen generic Viagra when necessary  #4 status post stroke fall 2017.......... currently able to maintain his activities of daily will and living and take care of himself and live independently  #5 status post lumbar disc surgery 2.Marland Kitchen

## 2018-05-05 ENCOUNTER — Encounter: Payer: Self-pay | Admitting: Family Medicine

## 2018-05-05 ENCOUNTER — Ambulatory Visit (INDEPENDENT_AMBULATORY_CARE_PROVIDER_SITE_OTHER): Payer: Medicare HMO | Admitting: Family Medicine

## 2018-05-05 VITALS — BP 136/86 | HR 97 | Temp 98.0°F | Wt 250.3 lb

## 2018-05-05 DIAGNOSIS — I10 Essential (primary) hypertension: Secondary | ICD-10-CM | POA: Diagnosis not present

## 2018-05-05 DIAGNOSIS — Z23 Encounter for immunization: Secondary | ICD-10-CM

## 2018-05-05 DIAGNOSIS — R32 Unspecified urinary incontinence: Secondary | ICD-10-CM | POA: Diagnosis not present

## 2018-05-05 DIAGNOSIS — E785 Hyperlipidemia, unspecified: Secondary | ICD-10-CM | POA: Diagnosis not present

## 2018-05-05 LAB — CBC WITH DIFFERENTIAL/PLATELET
BASOS ABS: 0.1 10*3/uL (ref 0.0–0.1)
Basophils Relative: 0.9 % (ref 0.0–3.0)
Eosinophils Absolute: 0.2 10*3/uL (ref 0.0–0.7)
Eosinophils Relative: 1.7 % (ref 0.0–5.0)
HEMATOCRIT: 49.9 % (ref 39.0–52.0)
Hemoglobin: 16.3 g/dL (ref 13.0–17.0)
LYMPHS PCT: 20.6 % (ref 12.0–46.0)
Lymphs Abs: 2.3 10*3/uL (ref 0.7–4.0)
MCHC: 32.7 g/dL (ref 30.0–36.0)
MCV: 93.7 fl (ref 78.0–100.0)
MONOS PCT: 8.9 % (ref 3.0–12.0)
Monocytes Absolute: 1 10*3/uL (ref 0.1–1.0)
NEUTROS PCT: 67.9 % (ref 43.0–77.0)
Neutro Abs: 7.7 10*3/uL (ref 1.4–7.7)
Platelets: 294 10*3/uL (ref 150.0–400.0)
RBC: 5.33 Mil/uL (ref 4.22–5.81)
RDW: 14.2 % (ref 11.5–15.5)
WBC: 11.3 10*3/uL — AB (ref 4.0–10.5)

## 2018-05-05 LAB — COMPREHENSIVE METABOLIC PANEL
ALK PHOS: 67 U/L (ref 39–117)
ALT: 9 U/L (ref 0–53)
AST: 12 U/L (ref 0–37)
Albumin: 4.5 g/dL (ref 3.5–5.2)
BILIRUBIN TOTAL: 1.6 mg/dL — AB (ref 0.2–1.2)
BUN: 10 mg/dL (ref 6–23)
CALCIUM: 9.8 mg/dL (ref 8.4–10.5)
CO2: 29 mEq/L (ref 19–32)
Chloride: 105 mEq/L (ref 96–112)
Creatinine, Ser: 1.06 mg/dL (ref 0.40–1.50)
GFR: 71.98 mL/min (ref >60.00)
GLUCOSE: 107 mg/dL — AB (ref 70–99)
Potassium: 4.3 mEq/L (ref 3.5–5.1)
Sodium: 142 mEq/L (ref 135–145)
TOTAL PROTEIN: 7.5 g/dL (ref 6.0–8.3)

## 2018-05-05 LAB — URINALYSIS
BILIRUBIN URINE: NEGATIVE
HGB URINE DIPSTICK: NEGATIVE
Ketones, ur: NEGATIVE
Leukocytes, UA: NEGATIVE
NITRITE: NEGATIVE
Specific Gravity, Urine: 1.01 (ref 1.000–1.030)
Total Protein, Urine: NEGATIVE
URINE GLUCOSE: NEGATIVE
Urobilinogen, UA: 0.2 (ref 0.0–1.0)
pH: 7 (ref 5.0–8.0)

## 2018-05-05 LAB — LIPID PANEL
CHOL/HDL RATIO: 4
Cholesterol: 183 mg/dL (ref 0–200)
HDL: 51.3 mg/dL (ref >39.00)
LDL Cholesterol: 101 mg/dL — ABNORMAL HIGH (ref 0–99)
NONHDL: 131.74
Triglycerides: 153 mg/dL — ABNORMAL HIGH (ref 0.0–149.0)
VLDL: 30.6 mg/dL (ref 0.0–40.0)

## 2018-05-05 LAB — TSH: TSH: 3.49 u[IU]/mL (ref 0.35–4.50)

## 2018-05-05 NOTE — Progress Notes (Signed)
Timothy Duncan DOB: September 09, 1940 Encounter date: 05/05/2018  This is a 77 y.o. male who presents to establish care. Chief Complaint  Patient presents with  . Transitions Of Care    no new concerns, flu shot    History of present illness:  Doesn't check blood pressure at home. Has not been taking the losartan; felt he didn't need to take it any more although was taking at last visit. Stopped lipitor around same time.   Not seeing any specialists.   No falls in the last year. States that back pain does not prevent him from getting around and doing the things that he needs to do. He lives alone and states that he is able to take care of AIDLs/ADL as he always has. Does not feel that back pain has progressed or become more complicated/worsened in last few years.   Describes mood as happy. (states even happier if [place] wins football game this weekend)   Past Medical History:  Diagnosis Date  . DVT (deep venous thrombosis) ([place]) 07/2015   RLE  . ED (erectile dysfunction)   . Hypertension   . Pulmonary embolism ([place])   . TIA (transient ischemic attack)    Past Surgical History:  Procedure Laterality Date  . APPENDECTOMY    . CATARACT EXTRACTION W/ INTRAOCULAR LENS  IMPLANT, BILATERAL Bilateral   . COLONOSCOPY    . LUMBAR LAMINECTOMY/DECOMPRESSION MICRODISCECTOMY Right 05/03/2015   Procedure: Microdiscectomy - Lumbar four-Lumbar five - right ;  Surgeon: Eustace Moore, MD;  Location: [facility];  Service: Neurosurgery;  Laterality: Right;  . TONSILLECTOMY    . WOUND EXPLORATION N/A 06/06/2015   Procedure: Presumed discitis, Lumbar exploration,Repeat discectomy Lumbar four-five with deep tissue culture;  Surgeon: Eustace Moore, MD;  Location: [facility];  Service: Neurosurgery;  Laterality: N/A;   No Known Allergies No outpatient medications have been marked as taking for the 05/05/18 encounter (Office Visit) with Caren Macadam, MD.   Social History   Tobacco Use  .  Smoking status: Former Smoker    Packs/day: 3.00    Years: 30.00    Pack years: 90.00    Types: Cigarettes  . Smokeless tobacco: Former Systems developer    Types: Snuff, Chew  . Tobacco comment: "quit smoking in the 1980s; used chew and snuff off and on, none for awhile"  Substance Use Topics  . Alcohol use: No    Alcohol/week: 0.0 standard drinks    Comment: 03/31/2016 "no drinking since 06/22/2005"   Family History  Problem Relation Age of Onset  . Early death Father        MVA  . Alcohol abuse Maternal Grandfather      Review of Systems  Constitutional: Negative for activity change, appetite change, chills, fatigue, fever and unexpected weight change.  HENT: Negative for congestion, ear pain, hearing loss, sinus pressure, sinus pain, sore throat and trouble swallowing.   Eyes: Negative for pain and visual disturbance.  Respiratory: Negative for cough, chest tightness, shortness of breath and wheezing.   Cardiovascular: Negative for chest pain, palpitations and leg swelling.  Gastrointestinal: Negative for abdominal distention, abdominal pain, blood in stool, constipation, diarrhea, nausea and vomiting.       No difficulty with bowels   Genitourinary: Negative for decreased urine volume, difficulty urinating, dysuria, penile pain and testicular pain.       Denies incontinence  Musculoskeletal: Positive for back pain (chronic, stable). Negative for arthralgias, gait problem, joint swelling and myalgias.  Skin:  Negative for rash.  Neurological: Negative for dizziness, weakness, numbness and headaches.  Hematological: Negative for adenopathy. Does not bruise/bleed easily.  Psychiatric/Behavioral: Negative for agitation and sleep disturbance. The patient is not nervous/anxious.     Objective:  BP 136/86   Pulse 97   Temp 98 F (36.7 C) (Oral)   Wt 250 lb 4.8 oz (113.5 kg)   SpO2 95%   BMI 34.91 kg/m   Weight: 250 lb 4.8 oz (113.5 kg)   BP Readings from Last 3 Encounters:  05/05/18  136/86  04/28/17 136/84  04/25/16 154/87   Wt Readings from Last 3 Encounters:  05/05/18 250 lb 4.8 oz (113.5 kg)  04/28/17 254 lb (115.2 kg)  04/25/16 225 lb (102.1 kg)    Physical Exam  Constitutional: He is oriented to person, place, and time. Vital signs are normal. He appears well-developed and well-nourished. No distress.  Cardiovascular: Normal rate, regular rhythm and normal heart sounds. Exam reveals no friction rub.  No murmur heard. No lower extremity edema  Pulmonary/Chest: Effort normal and breath sounds normal. No respiratory distress. He has no wheezes. He has no rales.  Neurological: He is alert and oriented to person, place, and time. He is not disoriented. He displays no tremor. Gait (gait is slow with smaller steps. he does not have a hard time getting started with walking) abnormal.  Has difficulty rising from chair; states this is typical for him and chairs at home have arms which make it easier to stand. Denies difficulty with getting around at home. States that he just takes his time.   Memory for health issues in med history (TIA), DVT he does not recall. Did search back to make sure viewing appropriate record and upon significant discussion does remember being on blood thinners.  Psychiatric: His behavior is normal. Thought content normal. His affect is blunt. His speech is delayed. Cognition and memory are normal.    Assessment/Plan:  1. Encounter for immunization - Flu vaccine HIGH DOSE PF  2. Hyperlipidemia, unspecified hyperlipidemia type Has stopped taking medication since last labwork. Discussed that further management will be pending these results. Discussed that controlling blood pressure and cholesterol will help decrease stroke/MI risk; but we can see what baseline numbers are. Blood pressure recheck in office today is acceptable off of his medication. - Lipid panel; Future - TSH; Future - TSH - Lipid panel  3. Hypertension, unspecified type BP  stable in office and acceptable level on recheck. Would recommend close recheck of this in 3-6 months to ensure stability due to longstanding htn/HL history as well as TIA hx. - Comprehensive metabolic panel; Future - CBC with Differential/Platelet; Future - CBC with Differential/Platelet - Comprehensive metabolic panel  4. Urinary incontinence, unspecified type Patient denies symptoms but does have some incontinence apparent on exam. Will make sure no underlying infection. If no infection would be very reasonable to consider further evaluation. I would consider evaluation for NPH due to some of forgetfulness on medical history taking, gait changes, and incontinence although since I have not seen [facility] in the past I do not have baseline for comparison.  - Urinalysis   Return pending bloodwork; AWV with nurse when able.  Micheline Rough, MD

## 2018-06-10 DIAGNOSIS — R0902 Hypoxemia: Secondary | ICD-10-CM | POA: Diagnosis not present

## 2018-06-10 DIAGNOSIS — M62830 Muscle spasm of back: Secondary | ICD-10-CM | POA: Diagnosis not present

## 2018-06-10 DIAGNOSIS — I447 Left bundle-branch block, unspecified: Secondary | ICD-10-CM | POA: Diagnosis not present

## 2018-06-10 DIAGNOSIS — R52 Pain, unspecified: Secondary | ICD-10-CM | POA: Diagnosis not present

## 2018-06-10 DIAGNOSIS — M5489 Other dorsalgia: Secondary | ICD-10-CM | POA: Diagnosis not present

## 2018-06-10 DIAGNOSIS — S300XXA Contusion of lower back and pelvis, initial encounter: Secondary | ICD-10-CM | POA: Diagnosis not present

## 2018-06-10 DIAGNOSIS — M545 Low back pain: Secondary | ICD-10-CM | POA: Diagnosis not present

## 2018-06-11 ENCOUNTER — Emergency Department (HOSPITAL_COMMUNITY): Payer: Medicare HMO

## 2018-06-11 ENCOUNTER — Emergency Department (HOSPITAL_COMMUNITY)
Admission: EM | Admit: 2018-06-11 | Discharge: 2018-06-11 | Disposition: A | Discharge status: Home / Self Care | Payer: Medicare HMO | Attending: Emergency Medicine | Admitting: Emergency Medicine

## 2018-06-11 ENCOUNTER — Encounter (HOSPITAL_COMMUNITY): Payer: Self-pay | Admitting: Emergency Medicine

## 2018-06-11 DIAGNOSIS — Y998 Other external cause status: Secondary | ICD-10-CM | POA: Diagnosis not present

## 2018-06-11 DIAGNOSIS — W010XXA Fall on same level from slipping, tripping and stumbling without subsequent striking against object, initial encounter: Secondary | ICD-10-CM | POA: Diagnosis not present

## 2018-06-11 DIAGNOSIS — Y939 Activity, unspecified: Secondary | ICD-10-CM | POA: Insufficient documentation

## 2018-06-11 DIAGNOSIS — M62830 Muscle spasm of back: Secondary | ICD-10-CM | POA: Diagnosis not present

## 2018-06-11 DIAGNOSIS — S3993XA Unspecified injury of pelvis, initial encounter: Secondary | ICD-10-CM | POA: Diagnosis not present

## 2018-06-11 DIAGNOSIS — S300XXA Contusion of lower back and pelvis, initial encounter: Secondary | ICD-10-CM | POA: Diagnosis not present

## 2018-06-11 DIAGNOSIS — Z86718 Personal history of other venous thrombosis and embolism: Secondary | ICD-10-CM | POA: Insufficient documentation

## 2018-06-11 DIAGNOSIS — Z87891 Personal history of nicotine dependence: Secondary | ICD-10-CM | POA: Diagnosis not present

## 2018-06-11 DIAGNOSIS — M546 Pain in thoracic spine: Secondary | ICD-10-CM | POA: Diagnosis not present

## 2018-06-11 DIAGNOSIS — I10 Essential (primary) hypertension: Secondary | ICD-10-CM | POA: Diagnosis not present

## 2018-06-11 DIAGNOSIS — M62838 Other muscle spasm: Secondary | ICD-10-CM

## 2018-06-11 DIAGNOSIS — Y929 Unspecified place or not applicable: Secondary | ICD-10-CM | POA: Diagnosis not present

## 2018-06-11 DIAGNOSIS — S3992XA Unspecified injury of lower back, initial encounter: Secondary | ICD-10-CM | POA: Diagnosis present

## 2018-06-11 DIAGNOSIS — Z8673 Personal history of transient ischemic attack (TIA), and cerebral infarction without residual deficits: Secondary | ICD-10-CM | POA: Insufficient documentation

## 2018-06-11 DIAGNOSIS — M545 Low back pain: Secondary | ICD-10-CM | POA: Diagnosis not present

## 2018-06-11 MED ORDER — OXYCODONE-ACETAMINOPHEN 5-325 MG PO TABS
1.0000 | ORAL_TABLET | ORAL | Status: DC | PRN
  Administered 2018-06-11 (×2): 1 via ORAL
  Administered 2018-06-11: 2 via ORAL
  Filled 2018-06-11: qty 2
  Filled 2018-06-11 (×2): qty 1

## 2018-06-11 NOTE — Progress Notes (Signed)
CSW spoke with pt at bedside. Pt refusing SNF placement at this time. CSW sought permission fro pt to speak with son and pt didn't agree. CSW sough information from pt on who lives with pt and pt expressed living alone. Pt reports  that pt doesn't need any help and can manage at home alone.    [name] Timothy Duncan, MSW, [facility] Clinical Social Worker [phone]

## 2018-06-11 NOTE — ED Provider Notes (Signed)
[facility] EMERGENCY DEPARTMENT Provider Note   CSN: [id number] Arrival date & time: 06/11/18  0033     History   Chief Complaint Chief Complaint  Patient presents with  . Fall  . Back Pain    HPI Timothy Duncan is a 77 y.o. male.  Patient presents to the emergency department for evaluation after a fall.  Patient reports that he was trying to sit down, got tangled up and ended up landing on the floor into a seated position.  Patient reports that he could not get back up on his own.  EMS has transported him to the hospital.  He was administered fentanyl during transport and is now currently pain-free.  Patient reports history of chronic back pain that was worsened by the fall.  He has not had any loss of strength or sensation in lower extremities.  He was incontinent of urine, but reports that he was trying to hold it but could not hold it any longer as he laid on the floor for some time before he could get help.  Patient not on blood thinners, did not hit his head.  No loss of consciousness.  No neck pain.  No chest pain, palpitations, dizziness, shortness of breath.     Past Medical History:  Diagnosis Date  . DVT (deep venous thrombosis) ([place]) 07/2015   RLE  . ED (erectile dysfunction)   . Hypertension   . Pulmonary embolism ([place])   . TIA (transient ischemic attack)     Patient Active Problem List   Diagnosis Date Noted  . Routine general medical examination at a health care facility 04/28/2017  . Lipids abnormal 04/28/2017  . Acute CVA (cerebrovascular accident) ([place]) 03/31/2016  . Cerebrovascular accident (CVA) ([place])   . S/P lumbar laminectomy 05/03/2015  . Actinic keratosis 02/04/2011  . BACK PAIN 09/03/2009  . ERECTILE DYSFUNCTION 03/24/2008  . Essential hypertension 03/24/2008    Past Surgical History:  Procedure Laterality Date  . APPENDECTOMY    . CATARACT EXTRACTION W/ INTRAOCULAR LENS  IMPLANT, BILATERAL Bilateral   . COLONOSCOPY    .  LUMBAR LAMINECTOMY/DECOMPRESSION MICRODISCECTOMY Right 05/03/2015   Procedure: Microdiscectomy - Lumbar four-Lumbar five - right ;  Surgeon: Eustace Moore, MD;  Location: [facility];  Service: Neurosurgery;  Laterality: Right;  . TONSILLECTOMY    . WOUND EXPLORATION N/A 06/06/2015   Procedure: Presumed discitis, Lumbar exploration,Repeat discectomy Lumbar four-five with deep tissue culture;  Surgeon: Eustace Moore, MD;  Location: [facility];  Service: Neurosurgery;  Laterality: N/A;        Home Medications    Prior to Admission medications   Medication Sig Start Date End Date Taking? Authorizing Provider  sildenafil (REVATIO) 20 MG tablet Take 1 tablet (20 mg total) by mouth at bedtime as needed. Patient not taking: Reported on 05/05/2018 04/28/17   Dorena Cookey, MD    Family History Family History  Problem Relation Age of Onset  . Early death Father        MVA  . Alcohol abuse Maternal Grandfather     Social History Social History   Tobacco Use  . Smoking status: Former Smoker    Packs/day: 3.00    Years: 30.00    Pack years: 90.00    Types: Cigarettes  . Smokeless tobacco: Former Systems developer    Types: Snuff, Chew  . Tobacco comment: "quit smoking in the 1980s; used chew and snuff off and on, none for awhile"  Substance Use Topics  . Alcohol use: No    Alcohol/week: 0.0 standard drinks    Comment: 03/31/2016 "no drinking since 06/22/2005"  . Drug use: No     Allergies   Patient has no known allergies.   Review of Systems Review of Systems  Musculoskeletal: Positive for back pain.  All other systems reviewed and are negative.    Physical Exam Updated Vital Signs BP (!) 160/87   Pulse 81   Temp 98.3 F (36.8 C) (Oral)   Resp (!) 22   SpO2 92%   Physical Exam Vitals signs and nursing note reviewed.  Constitutional:      General: He is not in acute distress.    Appearance: Normal appearance. He is well-developed.  HENT:     Head: Normocephalic and  atraumatic.     Right Ear: Hearing normal.     Left Ear: Hearing normal.     Nose: Nose normal.  Eyes:     Conjunctiva/sclera: Conjunctivae normal.     Pupils: Pupils are equal, round, and reactive to light.  Neck:     Musculoskeletal: Normal range of motion and neck supple.  Cardiovascular:     Rate and Rhythm: Regular rhythm.     Heart sounds: S1 normal and S2 normal. No murmur. No friction rub. No gallop.   Pulmonary:     Effort: Pulmonary effort is normal. No respiratory distress.     Breath sounds: Normal breath sounds.  Chest:     Chest wall: No tenderness.  Abdominal:     General: Bowel sounds are normal.     Palpations: Abdomen is soft.     Tenderness: There is no abdominal tenderness. There is no guarding or rebound. Negative signs include Murphy's sign and McBurney's sign.     Hernia: No hernia is present.  Musculoskeletal: Normal range of motion.     Right hip: Normal.     Left hip: Normal.     Lumbar back: He exhibits tenderness.     Comments: Patient able to easily raise both legs off the bed without weakness, referred pain or worsening of back pain  Skin:    General: Skin is warm and dry.     Findings: No rash.  Neurological:     Mental Status: He is alert and oriented to person, place, and time.     GCS: GCS eye subscore is 4. GCS verbal subscore is 5. GCS motor subscore is 6.     Cranial Nerves: No cranial nerve deficit.     Sensory: No sensory deficit.     Coordination: Coordination normal.  Psychiatric:        Speech: Speech normal.        Behavior: Behavior normal.        Thought Content: Thought content normal.      ED Treatments / Results  Labs (all labs ordered are listed, but only abnormal results are displayed) Labs Reviewed - No data to display  EKG None  Radiology Dg Thoracic Spine 2 View  Result Date: 06/11/2018 CLINICAL DATA:  Increased back pain with movement following a fall today. The patient has some chronic pain. EXAM: THORACIC  SPINE 2 VIEWS COMPARISON:  Chest radiographs dated 04/25/2016. FINDINGS: Mild multilevel degenerative changes. No fractures or subluxations are seen. The cervicothoracic junction is poorly visualized due to the thickness of the patient's shoulders. The cardiac silhouette is enlarged, accentuated by poor inspiration on the frontal view. IMPRESSION: 1. No fracture or subluxation. 2. Mild  multilevel degenerative changes. 3. Cardiomegaly. Electronically Signed   By: Claudie Revering M.D.   On: 06/11/2018 01:24   Dg Lumbar Spine Complete  Result Date: 06/11/2018 CLINICAL DATA:  Increased low back pain following a fall today. Chronic low back pain. EXAM: LUMBAR SPINE - COMPLETE 4+ VIEW COMPARISON:  Lumbar spine MR dated 03/31/2016. Lumbar spine radiographs dated 09/11/2015. FINDINGS: Five non-rib-bearing lumbar vertebrae. Minimal levoconvex lumbar rotary scoliosis, unchanged. Extensive facet degenerative changes throughout the lumbar spine with no significant change in associated grade 1 spondylolisthesis at the L4-5 level. Stable marked disc space narrowing at that level with discogenic sclerosis. No fractures or pars defects are seen. IMPRESSION: No acute abnormality. Stable degenerative changes. Electronically Signed   By: Claudie Revering M.D.   On: 06/11/2018 01:26   Dg Pelvis 1-2 Views  Result Date: 06/11/2018 CLINICAL DATA:  Buttock pain after falling on the buttocks today. EXAM: PELVIS - 1-2 VIEW COMPARISON:  None. FINDINGS: There is no evidence of pelvic fracture or diastasis. No pelvic bone lesions are seen. IMPRESSION: No fracture. Electronically Signed   By: Claudie Revering M.D.   On: 06/11/2018 01:27    Procedures Procedures (including critical care time)  Medications Ordered in ED Medications - No data to display   Initial Impression / Assessment and Plan / ED Course  I have reviewed the triage vital signs and the nursing notes.  Pertinent labs & imaging results that were available during my care  of the patient were reviewed by me and considered in my medical decision making (see chart for details).     Patient presents to the emergency department for evaluation of back pain after a fall.  Patient has a history of chronic low back pain.  He had a mechanical based fall earlier tonight.  Patient currently resides in a home alone.  He is accompanied by his son tonight.  Son reports that he has noticed that his father has had some recent falls and appears to be exhibiting signs of dementia and confusion.  Son is currently in the process of trying to obtain healthcare and financial power of attorney in order to make some decisions about patient's living arrangements and future care.  Patient complaining of low back pain.  He is pain-free at rest but has significant pain, likely spasm, when he moves.  X-rays negative.  Patient still unable to sit up or get out of bed here in the ER, having received 150 mg of fentanyl prior to arrival.  He would not be able to be discharged home at this time, however, does not meet any criteria for admission currently.  Will hold in the ER, provide pain control, social work/case management/PT eval in the morning for discharge planning.  Final Clinical Impressions(s) / ED Diagnoses   Final diagnoses:  Contusion of lower back, initial encounter  Muscle spasm    ED Discharge Orders    None       P[place], Gwenyth Allegra, MD 06/11/18 [phone]

## 2018-06-11 NOTE — ED Triage Notes (Signed)
Patient is from home, fell earlier today and landed straight onto buttocks.  He has chronic lower back pain, but now he is having more pain with movement.  No loss of bowels or bladder control, pain to palpation to lumbar spine.  Patient was given 183mcg of fentanyl for pain en route to ED.  Patient desats when laying down.

## 2018-06-11 NOTE — Discharge Planning (Addendum)
EDCM has recommended that this patient have [facility] but pt declines at this time. I have discussed the benefits of home health services as wll as the risks of not having home health services with pt.  Pt states he thinks this is a isolated incident and he usually does fine at home. [facility] informed patient that if he changed his mind, his primary care physician can order home health from office.The patient verbalizes understanding. No further EDCM needs identified at this time.

## 2018-06-11 NOTE — Evaluation (Signed)
Physical Therapy Evaluation Patient Details Name: Timothy Duncan MRN: [id number] DOB: 05-09-1941 Today's Date: 06/11/2018   History of Present Illness  Pt is a 77 y/o male presenting to the ED after fall. Imaging negative for acute abnormality, however, pt with increased back pain. PMH includes DVT, HTN, PE, TIA, and lumbar surgery.   Clinical Impression  Pt presenting to ED with problem above and deficits below. Pt with decreased safety awareness and not following safety cues for mobility. Required mod A for mobility tasks using RW and pt had increased unsteadiness with increased gait distance. Pt currently lives alone and feel pt is at a high risk for falls. Feel pt would benefit from SNF at d/c, however, pt may refuse. Will continue to follow acutely to maximize functional mobility independence and safety.     Follow Up Recommendations SNF;Supervision/Assistance - 24 hour(if pt refuses, will need max HHservices)    Equipment Recommendations  Rolling walker with 5" wheels;3in1 (PT)    Recommendations for Other Services       Precautions / Restrictions Precautions Precautions: Back;Fall Precaution Booklet Issued: No Precaution Comments: Attempted to educate about back precautions to help with back pain, however, pt not following cues to maintain throughout.  Restrictions Weight Bearing Restrictions: No      Mobility  Bed Mobility Overal bed mobility: Needs Assistance Bed Mobility: Supine to Sit;Sit to Supine     Supine to sit: Mod assist Sit to supine: Mod assist   General bed mobility comments: Cued for log roll technique, however, pt did not follow cues. Required mod A for LE assist and trunk assist to perform bed mobility tasks.   Transfers Overall transfer level: Needs assistance Equipment used: Rolling walker (2 wheeled) Transfers: Sit to/from Stand Sit to Stand: Mod assist         General transfer comment: Mod A for lift asssit and steadying assist. Required  multiple attempts to achieve full upright standing.   Ambulation/Gait Ambulation/Gait assistance: Min assist;Mod assist Gait Distance (Feet): 30 Feet Assistive device: Rolling walker (2 wheeled) Gait Pattern/deviations: Step-through pattern;Decreased stride length;Trunk flexed Gait velocity: Decreased    General Gait Details: Pt unsteady during gait and unsteadiness increased with increased distance. Cued pt for upright posture, however, pt not responding to cues. Pt unaware of unsteadiness and deficits during gait and required education about deficits upon return to stretcher.   Stairs            Wheelchair Mobility    Modified Rankin (Stroke Patients Only)       Balance Overall balance assessment: Needs assistance Sitting-balance support: Bilateral upper extremity supported;Feet supported Sitting balance-Leahy Scale: Poor Sitting balance - Comments: Reliant on BUE support    Standing balance support: Bilateral upper extremity supported;During functional activity Standing balance-Leahy Scale: Poor Standing balance comment: Heavy reliance on UE support                              Pertinent Vitals/Pain Pain Assessment: Faces Faces Pain Scale: Hurts even more Pain Location: back Pain Descriptors / Indicators: Grimacing;Guarding Pain Intervention(s): Monitored during session;Limited activity within patient's tolerance;Repositioned    Home Living Family/patient expects to be discharged to:: Private residence Living Arrangements: Alone Available Help at Discharge: Family;Available PRN/intermittently Type of Home: House Home Access: Stairs to enter Entrance Stairs-Rails: (has rails but did not specify which side) Entrance Stairs-Number of Steps: 3 Home Layout: Two level;Able to live on main level with bedroom/bathroom Home  Equipment: None      Prior Function Level of Independence: Independent               Hand Dominance         Extremity/Trunk Assessment   Upper Extremity Assessment Upper Extremity Assessment: Generalized weakness    Lower Extremity Assessment Lower Extremity Assessment: Generalized weakness    Cervical / Trunk Assessment Cervical / Trunk Assessment: Other exceptions Cervical / Trunk Exceptions: increased back pain  Communication   Communication: No difficulties  Cognition Arousal/Alertness: Awake/alert Behavior During Therapy: Impulsive Overall Cognitive Status: No family/caregiver present to determine baseline cognitive functioning                                 General Comments: Pt with decreased safety awareness and very unaware of deficits. Pt not listening to safety cues of PT. Unsure of baseline for pt.       General Comments General comments (skin integrity, edema, etc.): No family present during session. Attempted to educate about SNF, however, pt reports he will go stay with his son.     Exercises     Assessment/Plan    PT Assessment Patient needs continued PT services  PT Problem List Decreased strength;Decreased balance;Decreased activity tolerance;Decreased mobility;Decreased knowledge of use of DME;Decreased safety awareness;Decreased knowledge of precautions;Decreased cognition;Pain       PT Treatment Interventions DME instruction;Gait training;Stair training;Therapeutic activities;Functional mobility training;Therapeutic exercise;Balance training;Patient/family education    PT Goals (Current goals can be found in the Care Plan section)  Acute Rehab PT Goals Patient Stated Goal: to go home PT Goal Formulation: With patient Time For Goal Achievement: 06/25/18 Potential to Achieve Goals: Good    Frequency Min 3X/week   Barriers to discharge Decreased caregiver support      Co-evaluation               AM-PAC PT "6 Clicks" Mobility  Outcome Measure Help needed turning from your back to your side while in a flat bed without using  bedrails?: A Lot Help needed moving from lying on your back to sitting on the side of a flat bed without using bedrails?: A Lot Help needed moving to and from a bed to a chair (including a wheelchair)?: A Lot Help needed standing up from a chair using your arms (e.g., wheelchair or bedside chair)?: A Lot Help needed to walk in hospital room?: A Lot Help needed climbing 3-5 steps with a railing? : Total 6 Click Score: 11    End of Session   Activity Tolerance: Patient limited by pain Patient left: in bed;with call bell/phone within reach Nurse Communication: Mobility status PT Visit Diagnosis: Unsteadiness on feet (R26.81);Muscle weakness (generalized) (M62.81);Pain Pain - part of body: (back)    Time: 1249-1310 PT Time Calculation (min) (ACUTE ONLY): 21 min   Charges:   PT Evaluation $PT Eval Moderate Complexity: [place], PT, DPT  Acute Rehabilitation Services  Pager: [phone] Office: [phone]   Rudean Hitt 06/11/2018, 1:50 PM

## 2018-06-11 NOTE — ED Provider Notes (Signed)
Patient has been seen and evaluated by case management and social work, and options including physical therapy, rehabilitation placement have been discussed with him. Patient is not amenable to any of these offerings, reportedly. Patient remains in no distress per Patient will be discharged with family members.   Carmin Muskrat, MD 06/11/18 [phone]

## 2018-06-11 NOTE — Discharge Instructions (Signed)
As discussed, your evaluation today has been largely reassuring.  But, it is important that you monitor your condition carefully, and do not hesitate to return to the ED if you develop new, or concerning changes in your condition. ? ?Otherwise, please follow-up with your physician for appropriate ongoing care. ? ?

## 2018-06-11 NOTE — ED Notes (Signed)
Pt stable, family here to pick up, states understanding of discharge instructions, pt was not able to stand on his own at discharge, refused to stay at hospital.  Family endorses he lives alone and is unable to care for himself but refuses to accept placement.

## 2018-06-11 NOTE — ED Notes (Signed)
Pt was taken [facility] room 50. Report given to [place], Therapist, sports. Pt's daughter arrived at time of pt relocation. Family updated on situation. EDP notified of family arrival and informed this RN d/c would be done shortly.

## 2018-06-14 ENCOUNTER — Emergency Department (HOSPITAL_COMMUNITY): Payer: Medicare HMO

## 2018-06-14 ENCOUNTER — Encounter (HOSPITAL_COMMUNITY): Payer: Self-pay

## 2018-06-14 ENCOUNTER — Observation Stay (HOSPITAL_COMMUNITY)
Admission: EM | Admit: 2018-06-14 | Discharge: 2018-06-21 | Disposition: A | Discharge status: Skilled Nursing Facility | Payer: Medicare HMO | Attending: Internal Medicine | Admitting: Internal Medicine

## 2018-06-14 DIAGNOSIS — M6282 Rhabdomyolysis: Secondary | ICD-10-CM | POA: Diagnosis not present

## 2018-06-14 DIAGNOSIS — Z7982 Long term (current) use of aspirin: Secondary | ICD-10-CM | POA: Diagnosis not present

## 2018-06-14 DIAGNOSIS — R059 Cough, unspecified: Secondary | ICD-10-CM

## 2018-06-14 DIAGNOSIS — E785 Hyperlipidemia, unspecified: Secondary | ICD-10-CM | POA: Insufficient documentation

## 2018-06-14 DIAGNOSIS — I5022 Chronic systolic (congestive) heart failure: Secondary | ICD-10-CM | POA: Insufficient documentation

## 2018-06-14 DIAGNOSIS — Z86718 Personal history of other venous thrombosis and embolism: Secondary | ICD-10-CM | POA: Insufficient documentation

## 2018-06-14 DIAGNOSIS — M545 Low back pain: Secondary | ICD-10-CM | POA: Diagnosis not present

## 2018-06-14 DIAGNOSIS — F0390 Unspecified dementia without behavioral disturbance: Secondary | ICD-10-CM | POA: Insufficient documentation

## 2018-06-14 DIAGNOSIS — M5489 Other dorsalgia: Secondary | ICD-10-CM | POA: Diagnosis not present

## 2018-06-14 DIAGNOSIS — R0602 Shortness of breath: Secondary | ICD-10-CM

## 2018-06-14 DIAGNOSIS — E860 Dehydration: Secondary | ICD-10-CM | POA: Diagnosis not present

## 2018-06-14 DIAGNOSIS — E876 Hypokalemia: Secondary | ICD-10-CM | POA: Diagnosis not present

## 2018-06-14 DIAGNOSIS — I110 Hypertensive heart disease with heart failure: Secondary | ICD-10-CM | POA: Diagnosis not present

## 2018-06-14 DIAGNOSIS — R05 Cough: Secondary | ICD-10-CM

## 2018-06-14 DIAGNOSIS — E870 Hyperosmolality and hypernatremia: Principal | ICD-10-CM | POA: Insufficient documentation

## 2018-06-14 DIAGNOSIS — Z79899 Other long term (current) drug therapy: Secondary | ICD-10-CM | POA: Insufficient documentation

## 2018-06-14 DIAGNOSIS — R4020 Unspecified coma: Secondary | ICD-10-CM | POA: Diagnosis not present

## 2018-06-14 DIAGNOSIS — R627 Adult failure to thrive: Secondary | ICD-10-CM | POA: Diagnosis not present

## 2018-06-14 DIAGNOSIS — G9341 Metabolic encephalopathy: Secondary | ICD-10-CM | POA: Insufficient documentation

## 2018-06-14 DIAGNOSIS — Z8673 Personal history of transient ischemic attack (TIA), and cerebral infarction without residual deficits: Secondary | ICD-10-CM | POA: Diagnosis not present

## 2018-06-14 DIAGNOSIS — F05 Delirium due to known physiological condition: Secondary | ICD-10-CM | POA: Diagnosis not present

## 2018-06-14 DIAGNOSIS — R410 Disorientation, unspecified: Secondary | ICD-10-CM | POA: Diagnosis not present

## 2018-06-14 DIAGNOSIS — K760 Fatty (change of) liver, not elsewhere classified: Secondary | ICD-10-CM | POA: Insufficient documentation

## 2018-06-14 DIAGNOSIS — R0902 Hypoxemia: Secondary | ICD-10-CM | POA: Diagnosis not present

## 2018-06-14 DIAGNOSIS — R404 Transient alteration of awareness: Secondary | ICD-10-CM | POA: Diagnosis not present

## 2018-06-14 DIAGNOSIS — I10 Essential (primary) hypertension: Secondary | ICD-10-CM | POA: Diagnosis present

## 2018-06-14 DIAGNOSIS — S299XXA Unspecified injury of thorax, initial encounter: Secondary | ICD-10-CM | POA: Diagnosis not present

## 2018-06-14 DIAGNOSIS — R748 Abnormal levels of other serum enzymes: Secondary | ICD-10-CM

## 2018-06-14 MED ORDER — SODIUM CHLORIDE 0.9 % IV SOLN
INTRAVENOUS | Status: DC
  Administered 2018-06-14 – 2018-06-18 (×5): via INTRAVENOUS

## 2018-06-14 MED ORDER — SODIUM CHLORIDE 0.9 % IV BOLUS
1000.0000 mL | Freq: Once | INTRAVENOUS | Status: AC
  Administered 2018-06-14: 1000 mL via INTRAVENOUS

## 2018-06-14 NOTE — ED Notes (Signed)
Pt refusing to get in a gown at this time. 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

## 2018-06-14 NOTE — ED Provider Notes (Addendum)
[facility] DEPT Provider Note   CSN: [id number] Arrival date & time: 06/14/18  2305     History   Chief Complaint Chief Complaint  Patient presents with  . Fall    HPI Timothy Duncan is a 77 y.o. male.  This is a 77 year old male who was found by his family today on the floor after not being seen for possible 24 hours.  Patient states that his slippers got stuck on a rug and he fell down.  Denies striking his head.  Denies any head or neck discomfort.  No chest or abdominal discomfort.  Seen recently in the ED 3 days ago and was offered placement which he declined.  Denies any recent vomiting or diarrhea.  States he has not had any to eat or drink for the past 2 to 3 days but cannot say why.  Denies any new medications.  Transported via EMS     Past Medical History:  Diagnosis Date  . 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 ED (erectile dysfunction)   . Hypertension   . Pulmonary embolism ([place])   . TIA (transient ischemic attack)     Patient Active Problem List   Diagnosis Date Noted  . Routine general medical examination at a health care facility 04/28/2017  . Lipids abnormal 04/28/2017  . Acute CVA (cerebrovascular accident) ([place]) 03/31/2016  . Cerebrovascular accident (CVA) ([place])   . S/P lumbar laminectomy 05/03/2015  . Actinic keratosis 02/04/2011  . BACK PAIN 09/03/2009  . ERECTILE DYSFUNCTION 03/24/2008  . Essential hypertension 03/24/2008    Past Surgical History:  Procedure Laterality Date  . APPENDECTOMY    . CATARACT EXTRACTION W/ INTRAOCULAR LENS  IMPLANT, BILATERAL Bilateral   . COLONOSCOPY    . LUMBAR LAMINECTOMY/DECOMPRESSION MICRODISCECTOMY Right 05/03/2015   Procedure: Microdiscectomy - Lumbar four-Lumbar five - right ;  Surgeon: Eustace Moore, MD;  Location: [facility]UR[facility] ORS;  Service: Neurosurgery;  Laterality: Right;  . TONSILLECTOMY    . WOUND EXPLORATION N/A 06/06/2015   Procedure: Presumed discitis,  Lumbar exploration,Repeat discectomy Lumbar four-five with deep tissue culture;  Surgeon: Eustace Moore, MD;  Location: [facility] NEUR[facility] ORS;  Service: Neurosurgery;  Laterality: N/A;        Home Medications    Prior to Admission medications   Medication Sig Start Date End Date Taking? Authorizing Provider  sildenafil (REVATIO) 20 MG tablet Take 1 tablet (20 mg total) by mouth at bedtime as needed. Patient not taking: Reported on 05/05/2018 04/28/17   Dorena Cookey, MD    Family History Family History  Problem Relation Age of Onset  . Early death Father        MVA  . Alcohol abuse Maternal Grandfather     Social History Social History   Tobacco Use  . Smoking status: Former Smoker    Packs/day: 3.00    Years: 30.00    Pack years: 90.00    Types: Cigarettes  . Smokeless tobacco: Former Systems developer    Types: Snuff, Chew  . Tobacco comment: "quit smoking in the 1980s; used chew and snuff off and on, none for awhile"  Substance Use Topics  . Alcohol use: No    Alcohol/week: 0.0 standard drinks    Comment: 03/31/2016 "no drinking since 06/22/2005"  . Drug use: No     Allergies   Patient has no known allergies.   Review of Systems Review of Systems  All other systems reviewed and are  negative.    Physical Exam Updated Vital Signs BP 102/81 (BP Location: Left Arm)   Pulse (!) 102   Temp 97.9 F (36.6 C) (Oral)   Resp 18   Ht 1.778 m (5\' 10" )   Wt 104.3 kg   SpO2 96%   BMI 33.00 kg/m   Physical Exam Vitals signs and nursing note reviewed.  Constitutional:      General: He is not in acute distress.    Appearance: Normal appearance. He is well-developed. He is not toxic-appearing.  HENT:     Head: Normocephalic and atraumatic.  Eyes:     General: Lids are normal.     Conjunctiva/sclera: Conjunctivae normal.     Pupils: Pupils are equal, round, and reactive to light.  Neck:     Musculoskeletal: Normal range of motion and neck supple.     Thyroid: No thyroid mass.       Trachea: No tracheal deviation.  Cardiovascular:     Rate and Rhythm: Normal rate and regular rhythm.     Heart sounds: Normal heart sounds. No murmur. No gallop.   Pulmonary:     Effort: Pulmonary effort is normal. No respiratory distress.     Breath sounds: Normal breath sounds. No stridor. No decreased breath sounds, wheezing, rhonchi or rales.  Abdominal:     General: Bowel sounds are normal. There is no distension.     Palpations: Abdomen is soft.     Tenderness: There is no abdominal tenderness. There is no rebound.  Musculoskeletal: Normal range of motion.        General: No tenderness.  Skin:    General: Skin is warm and dry.     Findings: No abrasion or rash.  Neurological:     Mental Status: He is alert and oriented to person, place, and time.     GCS: GCS eye subscore is 4. GCS verbal subscore is 5. GCS motor subscore is 6.     Cranial Nerves: No cranial nerve deficit.     Sensory: No sensory deficit.  Psychiatric:        Attention and Perception: Attention normal.        Mood and Affect: Mood normal.        Speech: Speech normal.        Behavior: Behavior normal.        Thought Content: Thought content is not delusional.      ED Treatments / Results  Labs (all labs ordered are listed, but only abnormal results are displayed) Labs Reviewed  URINALYSIS, ROUTINE W REFLEX MICROSCOPIC  CBC WITH DIFFERENTIAL/PLATELET  COMPREHENSIVE METABOLIC PANEL  CK  I-STAT TROPONIN, ED    EKG None  Radiology No results found.  Procedures Procedures (including critical care time)  Medications Ordered in ED Medications  sodium chloride 0.9 % bolus 1,000 mL (has no administration in time range)  0.9 %  sodium chloride infusion (has no administration in time range)     Initial Impression / Assessment and Plan / ED Course  I have reviewed the triage vital signs and the nursing notes.  Pertinent labs & imaging results that were available during my care of the  patient were reviewed by me and considered in my medical decision making (see chart for details).     Patient given IV fluids here.  Has evidence of dehydration with ketones in his urine as well as some hypernatremia.  Head CT negative.  Mild leukocytosis noted on CBC.  Attempted to ambulate  without success and this was attempted after patient received IV hydration.  Mild hypokalemia treated with oral potassium.  Does have an elevated CK as well as patient had been on the ground for 24 hours likely.  Will consult hospitalist for admission  Final Clinical Impressions(s) / ED Diagnoses   Final diagnoses:  SOB (shortness of breath)    ED Discharge Orders    None       Lacretia Leigh, MD 06/15/18 1155    Lacretia Leigh, MD 07/01/18 1051

## 2018-06-14 NOTE — ED Notes (Signed)
Bed: OT77 Expected date:  Expected time:  Means of arrival:  Comments: EMS 77 yo male from home-fall-on ground >12 hours-back pain-just discharged from [facility] 160/100

## 2018-06-14 NOTE — ED Triage Notes (Signed)
Pt was seen at hospital and family brought him home, found him today on the floor. Estimated time between 12-24 hours on the floor. Pt complaining of lower back pain per normal.

## 2018-06-15 ENCOUNTER — Other Ambulatory Visit: Payer: Self-pay

## 2018-06-15 ENCOUNTER — Emergency Department (HOSPITAL_COMMUNITY): Payer: Medicare HMO

## 2018-06-15 DIAGNOSIS — E876 Hypokalemia: Secondary | ICD-10-CM

## 2018-06-15 DIAGNOSIS — I10 Essential (primary) hypertension: Secondary | ICD-10-CM

## 2018-06-15 DIAGNOSIS — G9341 Metabolic encephalopathy: Secondary | ICD-10-CM

## 2018-06-15 DIAGNOSIS — E860 Dehydration: Secondary | ICD-10-CM | POA: Diagnosis not present

## 2018-06-15 DIAGNOSIS — M6282 Rhabdomyolysis: Secondary | ICD-10-CM | POA: Diagnosis not present

## 2018-06-15 DIAGNOSIS — R4020 Unspecified coma: Secondary | ICD-10-CM | POA: Diagnosis not present

## 2018-06-15 DIAGNOSIS — E870 Hyperosmolality and hypernatremia: Secondary | ICD-10-CM | POA: Diagnosis not present

## 2018-06-15 DIAGNOSIS — S299XXA Unspecified injury of thorax, initial encounter: Secondary | ICD-10-CM | POA: Diagnosis not present

## 2018-06-15 LAB — CBC WITH DIFFERENTIAL/PLATELET
Abs Immature Granulocytes: 0.06 10*3/uL (ref 0.00–0.07)
Abs Immature Granulocytes: 0.07 10*3/uL (ref 0.00–0.07)
Basophils Absolute: 0.1 10*3/uL (ref 0.0–0.1)
Basophils Absolute: 0.1 10*3/uL (ref 0.0–0.1)
Basophils Relative: 0 %
Basophils Relative: 1 %
EOS PCT: 1 %
Eosinophils Absolute: 0.1 10*3/uL (ref 0.0–0.5)
Eosinophils Absolute: 0.2 10*3/uL (ref 0.0–0.5)
Eosinophils Relative: 2 %
HCT: 51.3 % (ref 39.0–52.0)
HEMATOCRIT: 46.2 % (ref 39.0–52.0)
HEMOGLOBIN: 16.7 g/dL (ref 13.0–17.0)
Hemoglobin: 14.8 g/dL (ref 13.0–17.0)
Immature Granulocytes: 1 %
Immature Granulocytes: 1 %
LYMPHS PCT: 12 %
Lymphocytes Relative: 14 %
Lymphs Abs: 1.7 10*3/uL (ref 0.7–4.0)
Lymphs Abs: 1.8 10*3/uL (ref 0.7–4.0)
MCH: 31.3 pg (ref 26.0–34.0)
MCH: 31.3 pg (ref 26.0–34.0)
MCHC: 32 g/dL (ref 30.0–36.0)
MCHC: 32.6 g/dL (ref 30.0–36.0)
MCV: 96.2 fL (ref 80.0–100.0)
MCV: 97.7 fL (ref 80.0–100.0)
Monocytes Absolute: 0.9 10*3/uL (ref 0.1–1.0)
Monocytes Absolute: 1.2 10*3/uL — ABNORMAL HIGH (ref 0.1–1.0)
Monocytes Relative: 7 %
Monocytes Relative: 8 %
NEUTROS PCT: 76 %
Neutro Abs: 11.5 10*3/uL — ABNORMAL HIGH (ref 1.7–7.7)
Neutro Abs: 9.3 10*3/uL — ABNORMAL HIGH (ref 1.7–7.7)
Neutrophils Relative %: 77 %
Platelets: 186 10*3/uL (ref 150–400)
Platelets: 215 10*3/uL (ref 150–400)
RBC: 4.73 MIL/uL (ref 4.22–5.81)
RBC: 5.33 MIL/uL (ref 4.22–5.81)
RDW: 13.7 % (ref 11.5–15.5)
RDW: 13.8 % (ref 11.5–15.5)
WBC: 12.2 10*3/uL — ABNORMAL HIGH (ref 4.0–10.5)
WBC: 14.8 10*3/uL — ABNORMAL HIGH (ref 4.0–10.5)
nRBC: 0 % (ref 0.0–0.2)
nRBC: 0 % (ref 0.0–0.2)

## 2018-06-15 LAB — BASIC METABOLIC PANEL
Anion gap: 9 (ref 5–15)
BUN: 17 mg/dL (ref 8–23)
CALCIUM: 8.5 mg/dL — AB (ref 8.9–10.3)
CO2: 21 mmol/L — ABNORMAL LOW (ref 22–32)
CREATININE: 0.94 mg/dL (ref 0.61–1.24)
Chloride: 116 mmol/L — ABNORMAL HIGH (ref 98–111)
GFR calc Af Amer: >60 mL/min (ref >60)
GFR calc non Af Amer: >60 mL/min (ref >60)
Glucose, Bld: 159 mg/dL — ABNORMAL HIGH (ref 70–99)
Potassium: 3.9 mmol/L (ref 3.5–5.1)
Sodium: 146 mmol/L — ABNORMAL HIGH (ref 135–145)

## 2018-06-15 LAB — COMPREHENSIVE METABOLIC PANEL
ALK PHOS: 61 U/L (ref 38–126)
ALT: 19 U/L (ref 0–44)
AST: 33 U/L (ref 15–41)
Albumin: 4.4 g/dL (ref 3.5–5.0)
Anion gap: 14 (ref 5–15)
BUN: 18 mg/dL (ref 8–23)
CO2: 23 mmol/L (ref 22–32)
Calcium: 9.2 mg/dL (ref 8.9–10.3)
Chloride: 111 mmol/L (ref 98–111)
Creatinine, Ser: 1.08 mg/dL (ref 0.61–1.24)
GFR calc Af Amer: >60 mL/min (ref >60)
GFR calc non Af Amer: >60 mL/min (ref >60)
Glucose, Bld: 123 mg/dL — ABNORMAL HIGH (ref 70–99)
Potassium: 3.3 mmol/L — ABNORMAL LOW (ref 3.5–5.1)
Sodium: 148 mmol/L — ABNORMAL HIGH (ref 135–145)
Total Bilirubin: 2.6 mg/dL — ABNORMAL HIGH (ref 0.3–1.2)
Total Protein: 7.9 g/dL (ref 6.5–8.1)

## 2018-06-15 LAB — URINALYSIS, ROUTINE W REFLEX MICROSCOPIC
Bacteria, UA: NONE SEEN
Bilirubin Urine: NEGATIVE
Glucose, UA: NEGATIVE mg/dL
Ketones, ur: 20 mg/dL — AB
Leukocytes, UA: NEGATIVE
Nitrite: NEGATIVE
Protein, ur: NEGATIVE mg/dL
Specific Gravity, Urine: 1.014 (ref 1.005–1.030)
pH: 6 (ref 5.0–8.0)

## 2018-06-15 LAB — ETHANOL: Alcohol, Ethyl (B): <10 mg/dL (ref <10)

## 2018-06-15 LAB — I-STAT TROPONIN, ED: Troponin i, poc: 0.03 ng/mL (ref 0.00–0.08)

## 2018-06-15 LAB — TSH: TSH: 2.165 u[IU]/mL (ref 0.350–4.500)

## 2018-06-15 LAB — CK
CK TOTAL: 714 U/L — AB (ref 49–397)
Total CK: 576 U/L — ABNORMAL HIGH (ref 49–397)

## 2018-06-15 LAB — VITAMIN B12: VITAMIN B 12: 383 pg/mL (ref 180–914)

## 2018-06-15 MED ORDER — ASPIRIN 81 MG PO CHEW
81.0000 mg | CHEWABLE_TABLET | Freq: Every day | ORAL | Status: DC
  Administered 2018-06-15 – 2018-06-21 (×7): 81 mg via ORAL
  Filled 2018-06-15 (×7): qty 1

## 2018-06-15 MED ORDER — ONDANSETRON HCL 4 MG/2ML IJ SOLN: 4.0000 mg | Freq: Four times a day (QID) | INTRAMUSCULAR | Status: DC | PRN

## 2018-06-15 MED ORDER — ENOXAPARIN SODIUM 40 MG/0.4ML ~~LOC~~ SOLN
40.0000 mg | SUBCUTANEOUS | Status: DC
  Administered 2018-06-15 – 2018-06-21 (×6): 40 mg via SUBCUTANEOUS
  Filled 2018-06-15 (×5): qty 0.4

## 2018-06-15 MED ORDER — LISINOPRIL 20 MG PO TABS
20.0000 mg | ORAL_TABLET | Freq: Every day | ORAL | Status: DC
  Administered 2018-06-15 – 2018-06-19 (×5): 20 mg via ORAL
  Filled 2018-06-15 (×5): qty 1

## 2018-06-15 MED ORDER — POTASSIUM CHLORIDE CRYS ER 20 MEQ PO TBCR
40.0000 meq | EXTENDED_RELEASE_TABLET | Freq: Once | ORAL | Status: AC
  Administered 2018-06-15: 40 meq via ORAL
  Filled 2018-06-15: qty 2

## 2018-06-15 MED ORDER — ACETAMINOPHEN 650 MG RE SUPP: 650.0000 mg | Freq: Four times a day (QID) | RECTAL | Status: DC | PRN

## 2018-06-15 MED ORDER — ACETAMINOPHEN 325 MG PO TABS
650.0000 mg | ORAL_TABLET | Freq: Four times a day (QID) | ORAL | Status: DC | PRN
  Administered 2018-06-21: 650 mg via ORAL
  Filled 2018-06-15 (×2): qty 2

## 2018-06-15 MED ORDER — ONDANSETRON HCL 4 MG PO TABS: 4.0000 mg | ORAL_TABLET | Freq: Four times a day (QID) | ORAL | Status: DC | PRN

## 2018-06-15 NOTE — ED Notes (Addendum)
Pt transferred to a hospital bed, given 2 ginger ales per request.  This nurse and tech Sam attempted again to change patient into just a gown, that EMS stated his clothes were wet when they found him. Pt still refusing to change out of clothes

## 2018-06-15 NOTE — ED Notes (Addendum)
Patient ambulated with difficulty and assistance from two staff members. Unsteady gait.

## 2018-06-15 NOTE — H&P (Addendum)
TRH H&P   Patient Demographics:    Timothy Duncan, is a 77 y.o. male  MRN: [id number]   DOB - 01-Feb-1941  Admit Date - 06/14/2018  Outpatient Primary MD for the patient is Caren Macadam, MD  Referring MD:  Outpatient Specialists:  none  Patient coming from:  home  Chief Complaint  Patient presents with  . Fall      HPI:    Timothy Duncan  is a 77 y.o. 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  History provided from the chart as patient does not recall the incident.  He reports that he had frequent falls recently and 1 week back fell and landed on his buttocks.  Denies sustaining any head trauma or loss of consciousness.  Reportedly patient was not seen for almost 24 hours.  Patient was evaluated in the ED 3 days ago for fall and unable to get up on his own.  Family also concerned about recent confusion with concern for dementia.  Son was in the process of obtaining healthcare and financial power of attorney and make living arrangements.  He was seen by PT in the ED who recommended SNF but patient refused. No recent fevers, patient denies any headache, blurred vision, dizziness, chest pain, palpitations, shortness of breath, abdominal pain, nausea, vomiting, bowel or urinary symptoms.  (Patient is a poor historian and unable to provide much information)  Vitals in the ED were stable.  Blood work showed WBC of 14.8K, normal hemoglobin and platelets.  Sodium of 148, potassium of 3.3, normal renal function, CK of 714.  UA unremarkable for infection.  X-ray of the spine and pelvis did not show any acute fractures.  CT of the head was unremarkable for acute findings. Patient was found to be unable to ambulate.  Given 1 L normal saline bolus,  potassium chloride and placed on IV normal saline.  Hospitalist consulted for observation to medical floor for hyponatremia, inability to ambulate and elevated CK.     Review of systems:    As outlined in HPI.  Patient unable to provide enough history and has some confusion.    With Past History of the following :    Past Medical History:  Diagnosis Date  . DVT (deep venous thrombosis) ([place]) 07/2015   RLE  . ED (erectile dysfunction)   . Hypertension   . Pulmonary embolism ([place])   . TIA (transient ischemic attack)       Past Surgical History:  Procedure Laterality Date  . APPENDECTOMY    . CATARACT EXTRACTION W/ INTRAOCULAR LENS  IMPLANT, BILATERAL Bilateral   . COLONOSCOPY    . LUMBAR LAMINECTOMY/DECOMPRESSION MICRODISCECTOMY Right 05/03/2015   Procedure: Microdiscectomy - Lumbar four-Lumbar five - right ;  Surgeon: Eustace Moore, MD;  Location: [facility] NEURO ORS;  Service: Neurosurgery;  Laterality: Right;  . TONSILLECTOMY    . WOUND EXPLORATION N/A 06/06/2015   Procedure: Presumed discitis, Lumbar exploration,Repeat discectomy Lumbar four-five with deep tissue culture;  Surgeon: Eustace Moore, MD;  Location: [facility] NEURO ORS;  Service: Neurosurgery;  Laterality: N/A;      Social History:     Social History   Tobacco Use  . Smoking status: Former Smoker    Packs/day: 3.00    Years: 30.00    Pack years: 90.00    Types: Cigarettes  . Smokeless tobacco: Former Systems developer    Types: Snuff, Chew  . Tobacco comment: "quit smoking in the 1980s; used chew and snuff off and on, none for awhile"  Substance Use Topics  . Alcohol use: No    Alcohol/week: 0.0 standard drinks    Comment: 03/31/2016 "no drinking since 06/22/2005"     Lives -home alone  Mobility -independent previously     Family History :     Family History  Problem Relation Age of Onset  . Early death Father        MVA  . Alcohol abuse Maternal Grandfather       Home Medications:   Prior to Admission  medications   Medication Sig Start Date End Date Taking? Authorizing Provider  sildenafil (REVATIO) 20 MG tablet Take 1 tablet (20 mg total) by mouth at bedtime as needed. Patient not taking: Reported on 05/05/2018 04/28/17   Dorena Cookey, MD  Hyzaar 12.5-20 mg daily Lipitor 40 mg daily Aspirin 81 mg daily   Allergies:    No Known Allergies   Physical Exam:   Vitals  Blood pressure (!) 159/72, pulse 78, temperature 97.9 F (36.6 C), temperature source Oral, resp. rate 20, height 5\' 10"  (1.778 m), weight 104.3 kg, SpO2 96 %.   General: Elderly male lying in bed in no acute distress, HEENT: He was reactive bilaterally, EOMI, no pallor, no icterus, moist mucosa, supple neck, no cervical lymphadenopathy Chest: Clear to auscultation bilaterally, no added sound CVs: Normal S1-S2, no murmurs or gallops GI: Soft, nondistended, nontender, bowel sounds present Musculoskeletal: Warm, no edema, CNS: Alert and oriented x2 (has some confusion as to date), moving all extremities, normal strength in all extremities, nonfocal.   Data Review:    CBC Recent Labs  Lab 06/14/18 2355 06/15/18 0526  WBC 14.8* 12.2*  HGB 16.7 14.8  HCT 51.3 46.2  PLT 215 186  MCV 96.2 97.7  MCH 31.3 31.3  MCHC 32.6 32.0  RDW 13.7 13.8  LYMPHSABS 1.8 1.7  MONOABS 1.2* 0.9  EOSABS 0.1 0.2  BASOSABS 0.1 0.1   ------------------------------------------------------------------------------------------------------------------  Chemistries  Recent Labs  Lab 06/14/18 2355 06/15/18 0526  NA 148* 146*  K 3.3* 3.9  CL 111 116*  CO2 23 21*  GLUCOSE 123* 159*  BUN 18 17  CREATININE 1.08 0.94  CALCIUM 9.2 8.5*  AST 33  --   ALT 19  --   ALKPHOS 61  --   BILITOT 2.6*  --    ------------------------------------------------------------------------------------------------------------------ estimated creatinine clearance is 79.6 mL/min (by C-G formula based on SCr of 0.94  mg/dL). ------------------------------------------------------------------------------------------------------------------ No results for input(s): TSH, T4TOTAL, T3FREE, THYROIDAB in the last 72 hours.  Invalid input(s): FREET3  Coagulation profile No results for input(s): INR, PROTIME in the last 168 hours. ------------------------------------------------------------------------------------------------------------------- No results for input(s): DDIMER in the last 72 hours. -------------------------------------------------------------------------------------------------------------------  Cardiac Enzymes No results for  input(s): CKMB, TROPONINI, MYOGLOBIN in the last 168 hours.  Invalid input(s): CK ------------------------------------------------------------------------------------------------------------------ No results found for: BNP   ---------------------------------------------------------------------------------------------------------------  Urinalysis    Component Value Date/Time   COLORURINE YELLOW 06/15/2018 0126   APPEARANCEUR CLEAR 06/15/2018 0126   LABSPEC 1.014 06/15/2018 0126   PHURINE 6.0 06/15/2018 0126   GLUCOSEU NEGATIVE 06/15/2018 0126   GLUCOSEU NEGATIVE 05/05/2018 1237   HGBUR SMALL (A) 06/15/2018 0126   HGBUR negative 04/19/2009 1004   BILIRUBINUR NEGATIVE 06/15/2018 0126   BILIRUBINUR n 04/28/2017 1426   KETONESUR 20 (A) 06/15/2018 0126   PROTEINUR NEGATIVE 06/15/2018 0126   UROBILINOGEN 0.2 05/05/2018 1237   NITRITE NEGATIVE 06/15/2018 0126   LEUKOCYTESUR NEGATIVE 06/15/2018 0126    ----------------------------------------------------------------------------------------------------------------   Imaging Results:    Dg Chest 2 View  Result Date: 06/15/2018 CLINICAL DATA:  Shortness of breath. Unwitnessed fall. EXAM: CHEST - 2 VIEW COMPARISON:  Radiographs 04/25/2016, thoracic spine radiographs 06/11/2018 FINDINGS: Lower lung volumes from  prior exam. Mild cardiomegaly is similar allowing for differences in technique. No focal airspace disease, pulmonary edema, pleural effusion or pneumothorax. No acute osseous abnormalities are seen. IMPRESSION: Low lung volumes without acute abnormality. Electronically Signed   By: Keith Rake M.D.   On: 06/15/2018 00:59   Ct Head Wo Contrast  Result Date: 06/15/2018 CLINICAL DATA:  Altered level of consciousness (LOC), unexplained EXAM: CT HEAD WITHOUT CONTRAST TECHNIQUE: Contiguous axial images were obtained from the base of the skull through the vertex without intravenous contrast. COMPARISON:  04/25/2016 FINDINGS: Brain: Mild generalized atrophy. Advanced periventricular white matter changes consistent with chronic small vessel ischemia. Remote lacunar infarcts in the basal ganglia. No intracranial hemorrhage, mass effect, or midline shift. No hydrocephalus. The basilar cisterns are patent. No evidence of territorial infarct or acute ischemia. No extra-axial or intracranial fluid collection. Vascular: Atherosclerosis of skullbase vasculature without hyperdense vessel or abnormal calcification. Skull: No fracture or focal lesion. Sinuses/Orbits: Paranasal sinuses and mastoid air cells are clear. The visualized orbits are unremarkable. Bilateral cataract resection. Other: None. IMPRESSION: 1. No acute intracranial abnormality. 2. Unchanged atrophy and moderate to advanced chronic small vessel ischemia since 2017. Electronically Signed   By: Keith Rake M.D.   On: 06/15/2018 00:53    My personal review of EKG: Pending   Assessment & Plan:    Principal Problem: Acute metabolic encephalopathy ([place]) Possibly combination of hypernatremia with dehydration.  Family has some concerns about recent mental status changes and possible dementia. Head CT on admission unremarkable except for advanced chronic small vessel ischemia.  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  No signs of infection.  Check B12  and repeat TSH (was checked about 5-6 weeks back as outpatient).  Alcohol level was negative.  Check HIV antibody and RPR. Avoid narcotics or benzodiazepine.  Active problems Generalized weakness and failure to thrive/difficult ambulation Was seen by PT 3 days back in the ED and recommend SNF which patient declined.  He is unable to ambulate at this time and will need repeat physical therapy.  Possible placement needed.    Hypernatremia Possibly from dehydration.  Monitor with IV fluids.  Improving and subsequent labs.  Hypokalemia Replenished  Rhabdomyolysis Secondary to fall and being on the floor for several hours.  Patient also on statin which will be discontinued.  Monitor with IV fluids.  History of CVA Resume aspirin.  Hold statin due to rhabdomyolysis.    DVT Prophylaxis: Subcu Lovenox  AM Labs Ordered, also please review Full Orders  Family Communication: Admission,  patients condition and plan of care including tests being ordered have been discussed with the patient at bedside.  No family present   Code Status full code  Likely DC to SNF, pending PT evaluation  Condition: Fair  Consults called: None  Admission status: Observation  Time spent in minutes : 50   Brently Voorhis M.D on 06/15/2018 at 8:26 AM  Between 7am to 7pm - Pager - [phone]. After 7pm go to [email or web address] - password [facility]  [facility] - Office  [phone]

## 2018-06-15 NOTE — ED Notes (Signed)
Pt given orange juice.

## 2018-06-15 NOTE — ED Notes (Signed)
Pt reminded that a urine sample is needed 

## 2018-06-15 NOTE — Plan of Care (Signed)
[name] is a 77 y/o with pmh of CVA in 03/2016, HTN, HLD, DVT no longer on Coumadin; who presents after being found by family on the floor.  Upon admission patient was found to be afebrile, pulse 93-1 02, respirations 16-28, and all other vital signs maintained.  Labs revealed WBC 14.8, sodium 148, potassium 3.3, BUN 18, creatinine 1.08, and CK 714.  Urinalysis showing clear signs of infection.  X-rays of the spine and pelvis did not show any acute fractures.  CT scan of the head did not reveal any acute abnormalities.  Patient was noted to need significant assistance with ambulation.  TRH called to admit for hypernatremia and inability to ambulate.  Patient was given 1 L normal saline IV fluid bolus, 40 mEq of potassium chloride, and then placed on a rate of saline at 125 mL/h.  Accepted to a telemetry bed as observation.

## 2018-06-15 NOTE — ED Notes (Signed)
Daughter in law Markel Kurtenbach [phone]

## 2018-06-15 NOTE — ED Notes (Signed)
ED TO INPATIENT HANDOFF REPORT  Name/Age/Gender Timothy Duncan 77 y.o. male  Code Status Code Status History    Date Active Date Inactive Code Status Order ID Comments User Context   03/31/2016 0215 04/01/2016 1651 Full Code [id number]  Lily Kocher, MD ED   06/06/2015 1901 06/09/2015 0014 Full Code [id number]  Eustace Moore, MD Inpatient   05/03/2015 1820 05/04/2015 1525 Full Code [id number]  Eustace Moore, MD Inpatient      Home/SNF/Other Home  Chief Complaint Fall  Level of Care/Admitting Diagnosis ED Disposition    ED Disposition Condition [place]: [facility] [[id number]]  Level of Care: Telemetry [5]  Admit to tele based on following criteria: Complex arrhythmia (Bradycardia/Tachycardia)  Diagnosis: Hypernatremia [315400]  Admitting Physician: Norval Morton [[id number]]  Attending Physician: Norval Morton [[id number]]  PT Class (Do Not Modify): Observation [104]  PT Acc Code (Do Not Modify): Observation [10022]       Medical History Past Medical History:  Diagnosis Date  . DVT (deep venous thrombosis) ([place]) 07/2015   RLE  . ED (erectile dysfunction)   . Hypertension   . Pulmonary embolism ([place])   . TIA (transient ischemic attack)     Allergies No Known Allergies  IV Location/Drains/Wounds Patient Lines/Drains/Airways Status   Active Line/Drains/Airways    Name:   Placement date:   Placement time:   Site:   Days:   Peripheral IV 06/14/18 Right Antecubital   06/14/18    2354    Antecubital   1          Labs/Imaging Results for orders placed or performed during the hospital encounter of 06/14/18 (from the past 48 hour(s))  CBC with Differential/Platelet     Status: Abnormal   Collection Time: 06/14/18 11:55 PM  Result Value Ref Range   WBC 14.8 (H) 4.0 - 10.5 K/uL   RBC 5.33 4.22 - 5.81 MIL/uL   Hemoglobin 16.7 13.0 - 17.0 g/dL   HCT 51.3 39.0 - 52.0 %   MCV 96.2 80.0 - 100.0 fL   MCH 31.3 26.0 - 34.0 pg   MCHC  32.6 30.0 - 36.0 g/dL   RDW 13.7 11.5 - 15.5 %   Platelets 215 150 - 400 K/uL   nRBC 0.0 0.0 - 0.2 %   Neutrophils Relative % 77 %   Neutro Abs 11.5 (H) 1.7 - 7.7 K/uL   Lymphocytes Relative 12 %   Lymphs Abs 1.8 0.7 - 4.0 K/uL   Monocytes Relative 8 %   Monocytes Absolute 1.2 (H) 0.1 - 1.0 K/uL   Eosinophils Relative 1 %   Eosinophils Absolute 0.1 0.0 - 0.5 K/uL   Basophils Relative 1 %   Basophils Absolute 0.1 0.0 - 0.1 K/uL   Immature Granulocytes 1 %   Abs Immature Granulocytes 0.07 0.00 - 0.07 K/uL    Comment: Performed at [facility], [place] [address], [place],  [address]  Comprehensive metabolic panel     Status: Abnormal   Collection Time: 06/14/18 11:55 PM  Result Value Ref Range   Sodium 148 (H) 135 - 145 mmol/L   Potassium 3.3 (L) 3.5 - 5.1 mmol/L   Chloride 111 98 - 111 mmol/L   CO2 23 22 - 32 mmol/L   Glucose, Bld 123 (H) 70 - 99 mg/dL   BUN 18 8 - 23 mg/dL   Creatinine, Ser 1.08 0.61 - 1.24 mg/dL   Calcium 9.2 8.9 -  10.3 mg/dL   Total Protein 7.9 6.5 - 8.1 g/dL   Albumin 4.4 3.5 - 5.0 g/dL   AST 33 15 - 41 U/L   ALT 19 0 - 44 U/L   Alkaline Phosphatase 61 38 - 126 U/L   Total Bilirubin 2.6 (H) 0.3 - 1.2 mg/dL   GFR calc non Af Amer >60 >60 mL/min   GFR calc Af Amer >60 >60 mL/min   Anion gap 14 5 - 15    Comment: Performed at [facility], [place] [address] [place], [place] [address]  CK     Status: Abnormal   Collection Time: 06/14/18 11:55 PM  Result Value Ref Range   Total CK 714 (H) 49 - 397 U/L    Comment: Performed at [facility], [place] [address], [place], [place] [address]  I-stat troponin, ED     Status: None   Collection Time: 06/15/18 12:07 AM  Result Value Ref Range   Troponin i, poc 0.03 0.00 - 0.08 ng/mL   Comment 3            Comment: Due to the release kinetics of cTnI, a negative result within the first hours of the onset of symptoms does not rule out myocardial infarction  with certainty. If myocardial infarction is still suspected, repeat the test at appropriate intervals.   Urinalysis, Routine w reflex microscopic     Status: Abnormal   Collection Time: 06/15/18  1:26 AM  Result Value Ref Range   Color, Urine YELLOW YELLOW   APPearance CLEAR CLEAR   Specific Gravity, Urine 1.014 1.005 - 1.030   pH 6.0 5.0 - 8.0   Glucose, UA NEGATIVE NEGATIVE mg/dL   Hgb urine dipstick SMALL (A) NEGATIVE   Bilirubin Urine NEGATIVE NEGATIVE   Ketones, ur 20 (A) NEGATIVE mg/dL   Protein, ur NEGATIVE NEGATIVE mg/dL   Nitrite NEGATIVE NEGATIVE   Leukocytes, UA NEGATIVE NEGATIVE   RBC / HPF 0-5 0 - 5 RBC/hpf   WBC, UA 0-5 0 - 5 WBC/hpf   Bacteria, UA NONE SEEN NONE SEEN   Squamous Epithelial / LPF 0-5 0 - 5   Mucus PRESENT    Hyaline Casts, UA PRESENT     Comment: Performed at [facility], [place] [address] [place], [place] [address]  CK     Status: Abnormal   Collection Time: 06/15/18  5:26 AM  Result Value Ref Range   Total CK 576 (H) 49 - 397 U/L    Comment: Performed at [facility], [place] [address] [place],  [address]  Basic metabolic panel     Status: Abnormal   Collection Time: 06/15/18  5:26 AM  Result Value Ref Range   Sodium 146 (H) 135 - 145 mmol/L   Potassium 3.9 3.5 - 5.1 mmol/L   Chloride 116 (H) 98 - 111 mmol/L   CO2 21 (L) 22 - 32 mmol/L   Glucose, Bld 159 (H) 70 - 99 mg/dL   BUN 17 8 - 23 mg/dL   Creatinine, Ser 0.94 0.61 - 1.24 mg/dL   Calcium 8.5 (L) 8.9 - 10.3 mg/dL   GFR calc non Af Amer >60 >60 mL/min   GFR calc Af Amer >60 >60 mL/min   Anion gap 9 5 - 15    Comment: Performed at [facility], [place] [address], [place],  [address]  CBC with Differential/Platelet     Status: Abnormal   Collection Time: 06/15/18  5:26 AM  Result Value Ref Range   WBC 12.2 (H) 4.0 - 10.5 K/uL   RBC 4.73 4.22 - 5.81 MIL/uL   Hemoglobin 14.8 13.0 - 17.0 g/dL   HCT 46.2 39.0 - 52.0 %    MCV 97.7 80.0 - 100.0 fL   MCH 31.3 26.0 - 34.0 pg   MCHC 32.0 30.0 - 36.0 g/dL   RDW 13.8 11.5 - 15.5 %   Platelets 186 150 - 400 K/uL   nRBC 0.0 0.0 - 0.2 %   Neutrophils Relative % 76 %   Neutro Abs 9.3 (H) 1.7 - 7.7 K/uL   Lymphocytes Relative 14 %   Lymphs Abs 1.7 0.7 - 4.0 K/uL   Monocytes Relative 7 %   Monocytes Absolute 0.9 0.1 - 1.0 K/uL   Eosinophils Relative 2 %   Eosinophils Absolute 0.2 0.0 - 0.5 K/uL   Basophils Relative 0 %   Basophils Absolute 0.1 0.0 - 0.1 K/uL   Immature Granulocytes 1 %   Abs Immature Granulocytes 0.06 0.00 - 0.07 K/uL    Comment: Performed at [facility], [place] [address] [place], [place] [address]  Ethanol     Status: None   Collection Time: 06/15/18  5:26 AM  Result Value Ref Range   Alcohol, Ethyl (B) <10 <10 mg/dL    Comment: (NOTE) Lowest detectable limit for serum alcohol is 10 mg/dL. For medical purposes only. Performed at [facility], [place] [address], [place], [place] [address]   Vitamin B12     Status: None   Collection Time: 06/15/18  8:40 AM  Result Value Ref Range   Vitamin B-12 383 180 - 914 pg/mL    Comment: (NOTE) This assay is not validated for testing neonatal or myeloproliferative syndrome specimens for Vitamin B12 levels. Performed at [facility], [place] [address], [place], [place] [address]   TSH     Status: None   Collection Time: 06/15/18  8:40 AM  Result Value Ref Range   TSH 2.165 0.350 - 4.500 uIU/mL    Comment: Performed by a 3rd Generation assay with a functional sensitivity of <=0.01 uIU/mL. Performed at [facility], [place] [address] [place], [place] [address]    Dg Chest 2 View  Result Date: 06/15/2018 CLINICAL DATA:  Shortness of breath. Unwitnessed fall. EXAM: CHEST - 2 VIEW COMPARISON:  Radiographs 04/25/2016, thoracic spine radiographs 06/11/2018 FINDINGS: Lower lung volumes from prior exam. Mild cardiomegaly is similar  allowing for differences in technique. No focal airspace disease, pulmonary edema, pleural effusion or pneumothorax. No acute osseous abnormalities are seen. IMPRESSION: Low lung volumes without acute abnormality. Electronically Signed   By: Keith Rake M.D.   On: 06/15/2018 00:59   Ct Head Wo Contrast  Result Date: 06/15/2018 CLINICAL DATA:  Altered level of consciousness (LOC), unexplained EXAM: CT HEAD WITHOUT CONTRAST TECHNIQUE: Contiguous axial images were obtained from the base of the skull through the vertex without intravenous contrast. COMPARISON:  04/25/2016 FINDINGS: Brain: Mild generalized atrophy. Advanced periventricular white matter changes consistent with chronic small vessel ischemia. Remote lacunar infarcts in the basal ganglia. No intracranial hemorrhage, mass effect, or midline shift. No hydrocephalus. The basilar cisterns are patent. No evidence of territorial infarct or acute ischemia. No extra-axial or intracranial fluid collection. Vascular: Atherosclerosis of skullbase vasculature without hyperdense vessel or abnormal calcification. Skull: No fracture or focal lesion. Sinuses/Orbits: Paranasal sinuses and mastoid air cells are clear. The visualized orbits are unremarkable. Bilateral  cataract resection. Other: None. IMPRESSION: 1. No acute intracranial abnormality. 2. Unchanged atrophy and moderate to advanced chronic small vessel ischemia since 2017. Electronically Signed   By: Keith Rake M.D.   On: 06/15/2018 00:53   None  Pending Labs Unresulted Labs (From admission, onward)    Start     Ordered   06/15/18 0920  HIV Antibody (routine testing w rflx)  Add-on,   R     06/15/18 0919   06/15/18 0920  RPR  Add-on,   R     06/15/18 0919   Signed and Held  CBC  (enoxaparin (LOVENOX)    CrCl >/= 30 ml/min)  Once,   R    Comments:  Baseline for enoxaparin therapy IF NOT ALREADY DRAWN.  Notify MD if PLT < 100 K.    Signed and Held   Signed and Held  Creatinine, serum   (enoxaparin (LOVENOX)    CrCl >/= 30 ml/min)  Once,   R    Comments:  Baseline for enoxaparin therapy IF NOT ALREADY DRAWN.    Signed and Held   Signed and Held  Creatinine, serum  (enoxaparin (LOVENOX)    CrCl >/= 30 ml/min)  Weekly,   R    Comments:  while on enoxaparin therapy    Signed and Held   Signed and Held  Comprehensive metabolic panel  Tomorrow morning,   R     Signed and Held   Signed and Held  CBC  Tomorrow morning,   R     Signed and Held          Vitals/Pain Today's Vitals   06/15/18 1200 06/15/18 1247 06/15/18 1300 06/15/18 1400  BP: 122/68   (!) 152/76  Pulse:    78  Resp: (!) 0 20    Temp:    97.9 F (36.6 C)  TempSrc:    Oral  SpO2: (!) 85%     Weight:      Height:      PainSc:   0-No pain     Isolation Precautions No active isolations  Medications Medications  0.9 %  sodium chloride infusion ( Intravenous Stopped 06/15/18 0906)  sodium chloride 0.9 % bolus 1,000 mL (0 mLs Intravenous Stopped 06/15/18 0056)  potassium chloride SA (K-DUR,KLOR-CON) CR tablet 40 mEq (40 mEq Oral Given 06/15/18 0410)    Mobility walks with device

## 2018-06-15 NOTE — ED Notes (Signed)
Pt is alert and oriented x 4 and is verbally responsive. Pt denies pain at this time.  

## 2018-06-16 DIAGNOSIS — E870 Hyperosmolality and hypernatremia: Secondary | ICD-10-CM | POA: Diagnosis not present

## 2018-06-16 LAB — RPR: RPR Ser Ql: NONREACTIVE

## 2018-06-16 LAB — COMPREHENSIVE METABOLIC PANEL
ALT: 14 U/L (ref 0–44)
AST: 23 U/L (ref 15–41)
Albumin: 3.2 g/dL — ABNORMAL LOW (ref 3.5–5.0)
Alkaline Phosphatase: 48 U/L (ref 38–126)
Anion gap: 7 (ref 5–15)
BUN: 13 mg/dL (ref 8–23)
CO2: 22 mmol/L (ref 22–32)
Calcium: 8.1 mg/dL — ABNORMAL LOW (ref 8.9–10.3)
Chloride: 114 mmol/L — ABNORMAL HIGH (ref 98–111)
Creatinine, Ser: 0.89 mg/dL (ref 0.61–1.24)
GFR calc Af Amer: >60 mL/min (ref >60)
GFR calc non Af Amer: >60 mL/min (ref >60)
Glucose, Bld: 101 mg/dL — ABNORMAL HIGH (ref 70–99)
Potassium: 3.4 mmol/L — ABNORMAL LOW (ref 3.5–5.1)
Sodium: 143 mmol/L (ref 135–145)
Total Bilirubin: 1.6 mg/dL — ABNORMAL HIGH (ref 0.3–1.2)
Total Protein: 6.2 g/dL — ABNORMAL LOW (ref 6.5–8.1)

## 2018-06-16 LAB — CBC
HCT: 42.4 % (ref 39.0–52.0)
HEMOGLOBIN: 13.4 g/dL (ref 13.0–17.0)
MCH: 30.2 pg (ref 26.0–34.0)
MCHC: 31.6 g/dL (ref 30.0–36.0)
MCV: 95.5 fL (ref 80.0–100.0)
Platelets: 172 10*3/uL (ref 150–400)
RBC: 4.44 MIL/uL (ref 4.22–5.81)
RDW: 13.7 % (ref 11.5–15.5)
WBC: 11.8 10*3/uL — ABNORMAL HIGH (ref 4.0–10.5)
nRBC: 0 % (ref 0.0–0.2)

## 2018-06-16 LAB — MAGNESIUM: Magnesium: 2.1 mg/dL (ref 1.7–2.4)

## 2018-06-16 LAB — HIV ANTIBODY (ROUTINE TESTING W REFLEX): HIV Screen 4th Generation wRfx: NONREACTIVE

## 2018-06-16 MED ORDER — POTASSIUM CHLORIDE CRYS ER 20 MEQ PO TBCR
40.0000 meq | EXTENDED_RELEASE_TABLET | Freq: Once | ORAL | Status: AC
  Administered 2018-06-16: 40 meq via ORAL
  Filled 2018-06-16: qty 2

## 2018-06-16 NOTE — Evaluation (Signed)
Physical Therapy Evaluation Patient Details Name: Timothy Duncan MRN: [id number] DOB: 06/06/1941 Today's Date: 06/16/2018   History of Present Illness  77 yo male admitted with acute metabolic encephalopathy, hypernatremia, falls. Hx of DVT, CVA, PE, TIA, lumbar surgery  Clinical Impression  On eval, pt required Mod-Max assist for mobility. He was able to stand and take a few steps in the room with a RW. Pt presents with general weakness, decreased activity tolerance, and impaired gait and balance. Pt remains at high risk for falls. He is currently not able to safely manage at home alone. Discussed d/c plan-pt was not agreeable to ST rehab at [facility] during PT session. No family present during session. Recommend SNF at this time. Should pt refuse placement and choose to go home, he will need maximized [facility] services and 24 hour supervision/assist.     Follow Up Recommendations SNF    Equipment Recommendations  Rolling walker with 5" wheels    Recommendations for Other Services       Precautions / Restrictions Precautions Precautions: Fall Restrictions Weight Bearing Restrictions: No      Mobility  Bed Mobility Overal bed mobility: Needs Assistance Bed Mobility: Supine to Sit     Supine to sit: Max assist     General bed mobility comments: Assist for trunk and LEs. Utilized bedpad to scoot. Increased time. Multimodal cueing required.   Transfers Overall transfer level: Needs assistance Equipment used: Rolling walker (2 wheeled) Transfers: Sit to/from [facility] Sit to Stand: Mod assist;From elevated surface Stand pivot transfers: Mod assist       General transfer comment: x3. LOB x 2 posteriorly. 3rd time, pt was able to stand and maintain static standing balance with RW and therapist support.   Ambulation/Gait Ambulation/Gait assistance: Min assist; +2 safety/equipment Gait Distance (Feet): 7 Feet Assistive device: Rolling walker (2 wheeled) Gait  Pattern/deviations: Step-through pattern;Decreased stride length;Trunk flexed     General Gait Details: Pt is very unsteady and remains at risk for falls. Followed closely with recliner for short distance ambulation in room. Multimodal cueing required.   Stairs            Wheelchair Mobility    Modified Rankin (Stroke Patients Only)       Balance Overall balance assessment: Needs assistance;History of Falls         Standing balance support: Bilateral upper extremity supported Standing balance-Leahy Scale: Poor                               Pertinent Vitals/Pain Pain Assessment: No/denies pain    Home Living Family/patient expects to be discharged to:: Skilled nursing facility Living Arrangements: Alone Available Help at Discharge: Family;Available PRN/intermittently Type of Home: House Home Access: Stairs to enter Entrance Stairs-Rails: Right Entrance Stairs-Number of Steps: 3 Home Layout: Two level;Able to live on main level with bedroom/bathroom Home Equipment: None      Prior Function Level of Independence: Independent               Hand Dominance        Extremity/Trunk Assessment   Upper Extremity Assessment Upper Extremity Assessment: Generalized weakness    Lower Extremity Assessment Lower Extremity Assessment: Generalized weakness    Cervical / Trunk Assessment Cervical / Trunk Assessment: Normal  Communication   Communication: No difficulties  Cognition Arousal/Alertness: Awake/alert Behavior During Therapy: Impulsive Overall Cognitive Status: No family/caregiver present to determine baseline cognitive functioning  General Comments: Pt with decreased safety awareness and very unaware of deficits. Pt not listening to safety cues of PT. Unsure of baseline for pt.       General Comments      Exercises Total Joint Exercises Long Arc Quad: AROM;10 reps;Seated;Both Marching  in Standing: AROM;Both;10 reps;Seated   Assessment/Plan    PT Assessment Patient needs continued PT services  PT Problem List Decreased strength;Decreased balance;Decreased mobility;Decreased activity tolerance;Decreased cognition;Decreased knowledge of use of DME;Decreased safety awareness       PT Treatment Interventions Gait training;DME instruction;Functional mobility training;Balance training;Patient/family education;Therapeutic exercise;Therapeutic activities    PT Goals (Current goals can be found in the Care Plan section)  Acute Rehab PT Goals Patient Stated Goal: to go home PT Goal Formulation: With patient Time For Goal Achievement: 06/30/18 Potential to Achieve Goals: Fair    Frequency Min 3X/week   Barriers to discharge Decreased caregiver support      Co-evaluation               AM-PAC PT "6 Clicks" Mobility  Outcome Measure Help needed turning from your back to your side while in a flat bed without using bedrails?: A Lot Help needed moving from lying on your back to sitting on the side of a flat bed without using bedrails?: A Lot Help needed moving to and from a bed to a chair (including a wheelchair)?: A Lot Help needed standing up from a chair using your arms (e.g., wheelchair or bedside chair)?: A Lot Help needed to walk in hospital room?: A Lot Help needed climbing 3-5 steps with a railing? : Total 6 Click Score: 11    End of Session Equipment Utilized During Treatment: Gait belt Activity Tolerance: Patient tolerated treatment well Patient left: in chair;with call bell/phone within reach;with chair alarm set   PT Visit Diagnosis: Muscle weakness (generalized) (M62.81);Unsteadiness on feet (R26.81);Difficulty in walking, not elsewhere classified (R26.2);History of falling (Z91.81)    Time: 0263-7858 PT Time Calculation (min) (ACUTE ONLY): 22 min   Charges:   PT Evaluation $PT Eval Moderate Complexity: [place],  PT Acute Rehabilitation Services Pager: [phone] Office: [phone]

## 2018-06-16 NOTE — Progress Notes (Signed)
PROGRESS NOTE    Timothy Duncan  BHA:[id number] DOB: 08-15-1940 DOA: 06/14/2018 PCP: Caren Macadam, MD  Brief Narrative:  Timothy Duncan  is Timothy Duncan 77 y.o. 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  History provided from the chart as patient does not recall the incident.  He reports that he had frequent falls recently and 1 week back fell and landed on his buttocks.  Denies sustaining any head trauma or loss of consciousness.  Reportedly patient was not seen for almost 24 hours.  Patient was evaluated in the ED 3 days ago for fall and unable to get up on his own.  Family also concerned about recent confusion with concern for dementia.  Son was in the process of obtaining healthcare and financial power of attorney and make living arrangements.  He was seen by PT in the ED who recommended SNF but patient refused. No recent fevers, patient denies any headache, blurred vision, dizziness, chest pain, palpitations, shortness of breath, abdominal pain, nausea, vomiting, bowel or urinary symptoms.  (Patient is Timothy Duncan poor historian and unable to provide much information).  Assessment & Plan:   Principal Problem:   Hypernatremia Active Problems:   Essential hypertension   Acute metabolic encephalopathy   Hypokalemia   Rhabdomyolysis   Acute hypernatremia   Acute metabolic encephalopathy (HCC) Timothy Duncan&Ox2 today, no focal deficits appreciated  Likely some delirium and with concern about recent mental status changes from family, dementia is certainly possible Head CT with moderate to advanced chronic small vessel ischemia Normal TSH, B12, RPR, HIV Negative etoh level (apparently hx of heavy drinking, but no reported drinking for years per family) Delirium precautions Would likely benefit from outpatient neurology follow up for likely dementia  Generalized weakness and  failure to thrive  Difficulty Ambulating PT recommending SNF (able to stand with PT and take Timothy Duncan few steps with RW) Planning for SNF  Hypernatremia Improved  Hypokalemia Follow and replace as needed.  Follow magnesium.  Rhabdomyolysis Continue IVF Statin was d/c'd Follow repeat CK (UA with small Hb, 0-5 RBC, renal function appears at baseline)  History of CVA Resume aspirin.  Hold statin due to rhabdomyolysis.  DVT prophylaxis: lovenox Code Status: full  Family Communication: son over phone Disposition Plan: pending placement   Consultants:   none  Procedures:  none  Antimicrobials:  Anti-infectives (From admission, onward)   None     Subjective: Timothy Duncan&Ox2. Denies complaints today. He notes 2 falls recently.  Discussed with son.     Objective: Vitals:   06/15/18 1853 06/15/18 2240 06/16/18 0441 06/16/18 1528  BP:  (!) 145/76 139/68 (!) 152/76  Pulse:  79 76 83  Resp:  16 18   Temp:  98.4 F (36.9 C) 99.4 F (37.4 C) 97.6 F (36.4 C)  TempSrc:  Oral Oral Oral  SpO2: 96% 97% 94% 95%  Weight:      Height:        Intake/Output Summary (Last 24 hours) at 06/16/2018 1933 Last data filed at 06/16/2018 1700 Gross per 24 hour  Intake -  Output 850 ml  Net -850 ml   Filed Weights   06/14/18 2310  Weight: 104.3 kg    Examination:  General exam: Appears calm and comfortable  Respiratory system: Clear to auscultation. Respiratory effort normal. Cardiovascular system: S1 & S2 heard, RRR.  Gastrointestinal system: Abdomen is  nondistended, soft and nontender.  Central nervous system: Alert and disoriented. No focal neurological deficits.  Symmetric strength in upper and lower extremities.  Extremities: moving all extremities, no LEE Skin: No rashes, lesions or ulcers Psychiatry: Judgement and insight appear normal. Mood & affect appropriate.     Data Reviewed: I have personally reviewed following labs and imaging studies  CBC: Recent Labs  Lab  06/14/18 2355 06/15/18 0526 06/16/18 0611  WBC 14.8* 12.2* 11.8*  NEUTROABS 11.5* 9.3*  --   HGB 16.7 14.8 13.4  HCT 51.3 46.2 42.4  MCV 96.2 97.7 95.5  PLT 215 186 097   Basic Metabolic Panel: Recent Labs  Lab 06/14/18 2355 06/15/18 0526 06/16/18 0611  NA 148* 146* 143  K 3.3* 3.9 3.4*  CL 111 116* 114*  CO2 23 21* 22  GLUCOSE 123* 159* 101*  BUN 18 17 13   CREATININE 1.08 0.94 0.89  CALCIUM 9.2 8.5* 8.1*  MG  --   --  2.1   GFR: Estimated Creatinine Clearance: 84.1 mL/min (by C-G formula based on SCr of 0.89 mg/dL). Liver Function Tests: Recent Labs  Lab 06/14/18 2355 06/16/18 0611  AST 33 23  ALT 19 14  ALKPHOS 61 48  BILITOT 2.6* 1.6*  PROT 7.9 6.2*  ALBUMIN 4.4 3.2*   No results for input(s): LIPASE, AMYLASE in the last 168 hours. No results for input(s): AMMONIA in the last 168 hours. Coagulation Profile: No results for input(s): INR, PROTIME in the last 168 hours. Cardiac Enzymes: Recent Labs  Lab 06/14/18 2355 06/15/18 0526  CKTOTAL 714* 576*   BNP (last 3 results) No results for input(s): PROBNP in the last 8760 hours. HbA1C: No results for input(s): HGBA1C in the last 72 hours. CBG: No results for input(s): GLUCAP in the last 168 hours. Lipid Profile: No results for input(s): CHOL, HDL, LDLCALC, TRIG, CHOLHDL, LDLDIRECT in the last 72 hours. Thyroid Function Tests: Recent Labs    06/15/18 0840  TSH 2.165   Anemia Panel: Recent Labs    06/15/18 0840  VITAMINB12 383   Sepsis Labs: No results for input(s): PROCALCITON, LATICACIDVEN in the last 168 hours.  No results found for this or any previous visit (from the past 240 hour(s)).       Radiology Studies: Dg Chest 2 View  Result Date: 06/15/2018 CLINICAL DATA:  Shortness of breath. Unwitnessed fall. EXAM: CHEST - 2 VIEW COMPARISON:  Radiographs 04/25/2016, thoracic spine radiographs 06/11/2018 FINDINGS: Lower lung volumes from prior exam. Mild cardiomegaly is similar allowing  for differences in technique. No focal airspace disease, pulmonary edema, pleural effusion or pneumothorax. No acute osseous abnormalities are seen. IMPRESSION: Low lung volumes without acute abnormality. Electronically Signed   By: Keith Rake M.D.   On: 06/15/2018 00:59   Ct Head Wo Contrast  Result Date: 06/15/2018 CLINICAL DATA:  Altered level of consciousness (LOC), unexplained EXAM: CT HEAD WITHOUT CONTRAST TECHNIQUE: Contiguous axial images were obtained from the base of the skull through the vertex without intravenous contrast. COMPARISON:  04/25/2016 FINDINGS: Brain: Mild generalized atrophy. Advanced periventricular white matter changes consistent with chronic small vessel ischemia. Remote lacunar infarcts in the basal ganglia. No intracranial hemorrhage, mass effect, or midline shift. No hydrocephalus. The basilar cisterns are patent. No evidence of territorial infarct or acute ischemia. No extra-axial or intracranial fluid collection. Vascular: Atherosclerosis of skullbase vasculature without hyperdense vessel or abnormal calcification. Skull: No fracture or focal lesion. Sinuses/Orbits: Paranasal sinuses and mastoid air cells are clear. The visualized  orbits are unremarkable. Bilateral cataract resection. Other: None. IMPRESSION: 1. No acute intracranial abnormality. 2. Unchanged atrophy and moderate to advanced chronic small vessel ischemia since 2017. Electronically Signed   By: Keith Rake M.D.   On: 06/15/2018 00:53        Scheduled Meds: . aspirin  81 mg Oral Daily  . enoxaparin (LOVENOX) injection  40 mg Subcutaneous Q24H  . lisinopril  20 mg Oral Daily   Continuous Infusions: . sodium chloride 125 mL/hr at 06/16/18 1243     LOS: 0 days    Time spent: over 30 min    Fayrene Helper, MD [facility] Pager [phone]  If 7PM-7AM, please contact night-coverage [email or web address] Password [facility] 06/16/2018, 7:33 PM

## 2018-06-16 NOTE — Care Management Obs Status (Signed)
[place] NOTIFICATION   Patient Details  Name: Timothy Duncan MRN: [id number] Date of Birth: [birth year]   Medicare Observation Status Notification Given:  Yes    MahabirJuliann Pulse, RN 06/16/2018, 12:40 PM

## 2018-06-16 NOTE — Clinical Social Work Note (Signed)
Clinical Social Work Assessment  Patient Details  Name: Timothy Duncan MRN: [id number] Date of Birth: 1940-11-18  Date of referral:  06/16/18               Reason for consult:  Facility Placement, Discharge Planning                Permission sought to share information with:  Family Supports Permission granted to share information::  Yes, Verbal Permission Granted  Name::     Robbin Escher  Agency::  snf  Relationship::  son  Contact Information:  [phone]  Housing/Transportation Living arrangements for the past 2 months:  [place] of Information:  Adult Children Patient Interpreter Needed:  None Criminal Activity/Legal Involvement Pertinent to Current Situation/Hospitalization:  No - Comment as needed Significant Relationships:  Adult Children, Other Family Members Lives with:  Self Do you feel safe going back to the place where you live?  Yes Need for family participation in patient care:  Yes (Comment)  Care giving concerns:  Patient only cognitive x2. CSW contacted patients son via phone. Patient is from home alone per son and patient has support from family son in the area.   Social Worker assessment / plan:  CSW spoke with patients son Timothy Duncan via phone. Timothy Duncan is very concern about patient returning home by himself. Timothy Duncan is agreeable with patient going to rehab to get assistance. Timothy Duncan stated that he is also looking into getting patient into a ALF. CSW explained the long process of ALF and encouraged family to start that process now. Timothy Duncan stated that he is also trying to get POA over father and has contacted a lawyer that will be meeting patient tomorrow. CSW went over the process of SNF and the process of getting authorization through insurance. Timothy Duncan gave CSW verbal permission to proceed with SNF placement on behalf of patient  Employment status:  Retired Nurse, adult PT Recommendations:  [place] / Referral to community resources:  [place]  Patient/Family's Response to care:  Family supportive of patient. Family appreciative of CSW role in care and stated they are grateful for the care patient has received so far.  Patient/Family's Understanding of and Emotional Response to Diagnosis, Current Treatment, and Prognosis:  Family agreeable to discharge plan to short term rehab   Emotional Assessment Appearance:  Appears stated age Attitude/Demeanor/Rapport:  Unable to Assess(pt only cognitive x 2) Affect (typically observed):  Unable to Assess(pt only cognitive x 2) Orientation:  Oriented to Self, Oriented to Place Alcohol / Substance use:  Not Applicable Psych involvement (Current and /or in the community):  No (Comment)  Discharge Needs  Concerns to be addressed:  Care Coordination Readmission within the last 30 days:  Yes Current discharge risk:  Dependent with Mobility Barriers to Discharge:  Ship broker, Continued Medical Work up   [employer], LCSW 06/16/2018, 4:10 PM

## 2018-06-17 ENCOUNTER — Observation Stay (HOSPITAL_COMMUNITY): Payer: Medicare HMO

## 2018-06-17 DIAGNOSIS — E870 Hyperosmolality and hypernatremia: Secondary | ICD-10-CM | POA: Diagnosis not present

## 2018-06-17 DIAGNOSIS — K760 Fatty (change of) liver, not elsewhere classified: Secondary | ICD-10-CM | POA: Diagnosis not present

## 2018-06-17 LAB — COMPREHENSIVE METABOLIC PANEL
ALT: 13 U/L (ref 0–44)
AST: 20 U/L (ref 15–41)
Albumin: 3.4 g/dL — ABNORMAL LOW (ref 3.5–5.0)
Alkaline Phosphatase: 50 U/L (ref 38–126)
Anion gap: 8 (ref 5–15)
BUN: 9 mg/dL (ref 8–23)
CO2: 21 mmol/L — ABNORMAL LOW (ref 22–32)
Calcium: 8.3 mg/dL — ABNORMAL LOW (ref 8.9–10.3)
Chloride: 112 mmol/L — ABNORMAL HIGH (ref 98–111)
Creatinine, Ser: 0.79 mg/dL (ref 0.61–1.24)
GFR calc non Af Amer: >60 mL/min (ref >60)
Glucose, Bld: 96 mg/dL (ref 70–99)
Potassium: 4.1 mmol/L (ref 3.5–5.1)
Sodium: 141 mmol/L (ref 135–145)
Total Bilirubin: 2.7 mg/dL — ABNORMAL HIGH (ref 0.3–1.2)
Total Protein: 6 g/dL — ABNORMAL LOW (ref 6.5–8.1)

## 2018-06-17 LAB — CBC
HCT: 42.1 % (ref 39.0–52.0)
Hemoglobin: 13.4 g/dL (ref 13.0–17.0)
MCH: 31 pg (ref 26.0–34.0)
MCHC: 31.8 g/dL (ref 30.0–36.0)
MCV: 97.5 fL (ref 80.0–100.0)
Platelets: 171 10*3/uL (ref 150–400)
RBC: 4.32 MIL/uL (ref 4.22–5.81)
RDW: 13.5 % (ref 11.5–15.5)
WBC: 11.7 10*3/uL — ABNORMAL HIGH (ref 4.0–10.5)
nRBC: 0 % (ref 0.0–0.2)

## 2018-06-17 LAB — BILIRUBIN, FRACTIONATED(TOT/DIR/INDIR)
BILIRUBIN TOTAL: 2.5 mg/dL — AB (ref 0.3–1.2)
Bilirubin, Direct: 0.5 mg/dL — ABNORMAL HIGH (ref 0.0–0.2)
Indirect Bilirubin: 2 mg/dL — ABNORMAL HIGH (ref 0.3–0.9)

## 2018-06-17 LAB — MAGNESIUM: Magnesium: 2.4 mg/dL (ref 1.7–2.4)

## 2018-06-17 LAB — CK: Total CK: 155 U/L (ref 49–397)

## 2018-06-17 MED ORDER — LIP MEDEX EX OINT
TOPICAL_OINTMENT | CUTANEOUS | Status: AC
  Administered 2018-06-17: 1
  Filled 2018-06-17: qty 7

## 2018-06-17 NOTE — NC FL2 (Signed)
[place] LEVEL OF CARE SCREENING TOOL     IDENTIFICATION  Patient Name: Timothy Duncan Birthdate: 1940-07-11 Sex: male Admission Date (Current Location): 06/14/2018  [facility] and [place] Number:  Herbalist and Address:  [facility],  [place], [place]      Provider Number: [id number]  Attending Physician Name and Address:  Elodia Florence., *  Relative Name and Phone Number:  Alontae Chaloux, [phone]    Current Level of Care: Hospital Recommended Level of Care: [place] Prior Approval Number:    Date Approved/Denied:   PASRR Number: [id number]  Discharge Plan: SNF    Current Diagnoses: Patient Active Problem List   Diagnosis Date Noted  . Hypernatremia 06/15/2018  . Acute metabolic encephalopathy 90/30/0923  . Hypokalemia 06/15/2018  . Rhabdomyolysis 06/15/2018  . Acute hypernatremia 06/15/2018  . Dehydration   . Routine general medical examination at a health care facility 04/28/2017  . Lipids abnormal 04/28/2017  . Acute CVA (cerebrovascular accident) ([place]) 03/31/2016  . Cerebrovascular accident (CVA) ([place])   . S/P lumbar laminectomy 05/03/2015  . Actinic keratosis 02/04/2011  . BACK PAIN 09/03/2009  . ERECTILE DYSFUNCTION 03/24/2008  . Essential hypertension 03/24/2008    Orientation RESPIRATION BLADDER Height & Weight     Self, Place  Normal Incontinent, External catheter Weight: 230 lb (104.3 kg) Height:  5\' 10"  (177.8 cm)  BEHAVIORAL SYMPTOMS/MOOD NEUROLOGICAL BOWEL NUTRITION STATUS      Continent Diet(see dc summary)  AMBULATORY STATUS COMMUNICATION OF NEEDS Skin   Limited Assist Verbally                         Personal Care Assistance Level of Assistance  Bathing, Feeding, Dressing Bathing Assistance: Maximum assistance Feeding assistance: Independent Dressing Assistance: Maximum assistance     Functional Limitations Info  Sight, Hearing, Speech Sight  Info: Adequate Hearing Info: Adequate Speech Info: Adequate    SPECIAL CARE FACTORS FREQUENCY  PT (By licensed PT), OT (By licensed OT)     PT Frequency: 5x wk OT Frequency: 5x wk            Contractures Contractures Info: Not present    Additional Factors Info  Code Status, Allergies Code Status Info: Full Code Allergies Info: no know allergies           Current Medications (06/17/2018):  This is the current hospital active medication list Current Facility-Administered Medications  Medication Dose Route Frequency Provider Last Rate Last Dose  . lip balm (CARMEX) ointment           . 0.9 %  sodium chloride infusion   Intravenous Continuous Lacretia Leigh, MD 125 mL/hr at 06/16/18 1243    . acetaminophen (TYLENOL) tablet 650 mg  650 mg Oral Q6H PRN Dhungel, Nishant, MD       Or  . acetaminophen (TYLENOL) suppository 650 mg  650 mg Rectal Q6H PRN Dhungel, Nishant, MD      . aspirin chewable tablet 81 mg  81 mg Oral Daily Dhungel, Nishant, MD   81 mg at 06/17/18 0821  . enoxaparin (LOVENOX) injection 40 mg  40 mg Subcutaneous Q24H Dhungel, Nishant, MD   40 mg at 06/16/18 2254  . lisinopril (PRINIVIL,ZESTRIL) tablet 20 mg  20 mg Oral Daily Dhungel, Nishant, MD   20 mg at 06/17/18 0821  . ondansetron (ZOFRAN) tablet 4 mg  4 mg Oral Q6H PRN Dhungel, Nishant, MD  Or  . ondansetron (ZOFRAN) injection 4 mg  4 mg Intravenous Q6H PRN Dhungel, Nishant, MD         Discharge Medications: Please see discharge summary for a list of discharge medications.  Relevant Imaging Results:  Relevant Lab Results:   Additional Information SS# [id number]  Wende Neighbors, LCSW

## 2018-06-17 NOTE — Progress Notes (Signed)
PROGRESS NOTE    Timothy Duncan  AYT:[id number] DOB: 14-Jul-1940 DOA: 06/14/2018 PCP: Caren Macadam, MD  Brief Narrative:  Timothy Duncan  is Timothy Duncan 77 y.o. 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  History provided from the chart as patient does not recall the incident.  He reports that he had frequent falls recently and 1 week back fell and landed on his buttocks.  Denies sustaining any head trauma or loss of consciousness.  Reportedly patient was not seen for almost 24 hours.  Patient was evaluated in the ED 3 days ago for fall and unable to get up on his own.  Family also concerned about recent confusion with concern for dementia.  Son was in the process of obtaining healthcare and financial power of attorney and make living arrangements.  He was seen by PT in the ED who recommended SNF but patient refused. No recent fevers, patient denies any headache, blurred vision, dizziness, chest pain, palpitations, shortness of breath, abdominal pain, nausea, vomiting, bowel or urinary symptoms.  (Patient is Timothy Duncan poor historian and unable to provide much information).  Assessment & Plan:   Principal Problem:   Hypernatremia Active Problems:   Essential hypertension   Acute metabolic encephalopathy   Hypokalemia   Rhabdomyolysis   Acute hypernatremia   Acute metabolic encephalopathy (HCC) Anasia Agro&Ox3 today, no focal deficits appreciated  Likely some delirium and with concern about recent mental status changes from family, dementia is certainly possible Head CT with moderate to advanced chronic small vessel ischemia Normal TSH, B12, RPR, HIV Negative etoh level (apparently hx of heavy drinking, but no reported drinking for years per family) Delirium precautions Would likely benefit from outpatient neurology follow up for likely dementia  Generalized weakness and  failure to thrive  Difficulty Ambulating PT recommending SNF (able to stand with PT and take Trayon Krantz few steps with RW) Planning for SNF  Elevated Bilirubin:  Fractionate  [place] notable for steatosis  Hypernatremia Improved  Hypokalemia Follow and replace as needed.  Follow magnesium.  Rhabdomyolysis Improved Statin was d/c'd Follow repeat CK (UA with small Hb, 0-5 RBC, renal function appears at baseline)  History of CVA Resume aspirin.  Hold statin due to rhabdomyolysis.  DVT prophylaxis: lovenox Code Status: full  Family Communication: son over phone Disposition Plan: pending placement   Consultants:   none  Procedures:  none  Antimicrobials:  Anti-infectives (From admission, onward)   None     Subjective: Timothy Duncan&Ox3. Denies pain, except some mild pain with moving in bed. Denies saddle anesthesia, incontinence, LE weakness.   Objective: Vitals:   06/16/18 2020 06/17/18 0427 06/17/18 0821 06/17/18 1342  BP: (!) 163/67 (!) 144/67 (!) 150/76 (!) 144/74  Pulse: 72 77 75 72  Resp: 20 20    Temp: 99 F (37.2 C) 98.1 F (36.7 C)  98 F (36.7 C)  TempSrc: Oral Oral  Oral  SpO2: 96% 93%  96%  Weight:      Height:        Intake/Output Summary (Last 24 hours) at 06/17/2018 1652 Last data filed at 06/17/2018 1650 Gross per 24 hour  Intake -  Output 1750 ml  Net -1750 ml   Filed Weights   06/14/18 2310  Weight: 104.3 kg    Examination:  General: No acute distress. Cardiovascular: Heart sounds show Timothy Duncan regular rate, and rhythm.. Lungs:  Clear to auscultation bilaterally Abdomen: Soft, nontender, nondistended  Neurological: Alert and oriented 3. Moves all extremities 4. Cranial nerves II through XII grossly intact. No midline back pain Skin: Warm and dry. No rashes or lesions. Extremities: No clubbing or cyanosis. No edema.  Psychiatric: Mood and affect are normal. Insight and judgment are appropriate.    Data Reviewed: I have personally reviewed  following labs and imaging studies  CBC: Recent Labs  Lab 06/14/18 2355 06/15/18 0526 06/16/18 0611 06/17/18 0541  WBC 14.8* 12.2* 11.8* 11.7*  NEUTROABS 11.5* 9.3*  --   --   HGB 16.7 14.8 13.4 13.4  HCT 51.3 46.2 42.4 42.1  MCV 96.2 97.7 95.5 97.5  PLT 215 186 172 419   Basic Metabolic Panel: Recent Labs  Lab 06/14/18 2355 06/15/18 0526 06/16/18 0611 06/17/18 0541  NA 148* 146* 143 141  K 3.3* 3.9 3.4* 4.1  CL 111 116* 114* 112*  CO2 23 21* 22 21*  GLUCOSE 123* 159* 101* 96  BUN 18 17 13 9   CREATININE 1.08 0.94 0.89 0.79  CALCIUM 9.2 8.5* 8.1* 8.3*  MG  --   --  2.1 2.4   GFR: Estimated Creatinine Clearance: 93.5 mL/min (by C-G formula based on SCr of 0.79 mg/dL). Liver Function Tests: Recent Labs  Lab 06/14/18 2355 06/16/18 0611 06/17/18 0541  AST 33 23 20  ALT 19 14 13   ALKPHOS 61 48 50  BILITOT 2.6* 1.6* 2.7*  PROT 7.9 6.2* 6.0*  ALBUMIN 4.4 3.2* 3.4*   No results for input(s): LIPASE, AMYLASE in the last 168 hours. No results for input(s): AMMONIA in the last 168 hours. Coagulation Profile: No results for input(s): INR, PROTIME in the last 168 hours. Cardiac Enzymes: Recent Labs  Lab 06/14/18 2355 06/15/18 0526 06/17/18 0541  CKTOTAL 714* 576* 155   BNP (last 3 results) No results for input(s): PROBNP in the last 8760 hours. HbA1C: No results for input(s): HGBA1C in the last 72 hours. CBG: No results for input(s): GLUCAP in the last 168 hours. Lipid Profile: No results for input(s): CHOL, HDL, LDLCALC, TRIG, CHOLHDL, LDLDIRECT in the last 72 hours. Thyroid Function Tests: Recent Labs    06/15/18 0840  TSH 2.165   Anemia Panel: Recent Labs    06/15/18 0840  VITAMINB12 383   Sepsis Labs: No results for input(s): PROCALCITON, LATICACIDVEN in the last 168 hours.  No results found for this or any previous visit (from the past 240 hour(s)).       Radiology Studies: US Abdomen Limited Ruq  Result Date: 06/17/2018 CLINICAL  DATA:  77 y/o  M; increased liver function test. EXAM: ULTRASOUND ABDOMEN LIMITED RIGHT UPPER QUADRANT COMPARISON:  None. FINDINGS: Gallbladder: No gallstones or wall thickening visualized. No sonographic Murphy sign noted by sonographer. Common bile duct: Diameter: 4.8 mm Liver: Diffusely increased liver echogenicity compatible with steatosis. 2.7 cm geographic hypoechoic focus within the right lobe, probable fatty sparing. Portal vein is patent on color Doppler imaging with normal direction of blood flow towards the liver. IMPRESSION: Hepatic steatosis.  No acute process identified. Electronically Signed   By: Kristine Garbe M.D.   On: 06/17/2018 16:15        Scheduled Meds: . aspirin  81 mg Oral Daily  . enoxaparin (LOVENOX) injection  40 mg Subcutaneous Q24H  . lisinopril  20 mg Oral Daily   Continuous Infusions: . sodium chloride 125 mL/hr at 06/17/18 1319     LOS: 0 days    Time  spent: over 30 min    Fayrene Helper, MD [facility] Pager [phone]  If 7PM-7AM, please contact night-coverage [email or web address] Password [email or web address] 06/17/2018, 4:52 PM

## 2018-06-18 ENCOUNTER — Observation Stay (HOSPITAL_COMMUNITY): Payer: Medicare HMO

## 2018-06-18 DIAGNOSIS — R05 Cough: Secondary | ICD-10-CM | POA: Diagnosis not present

## 2018-06-18 DIAGNOSIS — E870 Hyperosmolality and hypernatremia: Secondary | ICD-10-CM | POA: Diagnosis not present

## 2018-06-18 LAB — CBC
HCT: 44.5 % (ref 39.0–52.0)
Hemoglobin: 14.1 g/dL (ref 13.0–17.0)
MCH: 31 pg (ref 26.0–34.0)
MCHC: 31.7 g/dL (ref 30.0–36.0)
MCV: 97.8 fL (ref 80.0–100.0)
PLATELETS: 197 10*3/uL (ref 150–400)
RBC: 4.55 MIL/uL (ref 4.22–5.81)
RDW: 13.4 % (ref 11.5–15.5)
WBC: 13.6 10*3/uL — ABNORMAL HIGH (ref 4.0–10.5)
nRBC: 0 % (ref 0.0–0.2)

## 2018-06-18 LAB — COMPREHENSIVE METABOLIC PANEL
ALT: 16 U/L (ref 0–44)
AST: 19 U/L (ref 15–41)
Albumin: 3.2 g/dL — ABNORMAL LOW (ref 3.5–5.0)
Alkaline Phosphatase: 55 U/L (ref 38–126)
Anion gap: 11 (ref 5–15)
BUN: 9 mg/dL (ref 8–23)
CO2: 19 mmol/L — ABNORMAL LOW (ref 22–32)
Calcium: 8.4 mg/dL — ABNORMAL LOW (ref 8.9–10.3)
Chloride: 110 mmol/L (ref 98–111)
Creatinine, Ser: 0.82 mg/dL (ref 0.61–1.24)
GFR calc Af Amer: >60 mL/min (ref >60)
GFR calc non Af Amer: >60 mL/min (ref >60)
Glucose, Bld: 93 mg/dL (ref 70–99)
Potassium: 3.9 mmol/L (ref 3.5–5.1)
Sodium: 140 mmol/L (ref 135–145)
Total Bilirubin: 1.6 mg/dL — ABNORMAL HIGH (ref 0.3–1.2)
Total Protein: 6.3 g/dL — ABNORMAL LOW (ref 6.5–8.1)

## 2018-06-18 MED ORDER — BENZONATATE 100 MG PO CAPS
100.0000 mg | ORAL_CAPSULE | Freq: Three times a day (TID) | ORAL | Status: DC | PRN
  Administered 2018-06-18 – 2018-06-21 (×6): 100 mg via ORAL
  Filled 2018-06-18 (×6): qty 1

## 2018-06-18 MED ORDER — ALBUTEROL SULFATE (2.5 MG/3ML) 0.083% IN NEBU
2.5000 mg | INHALATION_SOLUTION | RESPIRATORY_TRACT | Status: DC | PRN
  Administered 2018-06-18: 2.5 mg via RESPIRATORY_TRACT
  Filled 2018-06-18: qty 3

## 2018-06-18 MED ORDER — IPRATROPIUM-ALBUTEROL 0.5-2.5 (3) MG/3ML IN SOLN
3.0000 mL | Freq: Four times a day (QID) | RESPIRATORY_TRACT | Status: DC
  Administered 2018-06-18 – 2018-06-19 (×4): 3 mL via RESPIRATORY_TRACT
  Filled 2018-06-18 (×4): qty 3

## 2018-06-18 NOTE — Progress Notes (Signed)
PROGRESS NOTE    Timothy Duncan  DXA:[id number] DOB: [birth year] DOA: 06/14/2018 PCP: Caren Macadam, MD  Brief Narrative:  Timothy Duncan  is Timothy Duncan 77 y.o. 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  History provided from the chart as patient does not recall the incident.  He reports that he had frequent falls recently and 1 week back fell and landed on his buttocks.  Denies sustaining any head trauma or loss of consciousness.  Reportedly patient was not seen for almost 24 hours.  Patient was evaluated in the ED 3 days ago for fall and unable to get up on his own.  Family also concerned about recent confusion with concern for dementia.  Son was in the process of obtaining healthcare and financial power of attorney and make living arrangements.  He was seen by PT in the ED who recommended SNF but patient refused. No recent fevers, patient denies any headache, blurred vision, dizziness, chest pain, palpitations, shortness of breath, abdominal pain, nausea, vomiting, bowel or urinary symptoms.  (Patient is Timothy Duncan poor historian and unable to provide much information).  Assessment & Plan:   Principal Problem:   Hypernatremia Active Problems:   Essential hypertension   Acute metabolic encephalopathy   Hypokalemia   Rhabdomyolysis   Acute hypernatremia  Acute metabolic encephalopathy (HCC) [name]&Ox 2-3 today (knew [facility], but not [facility]), no focal deficits appreciated  Likely some delirium and with concern about recent mental status changes from family, dementia is certainly possible Head CT with moderate to advanced chronic small vessel ischemia Normal TSH, B12, RPR, HIV Negative etoh level (apparently hx of heavy drinking, but no reported drinking for years per family) Delirium precautions Would likely benefit from outpatient neurology follow up for likely  dementia  Cough  Wheezing:  CXR done today, will follow echo as well  Scheduled and prn nebs Tessalon perles  Generalized weakness and failure to thrive  Difficulty Ambulating PT recommending SNF (able to stand with PT and take [name] few steps with RW) Planning for SNF  Elevated Bilirubin:  Fluctuates, improved today, follow [place] notable for steatosis  Hypernatremia Improved  Hypokalemia Follow and replace as needed.  Follow magnesium.  Rhabdomyolysis Improved Statin was d/c'd Follow repeat CK (UA with small Hb, 0-5 RBC, renal function appears at baseline)  History of CVA Resume aspirin.  Hold statin due to rhabdomyolysis.  NAGMA: likely 2/2 NS, d/c and follow   DVT prophylaxis: lovenox Code Status: full  Family Communication: none at bedside Disposition Plan: pending placement   Consultants:   none  Procedures:  none  Antimicrobials:  Anti-infectives (From admission, onward)   None     Subjective: Timothy Duncan 2-3. C/o cough Wheeze  Objective: Vitals:   06/17/18 2042 06/18/18 1053 06/18/18 1354 06/18/18 1423  BP: (!) 154/87  140/65   Pulse: 74  80   Resp: (!) 25  14   Temp: 98 F (36.7 C)     TempSrc: Oral     SpO2: 91% 96% 94% 94%  Weight:      Height:        Intake/Output Summary (Last 24 hours) at 06/18/2018 1634 Last data filed at 06/18/2018 1620 Gross per 24 hour  Intake 360 ml  Output 2800 ml  Net -2440 ml   Filed Weights   06/14/18 2310  Weight: 104.3 kg  Examination:  General: No acute distress. Cardiovascular: Heart sounds show [name] regular rate, and rhythm. Lungs: cough, mild diffuse expiratory wheeze, mildly increased WOB Abdomen: Soft, nontender, nondistended  Neurological: Alert and oriented 2-3. Moves all extremities 4. Cranial nerves II through XII grossly intact. Skin: Warm and dry. No rashes or lesions. Extremities: No clubbing or cyanosis. No edema.  Psychiatric: Mood and affect are normal. Insight and judgment are  appropriate.   Data Reviewed: I have personally reviewed following labs and imaging studies  CBC: Recent Labs  Lab 06/14/18 2355 06/15/18 0526 06/16/18 0611 06/17/18 0541 06/18/18 1006  WBC 14.8* 12.2* 11.8* 11.7* 13.6*  NEUTROABS 11.5* 9.3*  --   --   --   HGB 16.7 14.8 13.4 13.4 14.1  HCT 51.3 46.2 42.4 42.1 44.5  MCV 96.2 97.7 95.5 97.5 97.8  PLT 215 186 172 171 588   Basic Metabolic Panel: Recent Labs  Lab 06/14/18 2355 06/15/18 0526 06/16/18 0611 06/17/18 0541 06/18/18 1006  NA 148* 146* 143 141 140  K 3.3* 3.9 3.4* 4.1 3.9  CL 111 116* 114* 112* 110  CO2 23 21* 22 21* 19*  GLUCOSE 123* 159* 101* 96 93  BUN 18 17 13 9 9   CREATININE 1.08 0.94 0.89 0.79 0.82  CALCIUM 9.2 8.5* 8.1* 8.3* 8.4*  MG  --   --  2.1 2.4  --    GFR: Estimated Creatinine Clearance: 91.2 mL/min (by C-G formula based on SCr of 0.82 mg/dL). Liver Function Tests: Recent Labs  Lab 06/14/18 2355 06/16/18 0611 06/17/18 0541 06/18/18 1006  AST 33 23 20 19   ALT 19 14 13 16   ALKPHOS 61 48 50 55  BILITOT 2.6* 1.6* 2.5*  2.7* 1.6*  PROT 7.9 6.2* 6.0* 6.3*  ALBUMIN 4.4 3.2* 3.4* 3.2*   No results for input(s): LIPASE, AMYLASE in the last 168 hours. No results for input(s): AMMONIA in the last 168 hours. Coagulation Profile: No results for input(s): INR, PROTIME in the last 168 hours. Cardiac Enzymes: Recent Labs  Lab 06/14/18 2355 06/15/18 0526 06/17/18 0541  CKTOTAL 714* 576* 155   BNP (last 3 results) No results for input(s): PROBNP in the last 8760 hours. HbA1C: No results for input(s): HGBA1C in the last 72 hours. CBG: No results for input(s): GLUCAP in the last 168 hours. Lipid Profile: No results for input(s): CHOL, HDL, LDLCALC, TRIG, CHOLHDL, LDLDIRECT in the last 72 hours. Thyroid Function Tests: No results for input(s): TSH, T4TOTAL, FREET4, T3FREE, THYROIDAB in the last 72 hours. Anemia Panel: No results for input(s): VITAMINB12, FOLATE, FERRITIN, TIBC, IRON,  RETICCTPCT in the last 72 hours. Sepsis Labs: No results for input(s): PROCALCITON, LATICACIDVEN in the last 168 hours.  No results found for this or any previous visit (from the past 240 hour(s)).       Radiology Studies: Dg Chest 2 View  Result Date: 06/18/2018 CLINICAL DATA:  Nonproductive cough EXAM: CHEST - 2 VIEW COMPARISON:  06/15/2018, 06/11/2018 FINDINGS: No acute consolidation or pleural effusion. Borderline to mild cardiomegaly. No pneumothorax. IMPRESSION: No active cardiopulmonary disease.  Borderline to mild cardiomegaly. Electronically Signed   By: Donavan Foil M.D.   On: 06/18/2018 14:58   US Abdomen Limited Ruq  Result Date: 06/17/2018 CLINICAL DATA:  77 y/o  M; increased liver function test. EXAM: ULTRASOUND ABDOMEN LIMITED RIGHT UPPER QUADRANT COMPARISON:  None. FINDINGS: Gallbladder: No gallstones or wall thickening visualized. No sonographic Murphy sign noted by sonographer. Common bile duct: Diameter: 4.8 mm Liver: Diffusely  increased liver echogenicity compatible with steatosis. 2.7 cm geographic hypoechoic focus within the right lobe, probable fatty sparing. Portal vein is patent on color Doppler imaging with normal direction of blood flow towards the liver. IMPRESSION: Hepatic steatosis.  No acute process identified. Electronically Signed   By: Kristine Garbe M.D.   On: 06/17/2018 16:15        Scheduled Meds: . aspirin  81 mg Oral Daily  . enoxaparin (LOVENOX) injection  40 mg Subcutaneous Q24H  . ipratropium-albuterol  3 mL Nebulization Q6H  . lisinopril  20 mg Oral Daily   Continuous Infusions: . sodium chloride 125 mL/hr at 06/18/18 1535     LOS: 0 days    Time spent: over 30 min    Fayrene Helper, MD [facility] Pager [phone]  If 7PM-7AM, please contact night-coverage [email or web address] Password [facility] 06/18/2018, 4:34 PM

## 2018-06-18 NOTE — Progress Notes (Signed)
Patient had a short run of trigeminy PVCs.  Patient was asymptomatic.  PCP was notified.

## 2018-06-18 NOTE — Progress Notes (Signed)
Physical Therapy Treatment Patient Details Name: Timothy Duncan MRN: [id number] DOB: July 26, 1940 Today's Date: 06/18/2018    History of Present Illness 77 yo male admitted with acute metabolic encephalopathy, hypernatremia, falls. Hx of DVT, CVA, PE, TIA, lumbar surgery    PT Comments    Progressing with mobility. Pt continues to require +2 assist for safety when mobilizing. He tolerated activity well. Continue to recommend ST rehab at [facility]. Will continue to follow.    Follow Up Recommendations  SNF     Equipment Recommendations  Rolling walker with 5" wheels    Recommendations for Other Services       Precautions / Restrictions Precautions Precautions: Fall Restrictions Weight Bearing Restrictions: No    Mobility  Bed Mobility Overal bed mobility: Needs Assistance Bed Mobility: Supine to Sit     Supine to sit: Mod assist;+2 for physical assistance;+2 for safety/equipment;HOB elevated     General bed mobility comments: Assist for trunk and LEs. Utilized bedpad to scoot. Increased time. Multimodal cueing required.   Transfers Overall transfer level: Needs assistance Equipment used: Rolling walker (2 wheeled) Transfers: Sit to/from Stand Sit to Stand: Min assist;+2 physical assistance;+2 safety/equipment;From elevated surface         General transfer comment: Assist to rise, stabilize, control descent. VCs safety, technique, hand placement.   Ambulation/Gait Ambulation/Gait assistance: Min assist;+2 safety/equipment Gait Distance (Feet): 50 Feet Assistive device: Rolling walker (2 wheeled) Gait Pattern/deviations: Decreased step length - right;Decreased step length - left;Decreased stride length;Step-through pattern     General Gait Details: Mod VCs for safety, step length. Slow gait speed. Assist to stabilize pt throughout distance. Followed closely with a recliner   Stairs             Wheelchair Mobility    Modified Rankin (Stroke Patients Only)        Balance Overall balance assessment: Needs assistance         Standing balance support: Bilateral upper extremity supported Standing balance-Leahy Scale: Poor                              Cognition Arousal/Alertness: Awake/alert Behavior During Therapy: WFL for tasks assessed/performed Overall Cognitive Status: No family/caregiver present to determine baseline cognitive functioning                                 General Comments: Pt with decreased safety awareness and unaware of deficits.       Exercises      General Comments        Pertinent Vitals/Pain Pain Assessment: Faces Faces Pain Scale: Hurts little more Pain Location: back Pain Descriptors / Indicators: Sore;Discomfort Pain Intervention(s): Monitored during session    Home Living                      Prior Function            PT Goals (current goals can now be found in the care plan section) Progress towards PT goals: Progressing toward goals    Frequency    Min 3X/week      PT Plan Current plan remains appropriate    Co-evaluation              AM-PAC PT "6 Clicks" Mobility   Outcome Measure  Help needed turning from your back to your side while in a flat bed  without using bedrails?: A Lot Help needed moving from lying on your back to sitting on the side of a flat bed without using bedrails?: A Lot Help needed moving to and from a bed to a chair (including a wheelchair)?: A Lot Help needed standing up from a chair using your arms (e.g., wheelchair or bedside chair)?: A Lot Help needed to walk in hospital room?: A Lot Help needed climbing 3-5 steps with a railing? : Total 6 Click Score: 11    End of Session Equipment Utilized During Treatment: Gait belt Activity Tolerance: Patient tolerated treatment well Patient left: in chair;with call bell/phone within reach;with chair alarm set   PT Visit Diagnosis: Muscle weakness (generalized)  (M62.81);Unsteadiness on feet (R26.81);Difficulty in walking, not elsewhere classified (R26.2);History of falling (Z91.81)     Time: 6979-4801 PT Time Calculation (min) (ACUTE ONLY): 17 min  Charges:  $Gait Training: 8-22 mins                        Weston Anna, PT Acute Rehabilitation Services Pager: [phone] Office: [phone]

## 2018-06-18 NOTE — Progress Notes (Signed)
CSW following patient for support and discharge needs. CSW spoke to patients son [name] via phone and he stated that he would like patient to go to [facility]. CSW spoke to admission coordinator of [facility] and she stated they will be able to take patient once authorization has been received through [facility]. Admission coordinator will reach out to [facility] once insurance has been approved.   Rhea Pink, MSW,  [place]

## 2018-06-19 ENCOUNTER — Observation Stay (HOSPITAL_BASED_OUTPATIENT_CLINIC_OR_DEPARTMENT_OTHER): Payer: Medicare HMO

## 2018-06-19 DIAGNOSIS — I361 Nonrheumatic tricuspid (valve) insufficiency: Secondary | ICD-10-CM | POA: Diagnosis not present

## 2018-06-19 DIAGNOSIS — I370 Nonrheumatic pulmonary valve stenosis: Secondary | ICD-10-CM | POA: Diagnosis not present

## 2018-06-19 DIAGNOSIS — E870 Hyperosmolality and hypernatremia: Secondary | ICD-10-CM | POA: Diagnosis not present

## 2018-06-19 LAB — CBC
HCT: 44.9 % (ref 39.0–52.0)
Hemoglobin: 14.3 g/dL (ref 13.0–17.0)
MCH: 31 pg (ref 26.0–34.0)
MCHC: 31.8 g/dL (ref 30.0–36.0)
MCV: 97.2 fL (ref 80.0–100.0)
Platelets: 207 10*3/uL (ref 150–400)
RBC: 4.62 MIL/uL (ref 4.22–5.81)
RDW: 13.4 % (ref 11.5–15.5)
WBC: 12.5 10*3/uL — AB (ref 4.0–10.5)
nRBC: 0 % (ref 0.0–0.2)

## 2018-06-19 LAB — COMPREHENSIVE METABOLIC PANEL
ALT: 17 U/L (ref 0–44)
ANION GAP: 16 — AB (ref 5–15)
AST: 19 U/L (ref 15–41)
Albumin: 3.3 g/dL — ABNORMAL LOW (ref 3.5–5.0)
Alkaline Phosphatase: 57 U/L (ref 38–126)
BUN: 10 mg/dL (ref 8–23)
CO2: 15 mmol/L — ABNORMAL LOW (ref 22–32)
Calcium: 8.6 mg/dL — ABNORMAL LOW (ref 8.9–10.3)
Chloride: 109 mmol/L (ref 98–111)
Creatinine, Ser: 0.83 mg/dL (ref 0.61–1.24)
GFR calc Af Amer: >60 mL/min (ref >60)
GFR calc non Af Amer: >60 mL/min (ref >60)
Glucose, Bld: 105 mg/dL — ABNORMAL HIGH (ref 70–99)
Potassium: 3.8 mmol/L (ref 3.5–5.1)
Sodium: 140 mmol/L (ref 135–145)
Total Bilirubin: 1.2 mg/dL (ref 0.3–1.2)
Total Protein: 6.8 g/dL (ref 6.5–8.1)

## 2018-06-19 LAB — ECHOCARDIOGRAM COMPLETE
HEIGHTINCHES: 70 in
Weight: 3680 oz

## 2018-06-19 LAB — BRAIN NATRIURETIC PEPTIDE: B Natriuretic Peptide: 88 pg/mL (ref 0.0–100.0)

## 2018-06-19 LAB — MAGNESIUM: Magnesium: 2.2 mg/dL (ref 1.7–2.4)

## 2018-06-19 MED ORDER — IPRATROPIUM-ALBUTEROL 0.5-2.5 (3) MG/3ML IN SOLN
3.0000 mL | Freq: Three times a day (TID) | RESPIRATORY_TRACT | Status: DC
  Administered 2018-06-19 – 2018-06-20 (×3): 3 mL via RESPIRATORY_TRACT
  Filled 2018-06-19 (×3): qty 3

## 2018-06-19 MED ORDER — SPIRONOLACTONE 12.5 MG HALF TABLET
12.5000 mg | ORAL_TABLET | Freq: Every day | ORAL | Status: DC
  Administered 2018-06-20 – 2018-06-21 (×2): 12.5 mg via ORAL
  Filled 2018-06-19 (×3): qty 1

## 2018-06-19 MED ORDER — LOSARTAN POTASSIUM 25 MG PO TABS
25.0000 mg | ORAL_TABLET | Freq: Every day | ORAL | Status: DC
  Administered 2018-06-20 – 2018-06-21 (×2): 25 mg via ORAL
  Filled 2018-06-19 (×2): qty 1

## 2018-06-19 MED ORDER — PERFLUTREN LIPID MICROSPHERE
1.0000 mL | INTRAVENOUS | Status: AC | PRN
  Administered 2018-06-19: 2 mL via INTRAVENOUS
  Filled 2018-06-19: qty 10

## 2018-06-19 MED ORDER — CARVEDILOL 3.125 MG PO TABS
3.1250 mg | ORAL_TABLET | Freq: Two times a day (BID) | ORAL | Status: DC
  Administered 2018-06-20 – 2018-06-21 (×4): 3.125 mg via ORAL
  Filled 2018-06-19 (×4): qty 1

## 2018-06-19 NOTE — Progress Notes (Signed)
  Echocardiogram 2D Echocardiogram with definity has been performed.  Timothy Duncan 06/19/2018, 10:05 AM

## 2018-06-19 NOTE — Progress Notes (Signed)
Pt had six beat run of VTach on tele. RN was talking to patient at the time tele called. MD aware and at bedside for rounds. Will continue to monitor.

## 2018-06-19 NOTE — Progress Notes (Signed)
PROGRESS NOTE    Timothy Duncan  EUM:[id number] DOB: 12-18-1940 DOA: 06/14/2018 PCP: Caren Macadam, MD  Brief Narrative:  Timothy Duncan  is Timothy Duncan 77 y.o. 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  History provided from the chart as patient does not recall the incident.  He reports that he had frequent falls recently and 1 week back fell and landed on his buttocks.  Denies sustaining any head trauma or loss of consciousness.  Reportedly patient was not seen for almost 24 hours.  Patient was evaluated in the ED 3 days ago for fall and unable to get up on his own.  Family also concerned about recent confusion with concern for dementia.  Son was in the process of obtaining healthcare and financial power of attorney and make living arrangements.  He was seen by PT in the ED who recommended SNF but patient refused. No recent fevers, patient denies any headache, blurred vision, dizziness, chest pain, palpitations, shortness of breath, abdominal pain, nausea, vomiting, bowel or urinary symptoms.  (Patient is Timothy Duncan poor historian and unable to provide much information).  Assessment & Plan:   Principal Problem:   Hypernatremia Active Problems:   Essential hypertension   Acute metabolic encephalopathy   Hypokalemia   Rhabdomyolysis   Acute hypernatremia  Acute metabolic encephalopathy (HCC) Seems at baseline today, no focal deficits appreciated  Likely some delirium and with concern about recent mental status changes from family, dementia is certainly possible Head CT with moderate to advanced chronic small vessel ischemia Normal TSH, B12, RPR, HIV Negative etoh level (apparently hx of heavy drinking, but no reported drinking for years per family) Delirium precautions Would likely benefit from outpatient neurology follow up for likely dementia  Cough  Wheezing:    CXR without significant abnormality Wheezing seems improved today, continue to monitor Scheduled and prn nebs Tessalon perles This seems to have started after he got to hospital?  ? URI.  Afebrile, continue supportive care.   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  Not clear to me that he's ever followed up with cardiology (referral was placed in 2017, but I don't see follow up appt). He's on lisinopril (will switch to losartan in case desire to switch to ARNI) will start carvedilol and spironolactone Appears euvolemic, follow BNP He needs to see cardiology (saw echo results later in day, would discuss with pt tomorrow if he has cardiologist and assist with follow up if this is not the case).   Nonsustained Vtach: would consult cardiology if this is recurrent or prolonged, especially in setting of his decreased EF. Start carvedilol as noted above  Generalized weakness and failure to thrive  Difficulty Ambulating PT recommending SNF (able to stand with PT and take Allayah Raineri few steps with RW) Planning for SNF  Elevated Bilirubin:  Fluctuates, improved today, follow [place] notable for steatosis  Hypernatremia Improved  Hypokalemia Follow and replace as needed.  Follow magnesium.  Rhabdomyolysis Improved Statin was d/c'd Follow repeat CK (UA with small Hb, 0-5 RBC, renal function appears at baseline)  History of CVA Resume aspirin.  Hold statin due to rhabdomyolysis.  NAGMA: likely 2/2 NS, d/c and follow   DVT prophylaxis: lovenox Code Status: full  Family Communication: none at bedside Disposition Plan: pending placement   Consultants:   none  Procedures:  none  Antimicrobials:  Anti-infectives (From admission, onward)   None     Subjective: C/o persistent cough Otherwise doing ok  Objective: Vitals:   06/19/18 0326 06/19/18 0424 06/19/18 0805 06/19/18 1324  BP:  (!) 165/81  (!) 149/83  Pulse:  83  86  Resp:  20    Temp:  98.5  F (36.9 C)  (!) 97.5 F (36.4 C)  TempSrc:  Oral  Oral  SpO2: 93% 93% 94% 94%  Weight:      Height:        Intake/Output Summary (Last 24 hours) at 06/19/2018 1852 Last data filed at 06/19/2018 1300 Gross per 24 hour  Intake 540 ml  Output 1500 ml  Net -960 ml   Filed Weights   06/14/18 2310  Weight: 104.3 kg    Examination:  General: No acute distress. Cardiovascular: Heart sounds show Timothy Duncan regular rate, and rhythm. Lungs: Clear to auscultation bilaterally Abdomen: Soft, nontender, nondistended Neurological: Alert and oriented 3. Moves all extremities 4. Cranial nerves II through XII grossly intact. Skin: Warm and dry. No rashes or lesions. Extremities: No clubbing or cyanosis. No edema. Psychiatric: Mood and affect are normal. Insight and judgment are appropriate.   Data Reviewed: I have personally reviewed following labs and imaging studies  CBC: Recent Labs  Lab 06/14/18 2355 06/15/18 0526 06/16/18 0611 06/17/18 0541 06/18/18 1006 06/19/18 0551  WBC 14.8* 12.2* 11.8* 11.7* 13.6* 12.5*  NEUTROABS 11.5* 9.3*  --   --   --   --   HGB 16.7 14.8 13.4 13.4 14.1 14.3  HCT 51.3 46.2 42.4 42.1 44.5 44.9  MCV 96.2 97.7 95.5 97.5 97.8 97.2  PLT 215 186 172 171 197 376   Basic Metabolic Panel: Recent Labs  Lab 06/15/18 0526 06/16/18 0611 06/17/18 0541 06/18/18 1006 06/19/18 0551  NA 146* 143 141 140 140  K 3.9 3.4* 4.1 3.9 3.8  CL 116* 114* 112* 110 109  CO2 21* 22 21* 19* 15*  GLUCOSE 159* 101* 96 93 105*  BUN 17 13 9 9 10   CREATININE 0.94 0.89 0.79 0.82 0.83  CALCIUM 8.5* 8.1* 8.3* 8.4* 8.6*  MG  --  2.1 2.4  --  2.2   GFR: Estimated Creatinine Clearance: 90.1 mL/min (by C-G formula based on SCr of 0.83 mg/dL). Liver Function Tests: Recent Labs  Lab 06/14/18 2355 06/16/18 0611 06/17/18 0541 06/18/18 1006 06/19/18 0551  AST 33 23 20 19 19   ALT 19 14 13 16 17   ALKPHOS 61 48 50 55 57  BILITOT 2.6* 1.6* 2.5*  2.7* 1.6* 1.2  PROT 7.9 6.2* 6.0*  6.3* 6.8  ALBUMIN 4.4 3.2* 3.4* 3.2* 3.3*   No results for input(s): LIPASE, AMYLASE in the last 168 hours. No results for input(s): AMMONIA in the last 168 hours. Coagulation Profile: No results for input(s): INR, PROTIME in the last 168 hours. Cardiac Enzymes: Recent Labs  Lab 06/14/18 2355 06/15/18 0526 06/17/18 0541  CKTOTAL 714* 576* 155   BNP (last 3 results) No results for input(s): PROBNP in the last 8760 hours. HbA1C: No results for input(s): HGBA1C in the last 72 hours. CBG: No results for input(s): GLUCAP in the last 168 hours. Lipid Profile: No results for input(s): CHOL, HDL, LDLCALC, TRIG, CHOLHDL, LDLDIRECT in the last 72 hours. Thyroid Function Tests: No results for input(s): TSH, T4TOTAL, FREET4, T3FREE, THYROIDAB in the last 72 hours. Anemia Panel: No results for input(s): VITAMINB12, FOLATE, FERRITIN, TIBC, IRON, RETICCTPCT in the last 72  hours. Sepsis Labs: No results for input(s): PROCALCITON, LATICACIDVEN in the last 168 hours.  No results found for this or any previous visit (from the past 240 hour(s)).       Radiology Studies: Dg Chest 2 View  Result Date: 06/18/2018 CLINICAL DATA:  Nonproductive cough EXAM: CHEST - 2 VIEW COMPARISON:  06/15/2018, 06/11/2018 FINDINGS: No acute consolidation or pleural effusion. Borderline to mild cardiomegaly. No pneumothorax. IMPRESSION: No active cardiopulmonary disease.  Borderline to mild cardiomegaly. Electronically Signed   By: Donavan Foil M.D.   On: 06/18/2018 14:58        Scheduled Meds: . aspirin  81 mg Oral Daily  . enoxaparin (LOVENOX) injection  40 mg Subcutaneous Q24H  . ipratropium-albuterol  3 mL Nebulization TID  . lisinopril  20 mg Oral Daily   Continuous Infusions:    LOS: 0 days    Time spent: over 30 min    Fayrene Helper, MD [facility] Pager [phone]  If 7PM-7AM, please contact night-coverage [email or web address] Password [facility] 06/19/2018, 6:52 PM

## 2018-06-20 DIAGNOSIS — E870 Hyperosmolality and hypernatremia: Secondary | ICD-10-CM

## 2018-06-20 LAB — COMPREHENSIVE METABOLIC PANEL
ALT: 19 U/L (ref 0–44)
AST: 23 U/L (ref 15–41)
Albumin: 3.5 g/dL (ref 3.5–5.0)
Alkaline Phosphatase: 65 U/L (ref 38–126)
Anion gap: 11 (ref 5–15)
BUN: 13 mg/dL (ref 8–23)
CO2: 23 mmol/L (ref 22–32)
Calcium: 8.9 mg/dL (ref 8.9–10.3)
Chloride: 107 mmol/L (ref 98–111)
Creatinine, Ser: 0.92 mg/dL (ref 0.61–1.24)
GFR calc non Af Amer: >60 mL/min (ref >60)
Glucose, Bld: 100 mg/dL — ABNORMAL HIGH (ref 70–99)
Potassium: 4.2 mmol/L (ref 3.5–5.1)
Sodium: 141 mmol/L (ref 135–145)
Total Bilirubin: 1.2 mg/dL (ref 0.3–1.2)
Total Protein: 6.7 g/dL (ref 6.5–8.1)

## 2018-06-20 LAB — CBC
HCT: 46.1 % (ref 39.0–52.0)
Hemoglobin: 14.9 g/dL (ref 13.0–17.0)
MCH: 30.5 pg (ref 26.0–34.0)
MCHC: 32.3 g/dL (ref 30.0–36.0)
MCV: 94.3 fL (ref 80.0–100.0)
Platelets: 253 10*3/uL (ref 150–400)
RBC: 4.89 MIL/uL (ref 4.22–5.81)
RDW: 13.4 % (ref 11.5–15.5)
WBC: 11.3 10*3/uL — ABNORMAL HIGH (ref 4.0–10.5)
nRBC: 0 % (ref 0.0–0.2)

## 2018-06-20 LAB — MAGNESIUM: Magnesium: 2.5 mg/dL — ABNORMAL HIGH (ref 1.7–2.4)

## 2018-06-20 NOTE — Progress Notes (Signed)
PROGRESS NOTE    Timothy Duncan  YPP:[id number] DOB: 1941-01-01 DOA: 06/14/2018 PCP: Caren Macadam, MD   Brief Narrative:  77 year old with history of essential hypertension, hyperlipidemia, DVT not on anticoagulation, CVA in 2017 was found on floor by the family at home.  Apparently has had frequent falls.  Denies any head trauma.  Family admits of some recent confusion concerning for delirium.  Physical therapy had recommended skilled nursing facility   Assessment & Plan:   Principal Problem:   Hypernatremia Active Problems:   Essential hypertension   Acute metabolic encephalopathy   Hypokalemia   Rhabdomyolysis   Acute hypernatremia  Acute metabolic encephalopathy, improved Dementia with intermittent delirium - CT of the head negative, normal TSH, B12, RPR, HIV - Provide supportive care.  Follow-up outpatient with neurology  Chronic systolic congestive heart failure, ejection fraction 45-80%, grade 1 diastolic dysfunction -Currently appears to be euvolemic in nature.  He has not followed up outpatient over 2 years.  Similar echo about 2 years ago.  Medical management, we can follow-up outpatient with cardiology. - Started on aspirin, Coreg, losartan and Aldactone  Nonsustained VT - Resolved.  Generalized weakness and failure to thrive Ambulatory dysfunction -PT recommending skilled nursing facility  Elevated bilirubin with hepatic steatosis - Ultrasound confirms hepatic steatosis  Mild rhabdomyolysis -Improved with fluids  History of CVA -On aspirin.  Holding statin due to rhabdomyolysis   DVT prophylaxis: Lovenox Code Status: Full Family Communication: Attempted to call patient's daughter-in-law and his son while in the room but no response. Disposition Plan: Pending placement  Consultants:   None  Procedures:   None  Antimicrobials:   None   Subjective: Feels a little better this morning.  Does not have any complaints.  Review of  Systems Otherwise negative except as per HPI, including: General: Denies fever, chills, night sweats or unintended weight loss. Resp: Denies cough, wheezing, shortness of breath. Cardiac: Denies chest pain, palpitations, orthopnea, paroxysmal nocturnal dyspnea. GI: Denies abdominal pain, nausea, vomiting, diarrhea or constipation GU: Denies dysuria, frequency, hesitancy or incontinence MS: Denies muscle aches, joint pain or swelling Neuro: Denies headache, neurologic deficits (focal weakness, numbness, tingling), abnormal gait Psych: Denies anxiety, depression, SI/HI/AVH Skin: Denies new rashes or lesions ID: Denies sick contacts, exotic exposures, travel  Objective: Vitals:   06/19/18 2130 06/20/18 0608 06/20/18 0819 06/20/18 1409  BP:  130/77  139/73  Pulse:  91  69  Resp:  (!) 22  (!) 22  Temp:  98.6 F (37 C)  98.2 F (36.8 C)  TempSrc:  Oral  Oral  SpO2: 96% 92% 94% 94%  Weight:      Height:        Intake/Output Summary (Last 24 hours) at 06/20/2018 1505 Last data filed at 06/20/2018 1439 Gross per 24 hour  Intake 360 ml  Output 2000 ml  Net -1640 ml   Filed Weights   06/14/18 2310  Weight: 104.3 kg    Examination:  General exam: Appears calm and comfortable  Respiratory system: Clear to auscultation. Respiratory effort normal. Cardiovascular system: S1 & S2 heard, RRR. No JVD, murmurs, rubs, gallops or clicks. No pedal edema. Gastrointestinal system: Abdomen is nondistended, soft and nontender. No organomegaly or masses felt. Normal bowel sounds heard. Central nervous system: Alert and oriented-x 2 name and place. No focal neurological deficits. Extremities: Symmetric 4+ x 5 power. Skin: No rashes, lesions or ulcers Psychiatry: Judgement and insight appear normal. Mood & affect appropriate.     Data Reviewed:  CBC: Recent Labs  Lab 06/14/18 2355 06/15/18 0526 06/16/18 0611 06/17/18 0541 06/18/18 1006 06/19/18 0551 06/20/18 0520  WBC 14.8* 12.2*  11.8* 11.7* 13.6* 12.5* 11.3*  NEUTROABS 11.5* 9.3*  --   --   --   --   --   HGB 16.7 14.8 13.4 13.4 14.1 14.3 14.9  HCT 51.3 46.2 42.4 42.1 44.5 44.9 46.1  MCV 96.2 97.7 95.5 97.5 97.8 97.2 94.3  PLT 215 186 172 171 197 207 371   Basic Metabolic Panel: Recent Labs  Lab 06/16/18 0611 06/17/18 0541 06/18/18 1006 06/19/18 0551 06/20/18 0520  NA 143 141 140 140 141  K 3.4* 4.1 3.9 3.8 4.2  CL 114* 112* 110 109 107  CO2 22 21* 19* 15* 23  GLUCOSE 101* 96 93 105* 100*  BUN 13 9 9 10 13   CREATININE 0.89 0.79 0.82 0.83 0.92  CALCIUM 8.1* 8.3* 8.4* 8.6* 8.9  MG 2.1 2.4  --  2.2 2.5*   GFR: Estimated Creatinine Clearance: 81.3 mL/min (by C-G formula based on SCr of 0.92 mg/dL). Liver Function Tests: Recent Labs  Lab 06/16/18 0611 06/17/18 0541 06/18/18 1006 06/19/18 0551 06/20/18 0520  AST 23 20 19 19 23   ALT 14 13 16 17 19   ALKPHOS 48 50 55 57 65  BILITOT 1.6* 2.5*  2.7* 1.6* 1.2 1.2  PROT 6.2* 6.0* 6.3* 6.8 6.7  ALBUMIN 3.2* 3.4* 3.2* 3.3* 3.5   No results for input(s): LIPASE, AMYLASE in the last 168 hours. No results for input(s): AMMONIA in the last 168 hours. Coagulation Profile: No results for input(s): INR, PROTIME in the last 168 hours. Cardiac Enzymes: Recent Labs  Lab 06/14/18 2355 06/15/18 0526 06/17/18 0541  CKTOTAL 714* 576* 155   BNP (last 3 results) No results for input(s): PROBNP in the last 8760 hours. HbA1C: No results for input(s): HGBA1C in the last 72 hours. CBG: No results for input(s): GLUCAP in the last 168 hours. Lipid Profile: No results for input(s): CHOL, HDL, LDLCALC, TRIG, CHOLHDL, LDLDIRECT in the last 72 hours. Thyroid Function Tests: No results for input(s): TSH, T4TOTAL, FREET4, T3FREE, THYROIDAB in the last 72 hours. Anemia Panel: No results for input(s): VITAMINB12, FOLATE, FERRITIN, TIBC, IRON, RETICCTPCT in the last 72 hours. Sepsis Labs: No results for input(s): PROCALCITON, LATICACIDVEN in the last 168 hours.  No  results found for this or any previous visit (from the past 240 hour(s)).       Radiology Studies: No results found.      Scheduled Meds: . aspirin  81 mg Oral Daily  . carvedilol  3.125 mg Oral BID WC  . enoxaparin (LOVENOX) injection  40 mg Subcutaneous Q24H  . losartan  25 mg Oral Daily  . spironolactone  12.5 mg Oral Daily   Continuous Infusions:   LOS: 0 days   Time spent= 25 mins    Audreanna Torrisi Arsenio Loader, MD [facility] Pager [phone]   If 7PM-7AM, please contact night-coverage [email or web address] Password [facility] 06/20/2018, 3:05 PM

## 2018-06-20 NOTE — Progress Notes (Signed)
PT states during RT treatment this morning (tx terminated early per PT)- that he does not want any further nebulizer treatments. States he does not see any improvement and makes cough worse. RT left as PRN.

## 2018-06-21 DIAGNOSIS — I679 Cerebrovascular disease, unspecified: Secondary | ICD-10-CM | POA: Diagnosis not present

## 2018-06-21 DIAGNOSIS — R41841 Cognitive communication deficit: Secondary | ICD-10-CM | POA: Diagnosis not present

## 2018-06-21 DIAGNOSIS — E870 Hyperosmolality and hypernatremia: Secondary | ICD-10-CM | POA: Diagnosis not present

## 2018-06-21 DIAGNOSIS — M6282 Rhabdomyolysis: Secondary | ICD-10-CM | POA: Diagnosis not present

## 2018-06-21 DIAGNOSIS — E860 Dehydration: Secondary | ICD-10-CM | POA: Diagnosis not present

## 2018-06-21 DIAGNOSIS — M6281 Muscle weakness (generalized): Secondary | ICD-10-CM | POA: Diagnosis not present

## 2018-06-21 DIAGNOSIS — W19XXXA Unspecified fall, initial encounter: Secondary | ICD-10-CM | POA: Diagnosis not present

## 2018-06-21 DIAGNOSIS — R0902 Hypoxemia: Secondary | ICD-10-CM | POA: Diagnosis not present

## 2018-06-21 DIAGNOSIS — R69 Illness, unspecified: Secondary | ICD-10-CM | POA: Diagnosis not present

## 2018-06-21 DIAGNOSIS — S062X0D Diffuse traumatic brain injury without loss of consciousness, subsequent encounter: Secondary | ICD-10-CM | POA: Diagnosis not present

## 2018-06-21 DIAGNOSIS — R2689 Other abnormalities of gait and mobility: Secondary | ICD-10-CM | POA: Diagnosis not present

## 2018-06-21 DIAGNOSIS — R279 Unspecified lack of coordination: Secondary | ICD-10-CM | POA: Diagnosis not present

## 2018-06-21 DIAGNOSIS — R627 Adult failure to thrive: Secondary | ICD-10-CM | POA: Diagnosis not present

## 2018-06-21 DIAGNOSIS — Z9049 Acquired absence of other specified parts of digestive tract: Secondary | ICD-10-CM | POA: Diagnosis not present

## 2018-06-21 DIAGNOSIS — Z743 Need for continuous supervision: Secondary | ICD-10-CM | POA: Diagnosis not present

## 2018-06-21 DIAGNOSIS — R262 Difficulty in walking, not elsewhere classified: Secondary | ICD-10-CM | POA: Diagnosis not present

## 2018-06-21 DIAGNOSIS — I5022 Chronic systolic (congestive) heart failure: Secondary | ICD-10-CM | POA: Diagnosis not present

## 2018-06-21 DIAGNOSIS — M961 Postlaminectomy syndrome, not elsewhere classified: Secondary | ICD-10-CM | POA: Diagnosis not present

## 2018-06-21 DIAGNOSIS — R296 Repeated falls: Secondary | ICD-10-CM | POA: Diagnosis not present

## 2018-06-21 DIAGNOSIS — G9341 Metabolic encephalopathy: Secondary | ICD-10-CM | POA: Diagnosis not present

## 2018-06-21 DIAGNOSIS — K760 Fatty (change of) liver, not elsewhere classified: Secondary | ICD-10-CM | POA: Diagnosis not present

## 2018-06-21 DIAGNOSIS — I10 Essential (primary) hypertension: Secondary | ICD-10-CM | POA: Diagnosis not present

## 2018-06-21 DIAGNOSIS — I5031 Acute diastolic (congestive) heart failure: Secondary | ICD-10-CM | POA: Diagnosis not present

## 2018-06-21 MED ORDER — LOSARTAN POTASSIUM 25 MG PO TABS: 25.0000 mg | ORAL_TABLET | Freq: Every day | ORAL | 0 refills | Status: DC

## 2018-06-21 MED ORDER — CARVEDILOL 3.125 MG PO TABS: 3.1250 mg | ORAL_TABLET | Freq: Two times a day (BID) | ORAL | 0 refills | Status: DC

## 2018-06-21 MED ORDER — ASPIRIN 81 MG PO CHEW: 81.0000 mg | CHEWABLE_TABLET | Freq: Every day | ORAL | 0 refills | Status: DC

## 2018-06-21 MED ORDER — SPIRONOLACTONE 25 MG PO TABS: 12.5000 mg | ORAL_TABLET | Freq: Every day | ORAL | 0 refills | Status: DC

## 2018-06-21 NOTE — Progress Notes (Signed)
Patient is set to discharge to [place] at [facility] today. Patient & son, [name], aware. Discharge packet given to RN. [facility] scheduled for transportation at [place], [place] Worker [phone]

## 2018-06-21 NOTE — Discharge Summary (Addendum)
Physician Discharge Summary  Timothy Duncan DXI:[id number] DOB: [birth year] DOA: 06/14/2018  PCP: Caren Macadam, MD  Admit date: 06/14/2018 Discharge date: 06/21/2018  Admitted From: Home Disposition:   SNF  Recommendations for Outpatient Follow-up:  1. Follow up with PCP in 1-2 weeks 2. Please obtain BMP/CBC in one week your next doctors visit.  3. Follow-up outpatient with cardiology on January 2020, 8th 4. Started basic cardiac medications for CHF-aspirin, Coreg, losartan and Aldactone  Discharge Condition: Stable CODE STATUS:  Full  Diet recommendation: Health Healthy.   Brief/Interim Summary: 77 year old with history of essential hypertension, hyperlipidemia, DVT not on anticoagulation, CVA in 2017 was found on floor by the family at home.  Apparently has had frequent falls.  Denies any head trauma.  Family admits of some recent confusion concerning for delirium. Routine work up neg during hospitalization. Echo showed ef 25% with grade 1 diastolic dysfunction.  This was no different than his previous echocardiogram from 2017 but he never followed up with a cardiologist. Physical therapy had recommended skilled nursing facility.  I was able to make him an outpatient follow-up appoint with cardiology on July 07, 2018 at 10 AM.  I was also spoke with his son over the phone regarding his care and the importance of the following up in terms of cardiac perspective.  They understand and will follow up. Otherwise at this time he is medically stable to be discharged.    Discharge Diagnoses:  Principal Problem:   Hypernatremia Active Problems:   Essential hypertension   Acute metabolic encephalopathy   Hypokalemia   Rhabdomyolysis   Acute hypernatremia  Acute metabolic encephalopathy, greatly improved.  Dementia with intermittent delirium - CT of the head negative, normal TSH, B12, RPR, HIV - Provide supportive care.  Follow-up outpatient with neurology  Chronic systolic  congestive heart failure, ejection fraction 77-73%, grade 1 diastolic dysfunction -Currently appears to be euvolemic in nature.  He has not followed up outpatient over 2 years.  Similar echo about 2 years ago.  Medical management, we can follow-up outpatient with cardiology -appointment made for 07/07/18 10am. Family aware.  - Started on aspirin, Coreg, losartan and Aldactone  Nonsustained VT - Resolved.  Generalized weakness and failure to thrive Ambulatory dysfunction -PT recommending skilled nursing facility  Elevated bilirubin with hepatic steatosis - Ultrasound confirms hepatic steatosis  Mild rhabdomyolysis; resolved.  -Improved with fluids  History of CVA -On aspirin. Statin can be slowly resumed outpatient by his PCP.   On Lovenox for DVT prophylaxis while he was here Full code Spoke with the patient's son over the phone on the day of discharge Discharge to skilled nursing facility today.  Discharge Instructions   Allergies as of 06/21/2018   No Known Allergies     Medication List    STOP taking these medications   sildenafil 20 MG tablet Commonly known as:  REVATIO     TAKE these medications   aspirin 81 MG chewable tablet Chew 1 tablet (81 mg total) by mouth daily. Start taking on:  June 22, 2018   carvedilol 3.125 MG tablet Commonly known as:  COREG Take 1 tablet (3.125 mg total) by mouth 2 (two) times daily with a meal.   losartan 25 MG tablet Commonly known as:  COZAAR Take 1 tablet (25 mg total) by mouth daily. Start taking on:  June 22, 2018   spironolactone 25 MG tablet Commonly known as:  ALDACTONE Take 0.5 tablets (12.5 mg total) by mouth daily. Start taking on:  June 22, 2018      Follow-up Information    Nahser, Wonda Cheng, MD Follow up on 07/07/2018.   Specialty:  Cardiology Why:  Please arrive 15 minutes early for your 10:00am cardiology appointment Contact information: [address] [place]  [phone]        Caren Macadam, MD. Schedule an appointment as soon as possible for a visit in 1 week(s).   Specialty:  Family Medicine Contact information: [address] [place] [phone]          No Known Allergies  You were cared for by a hospitalist during your hospital stay. If you have any questions about your discharge medications or the care you received while you were in the hospital after you are discharged, you can call the unit and asked to speak with the hospitalist on call if the hospitalist that took care of you is not available. Once you are discharged, your primary care physician will handle any further medical issues. Please note that no refills for any discharge medications will be authorized once you are discharged, as it is imperative that you return to your primary care physician (or establish a relationship with a primary care physician if you do not have one) for your aftercare needs so that they can reassess your need for medications and monitor your lab values.    Procedures/Studies: Dg Chest 2 View  Result Date: 06/18/2018 CLINICAL DATA:  Nonproductive cough EXAM: CHEST - 2 VIEW COMPARISON:  06/15/2018, 06/11/2018 FINDINGS: No acute consolidation or pleural effusion. Borderline to mild cardiomegaly. No pneumothorax. IMPRESSION: No active cardiopulmonary disease.  Borderline to mild cardiomegaly. Electronically Signed   By: Donavan Foil M.D.   On: 06/18/2018 14:58   Dg Chest 2 View  Result Date: 06/15/2018 CLINICAL DATA:  Shortness of breath. Unwitnessed fall. EXAM: CHEST - 2 VIEW COMPARISON:  Radiographs 04/25/2016, thoracic spine radiographs 06/11/2018 FINDINGS: Lower lung volumes from prior exam. Mild cardiomegaly is similar allowing for differences in technique. No focal airspace disease, pulmonary edema, pleural effusion or pneumothorax. No acute osseous abnormalities are seen. IMPRESSION: Low lung volumes  without acute abnormality. Electronically Signed   By: Keith Rake M.D.   On: 06/15/2018 00:59   Dg Thoracic Spine 2 View  Result Date: 06/11/2018 CLINICAL DATA:  Increased back pain with movement following a fall today. The patient has some chronic pain. EXAM: THORACIC SPINE 2 VIEWS COMPARISON:  Chest radiographs dated 04/25/2016. FINDINGS: Mild multilevel degenerative changes. No fractures or subluxations are seen. The cervicothoracic junction is poorly visualized due to the thickness of the patient's shoulders. The cardiac silhouette is enlarged, accentuated by poor inspiration on the frontal view. IMPRESSION: 1. No fracture or subluxation. 2. Mild multilevel degenerative changes. 3. Cardiomegaly. Electronically Signed   By: Claudie Revering M.D.   On: 06/11/2018 01:24   Dg Lumbar Spine Complete  Result Date: 06/11/2018 CLINICAL DATA:  Increased low back pain following a fall today. Chronic low back pain. EXAM: LUMBAR SPINE - COMPLETE 4+ VIEW COMPARISON:  Lumbar spine MR dated 03/31/2016. Lumbar spine radiographs dated 09/11/2015. FINDINGS: Five non-rib-bearing lumbar vertebrae. Minimal levoconvex lumbar rotary scoliosis, unchanged. Extensive facet degenerative changes throughout the lumbar spine with no significant change in associated grade 1 spondylolisthesis at the L4-5 level. Stable marked disc space narrowing at that level with discogenic sclerosis. No fractures or pars defects are seen. IMPRESSION: No acute abnormality. Stable degenerative changes. Electronically Signed   By: Remo Lipps  Joneen Caraway M.D.   On: 06/11/2018 01:26   Dg Pelvis 1-2 Views  Result Date: 06/11/2018 CLINICAL DATA:  Buttock pain after falling on the buttocks today. EXAM: PELVIS - 1-2 VIEW COMPARISON:  None. FINDINGS: There is no evidence of pelvic fracture or diastasis. No pelvic bone lesions are seen. IMPRESSION: No fracture. Electronically Signed   By: Claudie Revering M.D.   On: 06/11/2018 01:27   Ct Head Wo Contrast  Result  Date: 06/15/2018 CLINICAL DATA:  Altered level of consciousness (LOC), unexplained EXAM: CT HEAD WITHOUT CONTRAST TECHNIQUE: Contiguous axial images were obtained from the base of the skull through the vertex without intravenous contrast. COMPARISON:  04/25/2016 FINDINGS: Brain: Mild generalized atrophy. Advanced periventricular white matter changes consistent with chronic small vessel ischemia. Remote lacunar infarcts in the basal ganglia. No intracranial hemorrhage, mass effect, or midline shift. No hydrocephalus. The basilar cisterns are patent. No evidence of territorial infarct or acute ischemia. No extra-axial or intracranial fluid collection. Vascular: Atherosclerosis of skullbase vasculature without hyperdense vessel or abnormal calcification. Skull: No fracture or focal lesion. Sinuses/Orbits: Paranasal sinuses and mastoid air cells are clear. The visualized orbits are unremarkable. Bilateral cataract resection. Other: None. IMPRESSION: 1. No acute intracranial abnormality. 2. Unchanged atrophy and moderate to advanced chronic small vessel ischemia since 2017. Electronically Signed   By: Keith Rake M.D.   On: 06/15/2018 00:53   US Abdomen Limited Ruq  Result Date: 06/17/2018 CLINICAL DATA:  77 y/o  M; increased liver function test. EXAM: ULTRASOUND ABDOMEN LIMITED RIGHT UPPER QUADRANT COMPARISON:  None. FINDINGS: Gallbladder: No gallstones or wall thickening visualized. No sonographic Murphy sign noted by sonographer. Common bile duct: Diameter: 4.8 mm Liver: Diffusely increased liver echogenicity compatible with steatosis. 2.7 cm geographic hypoechoic focus within the right lobe, probable fatty sparing. Portal vein is patent on color Doppler imaging with normal direction of blood flow towards the liver. IMPRESSION: Hepatic steatosis.  No acute process identified. Electronically Signed   By: Kristine Garbe M.D.   On: 06/17/2018 16:15     Subjective: Feels better, no new complaints.    General = no fevers, chills, dizziness, malaise, fatigue HEENT/EYES = negative for pain, redness, loss of vision, double vision, blurred vision, loss of hearing, sore throat, hoarseness, dysphagia Cardiovascular= negative for chest pain, palpitation, murmurs, lower extremity swelling Respiratory/lungs= negative for shortness of breath, cough, hemoptysis, wheezing, mucus production Gastrointestinal= negative for nausea, vomiting,, abdominal pain, melena, hematemesis Genitourinary= negative for Dysuria, Hematuria, Change in Urinary Frequency MSK = Negative for arthralgia, myalgias, Back Pain, Joint swelling  Neurology= Negative for headache, seizures, numbness, tingling  Psychiatry= Negative for anxiety, depression, suicidal and homocidal ideation Allergy/Immunology= Medication/Food allergy as listed  Skin= Negative for Rash, lesions, ulcers, itching    Discharge Exam: Vitals:   06/20/18 2114 06/21/18 0601  BP: 123/62 (!) 144/67  Pulse: 67 68  Resp: 18 16  Temp: 97.8 F (36.6 C) 97.7 F (36.5 C)  SpO2: 93% 93%   Vitals:   06/20/18 0819 06/20/18 1409 06/20/18 2114 06/21/18 0601  BP:  139/73 123/62 (!) 144/67  Pulse:  69 67 68  Resp:  (!) 22 18 16   Temp:  98.2 F (36.8 C) 97.8 F (36.6 C) 97.7 F (36.5 C)  TempSrc:  Oral Oral Oral  SpO2: 94% 94% 93% 93%  Weight:      Height:        General: Pt is alert, awake, not in acute distress Cardiovascular: RRR, S1/S2 +, no rubs, no gallops Respiratory:  CTA bilaterally, no wheezing, no rhonchi Abdominal: Soft, NT, ND, bowel sounds + Extremities: no edema, no cyanosis Generally weak appearing.    The results of significant diagnostics from this hospitalization (including imaging, microbiology, ancillary and laboratory) are listed below for reference.     Microbiology: No results found for this or any previous visit (from the past 240 hour(s)).   Labs: BNP (last 3 results) Recent Labs    06/19/18 2009  BNP 32.3    Basic Metabolic Panel: Recent Labs  Lab 06/16/18 0611 06/17/18 0541 06/18/18 1006 06/19/18 0551 06/20/18 0520  NA 143 141 140 140 141  K 3.4* 4.1 3.9 3.8 4.2  CL 114* 112* 110 109 107  CO2 22 21* 19* 15* 23  GLUCOSE 101* 96 93 105* 100*  BUN 13 9 9 10 13   CREATININE 0.89 0.79 0.82 0.83 0.92  CALCIUM 8.1* 8.3* 8.4* 8.6* 8.9  MG 2.1 2.4  --  2.2 2.5*   Liver Function Tests: Recent Labs  Lab 06/16/18 0611 06/17/18 0541 06/18/18 1006 06/19/18 0551 06/20/18 0520  AST 23 20 19 19 23   ALT 14 13 16 17 19   ALKPHOS 48 50 55 57 65  BILITOT 1.6* 2.5*  2.7* 1.6* 1.2 1.2  PROT 6.2* 6.0* 6.3* 6.8 6.7  ALBUMIN 3.2* 3.4* 3.2* 3.3* 3.5   No results for input(s): LIPASE, AMYLASE in the last 168 hours. No results for input(s): AMMONIA in the last 168 hours. CBC: Recent Labs  Lab 06/14/18 2355 06/15/18 0526 06/16/18 0611 06/17/18 0541 06/18/18 1006 06/19/18 0551 06/20/18 0520  WBC 14.8* 12.2* 11.8* 11.7* 13.6* 12.5* 11.3*  NEUTROABS 11.5* 9.3*  --   --   --   --   --   HGB 16.7 14.8 13.4 13.4 14.1 14.3 14.9  HCT 51.3 46.2 42.4 42.1 44.5 44.9 46.1  MCV 96.2 97.7 95.5 97.5 97.8 97.2 94.3  PLT 215 186 172 171 197 207 253   Cardiac Enzymes: Recent Labs  Lab 06/14/18 2355 06/15/18 0526 06/17/18 0541  CKTOTAL 714* 576* 155   BNP: Invalid input(s): POCBNP CBG: No results for input(s): GLUCAP in the last 168 hours. D-Dimer No results for input(s): DDIMER in the last 72 hours. Hgb A1c No results for input(s): HGBA1C in the last 72 hours. Lipid Profile No results for input(s): CHOL, HDL, LDLCALC, TRIG, CHOLHDL, LDLDIRECT in the last 72 hours. Thyroid function studies No results for input(s): TSH, T4TOTAL, T3FREE, THYROIDAB in the last 72 hours.  Invalid input(s): FREET3 Anemia work up No results for input(s): VITAMINB12, FOLATE, FERRITIN, TIBC, IRON, RETICCTPCT in the last 72 hours. Urinalysis    Component Value Date/Time   COLORURINE YELLOW 06/15/2018 0126    APPEARANCEUR CLEAR 06/15/2018 0126   LABSPEC 1.014 06/15/2018 0126   PHURINE 6.0 06/15/2018 0126   GLUCOSEU NEGATIVE 06/15/2018 0126   GLUCOSEU NEGATIVE 05/05/2018 1237   HGBUR SMALL (A) 06/15/2018 0126   HGBUR negative 04/19/2009 1004   BILIRUBINUR NEGATIVE 06/15/2018 0126   BILIRUBINUR n 04/28/2017 1426   KETONESUR 20 (A) 06/15/2018 0126   PROTEINUR NEGATIVE 06/15/2018 0126   UROBILINOGEN 0.2 05/05/2018 1237   NITRITE NEGATIVE 06/15/2018 0126   LEUKOCYTESUR NEGATIVE 06/15/2018 0126   Sepsis Labs Invalid input(s): PROCALCITONIN,  WBC,  LACTICIDVEN Microbiology No results found for this or any previous visit (from the past 240 hour(s)).   Time coordinating discharge:  I have spent 35 minutes face to face with the patient and on the ward discussing the patients care, assessment, plan  and disposition with other care givers. >50% of the time was devoted counseling the patient about the risks and benefits of treatment/Discharge disposition and coordinating care.   SIGNED:   Damita Lack, MD  [facility] 06/21/2018, 12:04 PM Pager   If 7PM-7AM, please contact night-coverage [email or web address] Password [email or web address]

## 2018-06-21 NOTE — Progress Notes (Addendum)
[facility] is here to transport Mr Timothy Duncan  To  [facility]. Writer phone is daughter [name] to let her know that he is being D/C'D

## 2018-06-21 NOTE — Clinical Social Work Placement (Addendum)
Insurance Authorization Approved.    CLINICAL SOCIAL WORK PLACEMENT  NOTE  Date:  06/21/2018  Patient Details  Name: Timothy Duncan MRN: [id number] Date of Birth: 12/13/1940  Clinical Social Work is seeking post-discharge placement for this patient at the [place] level of care (*CSW will initial, date and re-position this form in  chart as items are completed):  Yes   Patient/family provided with [place] Work Department's list of facilities offering this level of care within the geographic area requested by the patient (or if unable, by the patient's family).  Yes   Patient/family informed of their freedom to choose among providers that offer the needed level of care, that participate in Medicare, Medicaid or managed care program needed by the patient, have an available bed and are willing to accept the patient.  Yes   Patient/family informed of [facility]'s ownership interest in [facility] and [facility], as well as of the fact that they are under no obligation to receive care at these facilities.  PASRR submitted to EDS on       PASRR number received on       Existing PASRR number confirmed on 06/21/18     FL2 transmitted to all facilities in geographic area requested by pt/family on       FL2 transmitted to all facilities within larger geographic area on 06/18/18     Patient informed that his/her managed care company has contracts with or will negotiate with certain facilities, including the following:  [employer]     Yes   Patient/family informed of bed offers received.  Patient chooses bed at [facility]     Physician recommends and patient chooses bed at      Patient to be transferred to [facility] on 06/21/18.  Patient to be transferred to facility by PTAR      Patient family notified on 06/21/18 of transfer.  Name of family member notified:  Son-Stephen      PHYSICIAN Please prepare priority discharge summary,  including medications, Please sign FL2     Additional Comment:    _______________________________________________ Lia Hopping, LCSW 06/21/2018, 12:02 PM

## 2018-06-22 ENCOUNTER — Telehealth: Payer: Self-pay | Admitting: *Deleted

## 2018-06-22 DIAGNOSIS — R262 Difficulty in walking, not elsewhere classified: Secondary | ICD-10-CM | POA: Diagnosis not present

## 2018-06-22 DIAGNOSIS — R627 Adult failure to thrive: Secondary | ICD-10-CM | POA: Diagnosis not present

## 2018-06-22 DIAGNOSIS — G9341 Metabolic encephalopathy: Secondary | ICD-10-CM | POA: Diagnosis not present

## 2018-06-22 DIAGNOSIS — I10 Essential (primary) hypertension: Secondary | ICD-10-CM | POA: Diagnosis not present

## 2018-06-22 NOTE — Telephone Encounter (Signed)
Transition Care Management Follow-up Telephone Call   Date discharged?  How have you been since you were released from the hospital? Okay   Do you understand why you were in the hospital? yes   Do you understand the discharge instructions? yes   Where were you discharged to? SNF   Items Reviewed:  Medications reviewed: yes  Allergies reviewed: yes  Dietary changes reviewed: yes  Referrals reviewed: yes   Functional Questionnaire:   Activities of Daily Living (ADLs):   He states they are independent in the following: none States they require assistance with the following: none   Any transportation issues/concerns?: no   Any patient concerns? no   Confirmed importance and date/time of follow-up visits scheduled yes  Provider Appointment booked with  Confirmed with patient if condition begins to worsen call PCP or go to the ER.  Patient was given the office number and encouraged to call back with question or concerns.  : yes

## 2018-06-24 DIAGNOSIS — I5022 Chronic systolic (congestive) heart failure: Secondary | ICD-10-CM | POA: Diagnosis not present

## 2018-06-24 DIAGNOSIS — R69 Illness, unspecified: Secondary | ICD-10-CM | POA: Diagnosis not present

## 2018-06-24 DIAGNOSIS — G9341 Metabolic encephalopathy: Secondary | ICD-10-CM | POA: Diagnosis not present

## 2018-06-24 DIAGNOSIS — I10 Essential (primary) hypertension: Secondary | ICD-10-CM | POA: Diagnosis not present

## 2018-06-29 DIAGNOSIS — I10 Essential (primary) hypertension: Secondary | ICD-10-CM | POA: Diagnosis not present

## 2018-06-29 DIAGNOSIS — K760 Fatty (change of) liver, not elsewhere classified: Secondary | ICD-10-CM | POA: Diagnosis not present

## 2018-06-29 DIAGNOSIS — R262 Difficulty in walking, not elsewhere classified: Secondary | ICD-10-CM | POA: Diagnosis not present

## 2018-06-29 DIAGNOSIS — I5031 Acute diastolic (congestive) heart failure: Secondary | ICD-10-CM | POA: Diagnosis not present

## 2018-07-06 ENCOUNTER — Encounter: Payer: Self-pay | Admitting: Cardiovascular Disease

## 2018-07-06 DIAGNOSIS — I5031 Acute diastolic (congestive) heart failure: Secondary | ICD-10-CM | POA: Diagnosis not present

## 2018-07-06 DIAGNOSIS — R627 Adult failure to thrive: Secondary | ICD-10-CM | POA: Diagnosis not present

## 2018-07-06 DIAGNOSIS — R262 Difficulty in walking, not elsewhere classified: Secondary | ICD-10-CM | POA: Diagnosis not present

## 2018-07-06 DIAGNOSIS — K760 Fatty (change of) liver, not elsewhere classified: Secondary | ICD-10-CM | POA: Diagnosis not present

## 2018-07-07 ENCOUNTER — Ambulatory Visit (INDEPENDENT_AMBULATORY_CARE_PROVIDER_SITE_OTHER): Payer: Medicare HMO | Admitting: Cardiovascular Disease

## 2018-07-07 ENCOUNTER — Encounter: Payer: Self-pay | Admitting: Cardiovascular Disease

## 2018-07-07 VITALS — BP 132/76 | HR 78 | Ht 70.0 in | Wt 237.1 lb

## 2018-07-07 DIAGNOSIS — I10 Essential (primary) hypertension: Secondary | ICD-10-CM

## 2018-07-07 DIAGNOSIS — I5022 Chronic systolic (congestive) heart failure: Secondary | ICD-10-CM | POA: Diagnosis not present

## 2018-07-07 LAB — BASIC METABOLIC PANEL
BUN/Creatinine Ratio: 11 (ref 10–24)
BUN: 12 mg/dL (ref 8–27)
CO2: 24 mmol/L (ref 20–29)
Calcium: 9.7 mg/dL (ref 8.6–10.2)
Chloride: 105 mmol/L (ref 96–106)
Creatinine, Ser: 1.11 mg/dL (ref 0.76–1.27)
GFR calc Af Amer: 74 mL/min/{1.73_m2} (ref >59)
GFR calc non Af Amer: 64 mL/min/{1.73_m2} (ref >59)
GLUCOSE: 97 mg/dL (ref 65–99)
Potassium: 4.1 mmol/L (ref 3.5–5.2)
Sodium: 141 mmol/L (ref 134–144)

## 2018-07-07 MED ORDER — CARVEDILOL 6.25 MG PO TABS: 6.2500 mg | ORAL_TABLET | Freq: Two times a day (BID) | ORAL | 3 refills | Status: AC

## 2018-07-07 MED ORDER — LOSARTAN POTASSIUM 25 MG PO TABS: 25.0000 mg | ORAL_TABLET | Freq: Every day | ORAL | 3 refills | Status: AC

## 2018-07-07 MED ORDER — SPIRONOLACTONE 25 MG PO TABS: 12.5000 mg | ORAL_TABLET | Freq: Every day | ORAL | 3 refills | Status: AC

## 2018-07-07 MED ORDER — ASPIRIN 81 MG PO CHEW: 81.0000 mg | CHEWABLE_TABLET | Freq: Every day | ORAL | 0 refills | Status: AC

## 2018-07-07 NOTE — Patient Instructions (Addendum)
Medication Instructions:  1) INCREASE COREG to 6.25 mg twice daily Otherwise, take all other medications as you have been. Your medication list is the last page of this packet.  Labwork: TODAY: BMET  Testing/Procedures: None  Follow-Up: You have an appointment with Richardson Dopp, PA on October 11, 2018 at 10:15AM.

## 2018-07-07 NOTE — Progress Notes (Signed)
Cardiology Office Note:    Date:  07/07/2018   ID:  Timothy Duncan, DOB July 17, 1940, MRN [id number]  PCP:  Caren Macadam, MD  Cardiologist:  No primary care provider on file.  Electrophysiologist:  None   Referring MD: Caren Macadam, MD   Problem list 1.  Chronic combined systolic and diastolic congestive heart failure 2.  Hypertension 3.  Hyperlipidemia 4.  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  History of CVA   Chief Complaint  Patient presents with  . Congestive Heart Failure     History of Present Illness:    Timothy Duncan is a 78 y.o. male with a hx of  HTN,  He has sees his doctor Was seen in the wheelchair.   Is not typically in a wheelchair.  Is here alone - clearly has some degree of dementia and poor understanding of his medical issues Lives in a SNR now,   Was dropped off by a driver    He was [facility] to [facility] several weeks after falling . Is able to walk around the nursing home to some degree.  I do not have any records about his physical therapy treatments or progress.  Was examined in the wheelchair Is here in shorts and tee shirt ( 40 degrees outside  this am)    Denies any cp or dyspnea  Family in [place] .   Mental status Ok on date, president ,    Age, location .  Place of birth   Says he walking with PT   He told my nursing assistant that he was not taking his medicines.  He insists that he is taking his medicines.  It is clear that he has some degree of dementia.   Past Medical History:  Diagnosis Date  . DVT (deep venous thrombosis) ([place]) 07/2015   RLE  . ED (erectile dysfunction)   . Hypertension   . Pulmonary embolism ([place])   . TIA (transient ischemic attack)     Past Surgical History:  Procedure Laterality Date  . APPENDECTOMY    . CATARACT EXTRACTION W/ INTRAOCULAR LENS  IMPLANT, BILATERAL Bilateral   . COLONOSCOPY    . LUMBAR LAMINECTOMY/DECOMPRESSION MICRODISCECTOMY Right 05/03/2015   Procedure: Microdiscectomy - Lumbar four-Lumbar five - right ;  Surgeon: Eustace Moore, MD;  Location: [place];  Service: Neurosurgery;  Laterality: Right;  . TONSILLECTOMY    . WOUND EXPLORATION N/A 06/06/2015   Procedure: Presumed discitis, Lumbar exploration,Repeat discectomy Lumbar four-five with deep tissue culture;  Surgeon: Eustace Moore, MD;  Location: [place] [facility];  Service: Neurosurgery;  Laterality: N/A;    Current Medications: No outpatient medications have been marked as taking for the 07/07/18 encounter (Office Visit) with Zekiel Torian, Wonda Cheng, MD.     Allergies:   Patient has no known allergies.   Social History   Socioeconomic History  . Marital status: Divorced    Spouse name: Not on file  . Number of children: Not on file  . Years of education: Not on file  . Highest education level: Not on file  Occupational History  . Not on file  Social Needs  . Financial resource strain: Not on file  . Food insecurity:    Worry: Not on file    Inability: Not on file  . Transportation needs:    Medical: Not on file    Non-medical: Not on file  Tobacco Use  . Smoking status: Former Smoker  Packs/day: 3.00    Years: 30.00    Pack years: 90.00    Types: Cigarettes  . Smokeless tobacco: Former Systems developer    Types: Snuff, Chew  . Tobacco comment: "quit smoking in the 1980s; used chew and snuff off and on, none for awhile"  Substance and Sexual Activity  . Alcohol use: No    Alcohol/week: 0.0 standard drinks    Comment: 03/31/2016 "no drinking since 06/22/2005"  . Drug use: No  . Sexual activity: Not Currently    Partners: Male  Lifestyle  . Physical activity:    Days per week: Not on file    Minutes per session: Not on file  . Stress: Not on file  Relationships  . Social connections:    Talks on phone: Not on file    Gets together: Not on file    Attends religious service: Not on file    Active member of club or organization: Not on file    Attends meetings of clubs  or organizations: Not on file    Relationship status: Not on file  Other Topics Concern  . Not on file  Social History Narrative  . Not on file     Family History: The patient's family history includes Alcohol abuse in his maternal grandfather; Early death in his father.  ROS:      EKGs/Labs/Other Studies Reviewed:     EKG:   Jan. 8  , 2020: Normal sinus rhythm at 76.  Left bundle branch block.   Recent Labs: 06/15/2018: TSH 2.165 06/19/2018: B Natriuretic Peptide 88.0 06/20/2018: ALT 19; BUN 13; Creatinine, Ser 0.92; Hemoglobin 14.9; Magnesium 2.5; Platelets 253; Potassium 4.2; Sodium 141  Recent Lipid Panel    Component Value Date/Time   CHOL 183 05/05/2018 1237   TRIG 153.0 (H) 05/05/2018 1237   TRIG 149 07/06/2006 0926   HDL 51.30 05/05/2018 1237   CHOLHDL 4 05/05/2018 1237   VLDL 30.6 05/05/2018 1237   LDLCALC 101 (H) 05/05/2018 1237   LDLDIRECT 115.9 07/06/2006 0926    Physical Exam:    VS:  BP 132/76   Pulse 78   Ht 5\' 10"  (1.778 m)   Wt 237 lb 1.9 oz (107.6 kg)   SpO2 97%   BMI 34.02 kg/m     Wt Readings from Last 3 Encounters:  07/07/18 237 lb 1.9 oz (107.6 kg)  06/14/18 230 lb (104.3 kg)  05/05/18 250 lb 4.8 oz (113.5 kg)     GEN: Middle-aged gentleman.  He seems to be somewhat disoriented.  That he has at least mild dementia. HEENT: Normal NECK: No JVD; No carotid bruits LYMPHATICS: No lymphadenopathy CARDIAC: Rate S1-S2. RESPIRATORY:  Clear to auscultation without rales, wheezing or rhonchi  ABDOMEN: Soft, non-tender, non-distended MUSCULOSKELETAL:  No edema; No deformity  SKIN: Warm and dry NEUROLOGIC: He was examined in the wheelchair.  He has a history of falling and was not strong enough to stand up. He did okay on many many mental status exam.  But it is clear his understanding of his medical issues is quite limited. PSYCHIATRIC: difficult to assess  ASSESSMENT:    1. Chronic systolic heart failure (HCC)    PLAN:    In order of  problems listed above:  1. Chronic combined congestive heart failure: The patient has had reduced left ventricular systolic function for many years.  He was recently seen in the hospital by internal medicine following a fall and they start him on carvedilol, losartan and Aldactone. He  has been in a nursing home since that time. We will increase the Coreg to 6.25 mg twice a day.  He is not clear to me that he is all that functional.  It is clear that he is not able to live by himself.  At this point I would have a very nonaggressive approach to working up his congestive heart failure.  He is not a Candidate at this point.  We will continue with medical therapy.  He needs to work with physical therapy with ambulation.  It is not clear to me whether or not he can ambulate.  I did not attempt this in the office because he says that he falls frequently.  Patient has some family who lives in [place].  It would be best that this patient not come to the doctor by himself as Im  not sure that he has a good understanding of his medical issues.  Medication Adjustments/Labs and Tests Ordered: Current medicines are reviewed at length with the patient today.  Concerns regarding medicines are outlined above.  Orders Placed This Encounter  Procedures  . EKG 12-Lead   Meds ordered this encounter  Medications  . carvedilol (COREG) 6.25 MG tablet    Sig: Take 1 tablet (6.25 mg total) by mouth 2 (two) times daily with a meal.    Dispense:  180 tablet    Refill:  3  . losartan (COZAAR) 25 MG tablet    Sig: Take 1 tablet (25 mg total) by mouth daily.    Dispense:  90 tablet    Refill:  3  . spironolactone (ALDACTONE) 25 MG tablet    Sig: Take 0.5 tablets (12.5 mg total) by mouth daily.    Dispense:  45 tablet    Refill:  3  . aspirin 81 MG chewable tablet    Sig: Chew 1 tablet (81 mg total) by mouth daily.    Dispense:  30 tablet    Refill:  0    Patient Instructions  Medication Instructions:    1) INCREASE COREG to 6.25 mg twice daily Otherwise, take all other medications as you have been. Your medication list is the last page of this packet.  Labwork: None  Testing/Procedures: None  Follow-Up: You have an appointment with Richardson Dopp, PA on October 11, 2018 at 10:15AM.     Signed, Mertie Moores, MD  07/07/2018 11:03 AM    [place]

## 2018-07-13 DIAGNOSIS — R262 Difficulty in walking, not elsewhere classified: Secondary | ICD-10-CM | POA: Diagnosis not present

## 2018-07-13 DIAGNOSIS — R627 Adult failure to thrive: Secondary | ICD-10-CM | POA: Diagnosis not present

## 2018-07-13 DIAGNOSIS — I10 Essential (primary) hypertension: Secondary | ICD-10-CM | POA: Diagnosis not present

## 2018-07-13 DIAGNOSIS — I5031 Acute diastolic (congestive) heart failure: Secondary | ICD-10-CM | POA: Diagnosis not present

## 2018-07-18 DIAGNOSIS — R2689 Other abnormalities of gait and mobility: Secondary | ICD-10-CM | POA: Diagnosis not present

## 2018-07-18 DIAGNOSIS — M6282 Rhabdomyolysis: Secondary | ICD-10-CM | POA: Diagnosis not present

## 2018-07-18 DIAGNOSIS — R69 Illness, unspecified: Secondary | ICD-10-CM | POA: Diagnosis not present

## 2018-07-18 DIAGNOSIS — E870 Hyperosmolality and hypernatremia: Secondary | ICD-10-CM | POA: Diagnosis not present

## 2018-07-18 DIAGNOSIS — I679 Cerebrovascular disease, unspecified: Secondary | ICD-10-CM | POA: Diagnosis not present

## 2018-07-18 DIAGNOSIS — E860 Dehydration: Secondary | ICD-10-CM | POA: Diagnosis not present

## 2018-07-18 DIAGNOSIS — G9341 Metabolic encephalopathy: Secondary | ICD-10-CM | POA: Diagnosis not present

## 2018-07-18 DIAGNOSIS — R41841 Cognitive communication deficit: Secondary | ICD-10-CM | POA: Diagnosis not present

## 2018-07-18 DIAGNOSIS — M961 Postlaminectomy syndrome, not elsewhere classified: Secondary | ICD-10-CM | POA: Diagnosis not present

## 2018-07-18 DIAGNOSIS — M6281 Muscle weakness (generalized): Secondary | ICD-10-CM | POA: Diagnosis not present

## 2018-07-19 DIAGNOSIS — I10 Essential (primary) hypertension: Secondary | ICD-10-CM | POA: Diagnosis not present

## 2018-07-19 DIAGNOSIS — G9341 Metabolic encephalopathy: Secondary | ICD-10-CM | POA: Diagnosis not present

## 2018-07-19 DIAGNOSIS — I679 Cerebrovascular disease, unspecified: Secondary | ICD-10-CM | POA: Diagnosis not present

## 2018-07-19 DIAGNOSIS — M6281 Muscle weakness (generalized): Secondary | ICD-10-CM | POA: Diagnosis not present

## 2018-07-19 DIAGNOSIS — E860 Dehydration: Secondary | ICD-10-CM | POA: Diagnosis not present

## 2018-07-19 DIAGNOSIS — R41841 Cognitive communication deficit: Secondary | ICD-10-CM | POA: Diagnosis not present

## 2018-07-19 DIAGNOSIS — I5031 Acute diastolic (congestive) heart failure: Secondary | ICD-10-CM | POA: Diagnosis not present

## 2018-07-19 DIAGNOSIS — R69 Illness, unspecified: Secondary | ICD-10-CM | POA: Diagnosis not present

## 2018-07-19 DIAGNOSIS — M961 Postlaminectomy syndrome, not elsewhere classified: Secondary | ICD-10-CM | POA: Diagnosis not present

## 2018-07-19 DIAGNOSIS — M6282 Rhabdomyolysis: Secondary | ICD-10-CM | POA: Diagnosis not present

## 2018-07-19 DIAGNOSIS — E870 Hyperosmolality and hypernatremia: Secondary | ICD-10-CM | POA: Diagnosis not present

## 2018-07-19 DIAGNOSIS — R2689 Other abnormalities of gait and mobility: Secondary | ICD-10-CM | POA: Diagnosis not present

## 2018-07-19 DIAGNOSIS — R627 Adult failure to thrive: Secondary | ICD-10-CM | POA: Diagnosis not present

## 2018-07-19 DIAGNOSIS — R262 Difficulty in walking, not elsewhere classified: Secondary | ICD-10-CM | POA: Diagnosis not present

## 2018-07-21 DIAGNOSIS — M6281 Muscle weakness (generalized): Secondary | ICD-10-CM | POA: Diagnosis not present

## 2018-07-21 DIAGNOSIS — R0602 Shortness of breath: Secondary | ICD-10-CM | POA: Diagnosis not present

## 2018-07-21 DIAGNOSIS — R269 Unspecified abnormalities of gait and mobility: Secondary | ICD-10-CM | POA: Diagnosis not present

## 2018-07-21 DIAGNOSIS — I5022 Chronic systolic (congestive) heart failure: Secondary | ICD-10-CM | POA: Diagnosis not present

## 2018-07-23 ENCOUNTER — Inpatient Hospital Stay: Payer: Medicare HMO | Admitting: Family Medicine

## 2018-07-23 DIAGNOSIS — R69 Illness, unspecified: Secondary | ICD-10-CM | POA: Diagnosis not present

## 2018-07-23 DIAGNOSIS — I5031 Acute diastolic (congestive) heart failure: Secondary | ICD-10-CM | POA: Diagnosis not present

## 2018-07-23 DIAGNOSIS — L570 Actinic keratosis: Secondary | ICD-10-CM | POA: Diagnosis not present

## 2018-07-23 DIAGNOSIS — E782 Mixed hyperlipidemia: Secondary | ICD-10-CM | POA: Diagnosis not present

## 2018-07-23 DIAGNOSIS — M961 Postlaminectomy syndrome, not elsewhere classified: Secondary | ICD-10-CM | POA: Diagnosis not present

## 2018-07-23 DIAGNOSIS — Z86718 Personal history of other venous thrombosis and embolism: Secondary | ICD-10-CM | POA: Diagnosis not present

## 2018-07-23 DIAGNOSIS — I679 Cerebrovascular disease, unspecified: Secondary | ICD-10-CM | POA: Diagnosis not present

## 2018-07-23 DIAGNOSIS — K760 Fatty (change of) liver, not elsewhere classified: Secondary | ICD-10-CM | POA: Diagnosis not present

## 2018-07-23 DIAGNOSIS — M545 Low back pain: Secondary | ICD-10-CM | POA: Diagnosis not present

## 2018-07-23 DIAGNOSIS — I110 Hypertensive heart disease with heart failure: Secondary | ICD-10-CM | POA: Diagnosis not present

## 2018-07-23 NOTE — Progress Notes (Deleted)
  Timothy Duncan DOB: 09-May-1941 Encounter date: 07/23/2018  This is a 78 y.o. male who presents with No chief complaint on file.   History of present illness: Recent hospital admission 06/14/18 -06/21/18 and discharged to SNF. Recommended follow up CBC and BMP. Admitted after fall at home with concern per family of recent confusion. CT head negative. Follow up neurology recommended and dementia with intermittent delirium diagnosed. I do not see any pending neurology appointment. Following up with cardiology for chronic systolic HF w EF 67%. Was started on ASA, Coreg, losartan, and aldactone. PT was started for generalized weakness/FTT. [place] confirmed hepatic steatosis (compelte due to elevated bilirubin). Mild rhabdo improved with fluids. Consideration for statin restart by PCP recommended at discharge.Marland Kitchen  BMP completed 1/8 by cardiology revealed normalization of electrolytes and normal renal function.  Of note bilirubin was normal on last cmp from 12/22. WBC was elevated on last CBC 12/22. HPI   No Known Allergies No outpatient medications have been marked as taking for the 07/23/18 encounter (Appointment) with Caren Macadam, MD.    Review of Systems  Objective:  There were no vitals taken for this visit.      BP Readings from Last 3 Encounters:  07/07/18 132/76  06/21/18 129/65  06/11/18 139/81   Wt Readings from Last 3 Encounters:  07/07/18 237 lb 1.9 oz (107.6 kg)  06/14/18 230 lb (104.3 kg)  05/05/18 250 lb 4.8 oz (113.5 kg)    Physical Exam  Assessment/Plan  There are no diagnoses linked to this encounter.       Micheline Rough, MD

## 2018-07-26 DIAGNOSIS — I679 Cerebrovascular disease, unspecified: Secondary | ICD-10-CM | POA: Diagnosis not present

## 2018-07-26 DIAGNOSIS — K760 Fatty (change of) liver, not elsewhere classified: Secondary | ICD-10-CM | POA: Diagnosis not present

## 2018-07-26 DIAGNOSIS — L570 Actinic keratosis: Secondary | ICD-10-CM | POA: Diagnosis not present

## 2018-07-26 DIAGNOSIS — M961 Postlaminectomy syndrome, not elsewhere classified: Secondary | ICD-10-CM | POA: Diagnosis not present

## 2018-07-26 DIAGNOSIS — E782 Mixed hyperlipidemia: Secondary | ICD-10-CM | POA: Diagnosis not present

## 2018-07-26 DIAGNOSIS — I110 Hypertensive heart disease with heart failure: Secondary | ICD-10-CM | POA: Diagnosis not present

## 2018-07-26 DIAGNOSIS — Z86718 Personal history of other venous thrombosis and embolism: Secondary | ICD-10-CM | POA: Diagnosis not present

## 2018-07-26 DIAGNOSIS — R69 Illness, unspecified: Secondary | ICD-10-CM | POA: Diagnosis not present

## 2018-07-26 DIAGNOSIS — I5031 Acute diastolic (congestive) heart failure: Secondary | ICD-10-CM | POA: Diagnosis not present

## 2018-07-26 DIAGNOSIS — M545 Low back pain: Secondary | ICD-10-CM | POA: Diagnosis not present

## 2018-07-27 DIAGNOSIS — Z86718 Personal history of other venous thrombosis and embolism: Secondary | ICD-10-CM | POA: Diagnosis not present

## 2018-07-27 DIAGNOSIS — K760 Fatty (change of) liver, not elsewhere classified: Secondary | ICD-10-CM | POA: Diagnosis not present

## 2018-07-27 DIAGNOSIS — E782 Mixed hyperlipidemia: Secondary | ICD-10-CM | POA: Diagnosis not present

## 2018-07-27 DIAGNOSIS — I5031 Acute diastolic (congestive) heart failure: Secondary | ICD-10-CM | POA: Diagnosis not present

## 2018-07-27 DIAGNOSIS — I110 Hypertensive heart disease with heart failure: Secondary | ICD-10-CM | POA: Diagnosis not present

## 2018-07-27 DIAGNOSIS — L570 Actinic keratosis: Secondary | ICD-10-CM | POA: Diagnosis not present

## 2018-07-27 DIAGNOSIS — I679 Cerebrovascular disease, unspecified: Secondary | ICD-10-CM | POA: Diagnosis not present

## 2018-07-27 DIAGNOSIS — R69 Illness, unspecified: Secondary | ICD-10-CM | POA: Diagnosis not present

## 2018-07-27 DIAGNOSIS — M545 Low back pain: Secondary | ICD-10-CM | POA: Diagnosis not present

## 2018-07-27 DIAGNOSIS — M961 Postlaminectomy syndrome, not elsewhere classified: Secondary | ICD-10-CM | POA: Diagnosis not present

## 2018-07-30 DIAGNOSIS — Z86718 Personal history of other venous thrombosis and embolism: Secondary | ICD-10-CM | POA: Diagnosis not present

## 2018-07-30 DIAGNOSIS — M545 Low back pain: Secondary | ICD-10-CM | POA: Diagnosis not present

## 2018-07-30 DIAGNOSIS — I110 Hypertensive heart disease with heart failure: Secondary | ICD-10-CM | POA: Diagnosis not present

## 2018-07-30 DIAGNOSIS — M961 Postlaminectomy syndrome, not elsewhere classified: Secondary | ICD-10-CM | POA: Diagnosis not present

## 2018-07-30 DIAGNOSIS — L570 Actinic keratosis: Secondary | ICD-10-CM | POA: Diagnosis not present

## 2018-07-30 DIAGNOSIS — I679 Cerebrovascular disease, unspecified: Secondary | ICD-10-CM | POA: Diagnosis not present

## 2018-07-30 DIAGNOSIS — I5031 Acute diastolic (congestive) heart failure: Secondary | ICD-10-CM | POA: Diagnosis not present

## 2018-07-30 DIAGNOSIS — K760 Fatty (change of) liver, not elsewhere classified: Secondary | ICD-10-CM | POA: Diagnosis not present

## 2018-07-30 DIAGNOSIS — R69 Illness, unspecified: Secondary | ICD-10-CM | POA: Diagnosis not present

## 2018-07-30 DIAGNOSIS — E782 Mixed hyperlipidemia: Secondary | ICD-10-CM | POA: Diagnosis not present

## 2018-08-03 DIAGNOSIS — R69 Illness, unspecified: Secondary | ICD-10-CM | POA: Diagnosis not present

## 2018-08-03 DIAGNOSIS — I110 Hypertensive heart disease with heart failure: Secondary | ICD-10-CM | POA: Diagnosis not present

## 2018-08-03 DIAGNOSIS — I5031 Acute diastolic (congestive) heart failure: Secondary | ICD-10-CM | POA: Diagnosis not present

## 2018-08-03 DIAGNOSIS — K760 Fatty (change of) liver, not elsewhere classified: Secondary | ICD-10-CM | POA: Diagnosis not present

## 2018-08-03 DIAGNOSIS — I679 Cerebrovascular disease, unspecified: Secondary | ICD-10-CM | POA: Diagnosis not present

## 2018-08-03 DIAGNOSIS — E782 Mixed hyperlipidemia: Secondary | ICD-10-CM | POA: Diagnosis not present

## 2018-08-03 DIAGNOSIS — M961 Postlaminectomy syndrome, not elsewhere classified: Secondary | ICD-10-CM | POA: Diagnosis not present

## 2018-08-03 DIAGNOSIS — Z86718 Personal history of other venous thrombosis and embolism: Secondary | ICD-10-CM | POA: Diagnosis not present

## 2018-08-03 DIAGNOSIS — M545 Low back pain: Secondary | ICD-10-CM | POA: Diagnosis not present

## 2018-08-03 DIAGNOSIS — L570 Actinic keratosis: Secondary | ICD-10-CM | POA: Diagnosis not present

## 2018-08-04 DIAGNOSIS — Z86718 Personal history of other venous thrombosis and embolism: Secondary | ICD-10-CM | POA: Diagnosis not present

## 2018-08-04 DIAGNOSIS — L570 Actinic keratosis: Secondary | ICD-10-CM | POA: Diagnosis not present

## 2018-08-04 DIAGNOSIS — K760 Fatty (change of) liver, not elsewhere classified: Secondary | ICD-10-CM | POA: Diagnosis not present

## 2018-08-04 DIAGNOSIS — I5031 Acute diastolic (congestive) heart failure: Secondary | ICD-10-CM | POA: Diagnosis not present

## 2018-08-04 DIAGNOSIS — M545 Low back pain: Secondary | ICD-10-CM | POA: Diagnosis not present

## 2018-08-04 DIAGNOSIS — I110 Hypertensive heart disease with heart failure: Secondary | ICD-10-CM | POA: Diagnosis not present

## 2018-08-04 DIAGNOSIS — I679 Cerebrovascular disease, unspecified: Secondary | ICD-10-CM | POA: Diagnosis not present

## 2018-08-04 DIAGNOSIS — M961 Postlaminectomy syndrome, not elsewhere classified: Secondary | ICD-10-CM | POA: Diagnosis not present

## 2018-08-04 DIAGNOSIS — E782 Mixed hyperlipidemia: Secondary | ICD-10-CM | POA: Diagnosis not present

## 2018-08-04 DIAGNOSIS — R69 Illness, unspecified: Secondary | ICD-10-CM | POA: Diagnosis not present

## 2018-08-06 DIAGNOSIS — M545 Low back pain: Secondary | ICD-10-CM | POA: Diagnosis not present

## 2018-08-06 DIAGNOSIS — Z86718 Personal history of other venous thrombosis and embolism: Secondary | ICD-10-CM | POA: Diagnosis not present

## 2018-08-06 DIAGNOSIS — I679 Cerebrovascular disease, unspecified: Secondary | ICD-10-CM | POA: Diagnosis not present

## 2018-08-06 DIAGNOSIS — M961 Postlaminectomy syndrome, not elsewhere classified: Secondary | ICD-10-CM | POA: Diagnosis not present

## 2018-08-06 DIAGNOSIS — L570 Actinic keratosis: Secondary | ICD-10-CM | POA: Diagnosis not present

## 2018-08-06 DIAGNOSIS — I110 Hypertensive heart disease with heart failure: Secondary | ICD-10-CM | POA: Diagnosis not present

## 2018-08-06 DIAGNOSIS — K760 Fatty (change of) liver, not elsewhere classified: Secondary | ICD-10-CM | POA: Diagnosis not present

## 2018-08-06 DIAGNOSIS — R69 Illness, unspecified: Secondary | ICD-10-CM | POA: Diagnosis not present

## 2018-08-06 DIAGNOSIS — E782 Mixed hyperlipidemia: Secondary | ICD-10-CM | POA: Diagnosis not present

## 2018-08-06 DIAGNOSIS — I5031 Acute diastolic (congestive) heart failure: Secondary | ICD-10-CM | POA: Diagnosis not present

## 2018-08-09 DIAGNOSIS — I110 Hypertensive heart disease with heart failure: Secondary | ICD-10-CM | POA: Diagnosis not present

## 2018-08-09 DIAGNOSIS — E782 Mixed hyperlipidemia: Secondary | ICD-10-CM | POA: Diagnosis not present

## 2018-08-09 DIAGNOSIS — R69 Illness, unspecified: Secondary | ICD-10-CM | POA: Diagnosis not present

## 2018-08-09 DIAGNOSIS — I679 Cerebrovascular disease, unspecified: Secondary | ICD-10-CM | POA: Diagnosis not present

## 2018-08-09 DIAGNOSIS — I5031 Acute diastolic (congestive) heart failure: Secondary | ICD-10-CM | POA: Diagnosis not present

## 2018-08-09 DIAGNOSIS — K760 Fatty (change of) liver, not elsewhere classified: Secondary | ICD-10-CM | POA: Diagnosis not present

## 2018-08-09 DIAGNOSIS — M961 Postlaminectomy syndrome, not elsewhere classified: Secondary | ICD-10-CM | POA: Diagnosis not present

## 2018-08-09 DIAGNOSIS — Z86718 Personal history of other venous thrombosis and embolism: Secondary | ICD-10-CM | POA: Diagnosis not present

## 2018-08-09 DIAGNOSIS — M545 Low back pain: Secondary | ICD-10-CM | POA: Diagnosis not present

## 2018-08-09 DIAGNOSIS — L570 Actinic keratosis: Secondary | ICD-10-CM | POA: Diagnosis not present

## 2018-08-10 DIAGNOSIS — L570 Actinic keratosis: Secondary | ICD-10-CM | POA: Diagnosis not present

## 2018-08-10 DIAGNOSIS — M961 Postlaminectomy syndrome, not elsewhere classified: Secondary | ICD-10-CM | POA: Diagnosis not present

## 2018-08-10 DIAGNOSIS — I679 Cerebrovascular disease, unspecified: Secondary | ICD-10-CM | POA: Diagnosis not present

## 2018-08-10 DIAGNOSIS — Z86718 Personal history of other venous thrombosis and embolism: Secondary | ICD-10-CM | POA: Diagnosis not present

## 2018-08-10 DIAGNOSIS — M545 Low back pain: Secondary | ICD-10-CM | POA: Diagnosis not present

## 2018-08-10 DIAGNOSIS — E782 Mixed hyperlipidemia: Secondary | ICD-10-CM | POA: Diagnosis not present

## 2018-08-10 DIAGNOSIS — I5031 Acute diastolic (congestive) heart failure: Secondary | ICD-10-CM | POA: Diagnosis not present

## 2018-08-10 DIAGNOSIS — R69 Illness, unspecified: Secondary | ICD-10-CM | POA: Diagnosis not present

## 2018-08-10 DIAGNOSIS — K760 Fatty (change of) liver, not elsewhere classified: Secondary | ICD-10-CM | POA: Diagnosis not present

## 2018-08-10 DIAGNOSIS — I110 Hypertensive heart disease with heart failure: Secondary | ICD-10-CM | POA: Diagnosis not present

## 2018-08-11 DIAGNOSIS — R69 Illness, unspecified: Secondary | ICD-10-CM | POA: Diagnosis not present

## 2018-08-11 DIAGNOSIS — Z86718 Personal history of other venous thrombosis and embolism: Secondary | ICD-10-CM | POA: Diagnosis not present

## 2018-08-11 DIAGNOSIS — I679 Cerebrovascular disease, unspecified: Secondary | ICD-10-CM | POA: Diagnosis not present

## 2018-08-11 DIAGNOSIS — K760 Fatty (change of) liver, not elsewhere classified: Secondary | ICD-10-CM | POA: Diagnosis not present

## 2018-08-11 DIAGNOSIS — M545 Low back pain: Secondary | ICD-10-CM | POA: Diagnosis not present

## 2018-08-11 DIAGNOSIS — L570 Actinic keratosis: Secondary | ICD-10-CM | POA: Diagnosis not present

## 2018-08-11 DIAGNOSIS — M961 Postlaminectomy syndrome, not elsewhere classified: Secondary | ICD-10-CM | POA: Diagnosis not present

## 2018-08-11 DIAGNOSIS — E782 Mixed hyperlipidemia: Secondary | ICD-10-CM | POA: Diagnosis not present

## 2018-08-11 DIAGNOSIS — I110 Hypertensive heart disease with heart failure: Secondary | ICD-10-CM | POA: Diagnosis not present

## 2018-08-11 DIAGNOSIS — I5031 Acute diastolic (congestive) heart failure: Secondary | ICD-10-CM | POA: Diagnosis not present

## 2018-08-18 DIAGNOSIS — I679 Cerebrovascular disease, unspecified: Secondary | ICD-10-CM | POA: Diagnosis not present

## 2018-08-18 DIAGNOSIS — Z86718 Personal history of other venous thrombosis and embolism: Secondary | ICD-10-CM | POA: Diagnosis not present

## 2018-08-18 DIAGNOSIS — K760 Fatty (change of) liver, not elsewhere classified: Secondary | ICD-10-CM | POA: Diagnosis not present

## 2018-08-18 DIAGNOSIS — I110 Hypertensive heart disease with heart failure: Secondary | ICD-10-CM | POA: Diagnosis not present

## 2018-08-18 DIAGNOSIS — I5031 Acute diastolic (congestive) heart failure: Secondary | ICD-10-CM | POA: Diagnosis not present

## 2018-08-18 DIAGNOSIS — L570 Actinic keratosis: Secondary | ICD-10-CM | POA: Diagnosis not present

## 2018-08-18 DIAGNOSIS — E782 Mixed hyperlipidemia: Secondary | ICD-10-CM | POA: Diagnosis not present

## 2018-08-18 DIAGNOSIS — M545 Low back pain: Secondary | ICD-10-CM | POA: Diagnosis not present

## 2018-08-18 DIAGNOSIS — M961 Postlaminectomy syndrome, not elsewhere classified: Secondary | ICD-10-CM | POA: Diagnosis not present

## 2018-08-18 DIAGNOSIS — R69 Illness, unspecified: Secondary | ICD-10-CM | POA: Diagnosis not present

## 2018-08-20 DIAGNOSIS — M545 Low back pain: Secondary | ICD-10-CM | POA: Diagnosis not present

## 2018-08-20 DIAGNOSIS — I110 Hypertensive heart disease with heart failure: Secondary | ICD-10-CM | POA: Diagnosis not present

## 2018-08-20 DIAGNOSIS — Z86718 Personal history of other venous thrombosis and embolism: Secondary | ICD-10-CM | POA: Diagnosis not present

## 2018-08-20 DIAGNOSIS — I5031 Acute diastolic (congestive) heart failure: Secondary | ICD-10-CM | POA: Diagnosis not present

## 2018-08-20 DIAGNOSIS — K760 Fatty (change of) liver, not elsewhere classified: Secondary | ICD-10-CM | POA: Diagnosis not present

## 2018-08-20 DIAGNOSIS — M961 Postlaminectomy syndrome, not elsewhere classified: Secondary | ICD-10-CM | POA: Diagnosis not present

## 2018-08-20 DIAGNOSIS — E782 Mixed hyperlipidemia: Secondary | ICD-10-CM | POA: Diagnosis not present

## 2018-08-20 DIAGNOSIS — R69 Illness, unspecified: Secondary | ICD-10-CM | POA: Diagnosis not present

## 2018-08-20 DIAGNOSIS — I679 Cerebrovascular disease, unspecified: Secondary | ICD-10-CM | POA: Diagnosis not present

## 2018-08-20 DIAGNOSIS — L570 Actinic keratosis: Secondary | ICD-10-CM | POA: Diagnosis not present

## 2018-08-22 DIAGNOSIS — I679 Cerebrovascular disease, unspecified: Secondary | ICD-10-CM | POA: Diagnosis not present

## 2018-08-22 DIAGNOSIS — L570 Actinic keratosis: Secondary | ICD-10-CM | POA: Diagnosis not present

## 2018-08-22 DIAGNOSIS — R69 Illness, unspecified: Secondary | ICD-10-CM | POA: Diagnosis not present

## 2018-08-22 DIAGNOSIS — K760 Fatty (change of) liver, not elsewhere classified: Secondary | ICD-10-CM | POA: Diagnosis not present

## 2018-08-22 DIAGNOSIS — I5031 Acute diastolic (congestive) heart failure: Secondary | ICD-10-CM | POA: Diagnosis not present

## 2018-08-22 DIAGNOSIS — I110 Hypertensive heart disease with heart failure: Secondary | ICD-10-CM | POA: Diagnosis not present

## 2018-08-22 DIAGNOSIS — M545 Low back pain: Secondary | ICD-10-CM | POA: Diagnosis not present

## 2018-08-22 DIAGNOSIS — Z86718 Personal history of other venous thrombosis and embolism: Secondary | ICD-10-CM | POA: Diagnosis not present

## 2018-08-22 DIAGNOSIS — M961 Postlaminectomy syndrome, not elsewhere classified: Secondary | ICD-10-CM | POA: Diagnosis not present

## 2018-08-22 DIAGNOSIS — E782 Mixed hyperlipidemia: Secondary | ICD-10-CM | POA: Diagnosis not present

## 2018-08-23 DIAGNOSIS — E782 Mixed hyperlipidemia: Secondary | ICD-10-CM | POA: Diagnosis not present

## 2018-08-23 DIAGNOSIS — I679 Cerebrovascular disease, unspecified: Secondary | ICD-10-CM | POA: Diagnosis not present

## 2018-08-23 DIAGNOSIS — L570 Actinic keratosis: Secondary | ICD-10-CM | POA: Diagnosis not present

## 2018-08-23 DIAGNOSIS — R69 Illness, unspecified: Secondary | ICD-10-CM | POA: Diagnosis not present

## 2018-08-23 DIAGNOSIS — I5031 Acute diastolic (congestive) heart failure: Secondary | ICD-10-CM | POA: Diagnosis not present

## 2018-08-23 DIAGNOSIS — M545 Low back pain: Secondary | ICD-10-CM | POA: Diagnosis not present

## 2018-08-23 DIAGNOSIS — Z86718 Personal history of other venous thrombosis and embolism: Secondary | ICD-10-CM | POA: Diagnosis not present

## 2018-08-23 DIAGNOSIS — M961 Postlaminectomy syndrome, not elsewhere classified: Secondary | ICD-10-CM | POA: Diagnosis not present

## 2018-08-23 DIAGNOSIS — I110 Hypertensive heart disease with heart failure: Secondary | ICD-10-CM | POA: Diagnosis not present

## 2018-08-23 DIAGNOSIS — K760 Fatty (change of) liver, not elsewhere classified: Secondary | ICD-10-CM | POA: Diagnosis not present

## 2018-08-27 DIAGNOSIS — M961 Postlaminectomy syndrome, not elsewhere classified: Secondary | ICD-10-CM | POA: Diagnosis not present

## 2018-08-27 DIAGNOSIS — R69 Illness, unspecified: Secondary | ICD-10-CM | POA: Diagnosis not present

## 2018-08-27 DIAGNOSIS — I5031 Acute diastolic (congestive) heart failure: Secondary | ICD-10-CM | POA: Diagnosis not present

## 2018-08-27 DIAGNOSIS — K760 Fatty (change of) liver, not elsewhere classified: Secondary | ICD-10-CM | POA: Diagnosis not present

## 2018-08-27 DIAGNOSIS — E782 Mixed hyperlipidemia: Secondary | ICD-10-CM | POA: Diagnosis not present

## 2018-08-27 DIAGNOSIS — M545 Low back pain: Secondary | ICD-10-CM | POA: Diagnosis not present

## 2018-08-27 DIAGNOSIS — I110 Hypertensive heart disease with heart failure: Secondary | ICD-10-CM | POA: Diagnosis not present

## 2018-08-27 DIAGNOSIS — Z86718 Personal history of other venous thrombosis and embolism: Secondary | ICD-10-CM | POA: Diagnosis not present

## 2018-08-27 DIAGNOSIS — L570 Actinic keratosis: Secondary | ICD-10-CM | POA: Diagnosis not present

## 2018-08-27 DIAGNOSIS — I679 Cerebrovascular disease, unspecified: Secondary | ICD-10-CM | POA: Diagnosis not present

## 2018-09-01 DIAGNOSIS — R69 Illness, unspecified: Secondary | ICD-10-CM | POA: Diagnosis not present

## 2018-09-01 DIAGNOSIS — I679 Cerebrovascular disease, unspecified: Secondary | ICD-10-CM | POA: Diagnosis not present

## 2018-09-01 DIAGNOSIS — M545 Low back pain: Secondary | ICD-10-CM | POA: Diagnosis not present

## 2018-09-01 DIAGNOSIS — K760 Fatty (change of) liver, not elsewhere classified: Secondary | ICD-10-CM | POA: Diagnosis not present

## 2018-09-01 DIAGNOSIS — I5031 Acute diastolic (congestive) heart failure: Secondary | ICD-10-CM | POA: Diagnosis not present

## 2018-09-01 DIAGNOSIS — M961 Postlaminectomy syndrome, not elsewhere classified: Secondary | ICD-10-CM | POA: Diagnosis not present

## 2018-09-01 DIAGNOSIS — Z86718 Personal history of other venous thrombosis and embolism: Secondary | ICD-10-CM | POA: Diagnosis not present

## 2018-09-01 DIAGNOSIS — I110 Hypertensive heart disease with heart failure: Secondary | ICD-10-CM | POA: Diagnosis not present

## 2018-09-01 DIAGNOSIS — E782 Mixed hyperlipidemia: Secondary | ICD-10-CM | POA: Diagnosis not present

## 2018-09-01 DIAGNOSIS — L570 Actinic keratosis: Secondary | ICD-10-CM | POA: Diagnosis not present

## 2018-09-03 DIAGNOSIS — M961 Postlaminectomy syndrome, not elsewhere classified: Secondary | ICD-10-CM | POA: Diagnosis not present

## 2018-09-03 DIAGNOSIS — M545 Low back pain: Secondary | ICD-10-CM | POA: Diagnosis not present

## 2018-09-03 DIAGNOSIS — R69 Illness, unspecified: Secondary | ICD-10-CM | POA: Diagnosis not present

## 2018-09-03 DIAGNOSIS — I110 Hypertensive heart disease with heart failure: Secondary | ICD-10-CM | POA: Diagnosis not present

## 2018-09-03 DIAGNOSIS — L570 Actinic keratosis: Secondary | ICD-10-CM | POA: Diagnosis not present

## 2018-09-03 DIAGNOSIS — I5031 Acute diastolic (congestive) heart failure: Secondary | ICD-10-CM | POA: Diagnosis not present

## 2018-09-03 DIAGNOSIS — E782 Mixed hyperlipidemia: Secondary | ICD-10-CM | POA: Diagnosis not present

## 2018-09-03 DIAGNOSIS — Z86718 Personal history of other venous thrombosis and embolism: Secondary | ICD-10-CM | POA: Diagnosis not present

## 2018-09-03 DIAGNOSIS — I679 Cerebrovascular disease, unspecified: Secondary | ICD-10-CM | POA: Diagnosis not present

## 2018-09-03 DIAGNOSIS — K760 Fatty (change of) liver, not elsewhere classified: Secondary | ICD-10-CM | POA: Diagnosis not present

## 2018-09-08 DIAGNOSIS — R69 Illness, unspecified: Secondary | ICD-10-CM | POA: Diagnosis not present

## 2018-09-08 DIAGNOSIS — I110 Hypertensive heart disease with heart failure: Secondary | ICD-10-CM | POA: Diagnosis not present

## 2018-09-08 DIAGNOSIS — Z86718 Personal history of other venous thrombosis and embolism: Secondary | ICD-10-CM | POA: Diagnosis not present

## 2018-09-08 DIAGNOSIS — I5031 Acute diastolic (congestive) heart failure: Secondary | ICD-10-CM | POA: Diagnosis not present

## 2018-09-08 DIAGNOSIS — M961 Postlaminectomy syndrome, not elsewhere classified: Secondary | ICD-10-CM | POA: Diagnosis not present

## 2018-09-08 DIAGNOSIS — L570 Actinic keratosis: Secondary | ICD-10-CM | POA: Diagnosis not present

## 2018-09-08 DIAGNOSIS — E782 Mixed hyperlipidemia: Secondary | ICD-10-CM | POA: Diagnosis not present

## 2018-09-08 DIAGNOSIS — I679 Cerebrovascular disease, unspecified: Secondary | ICD-10-CM | POA: Diagnosis not present

## 2018-09-08 DIAGNOSIS — K760 Fatty (change of) liver, not elsewhere classified: Secondary | ICD-10-CM | POA: Diagnosis not present

## 2018-09-08 DIAGNOSIS — M545 Low back pain: Secondary | ICD-10-CM | POA: Diagnosis not present

## 2018-10-05 ENCOUNTER — Telehealth: Payer: Self-pay | Admitting: Physician Assistant

## 2018-10-05 NOTE — Telephone Encounter (Signed)
°  4/7 :   Called patient. Patient did not answer. Left a VM to call me back regarding changing 4/13 appointment to another time.

## 2018-10-06 ENCOUNTER — Telehealth: Payer: Self-pay | Admitting: *Deleted

## 2018-10-06 NOTE — Telephone Encounter (Signed)
TELEPHONE CALL NOTE  Timothy Duncan has been deemed a candidate for a follow-up tele-health visit to limit community exposure during the Covid-19 pandemic. I spoke with the patient via phone to ensure availability of phone/video source, confirm preferred email & phone number, and discuss instructions and expectations.  I reminded Timothy Duncan to be prepared with any vital sign and/or heart rhythm information that could potentially be obtained via home monitoring, at the time of his visit. I reminded Timothy Duncan to expect a phone call at the time of his visit if his visit.  Did the patient verbally acknowledge consent to treatment?  YES   Timothy Duncan, [place] 10/06/2018 11:41 AM   DOWNLOADING THE [place]  - If Apple, go to [employer] and type in WebEx in the search bar. [place] [employer], the blue/green circle. The app is free but as with any other app downloads, their phone may require them to verify saved payment information or Apple password. The patient does NOT have to create an account.  - If Android, ask patient to go to [employer] and type in WebEx in the search bar. [place] [employer], the blue/green circle. The app is free but as with any other app downloads, their phone may require them to verify saved payment information or Android password. The patient does NOT have to create an account.   CONSENT FOR TELE-HEALTH VISIT - PLEASE REVIEW  I hereby voluntarily request, consent and authorize [facility] and its employed or contracted physicians, physician assistants, nurse practitioners or other licensed health care professionals (the Practitioner), to provide me with telemedicine health care services (the "Services") as deemed necessary by the treating Practitioner. I acknowledge and consent to receive the Services by the Practitioner via telemedicine. I understand that the telemedicine visit will involve communicating with the  Practitioner through live audiovisual communication technology and the disclosure of certain medical information by electronic transmission. I acknowledge that I have been given the opportunity to request an in-person assessment or other available alternative prior to the telemedicine visit and am voluntarily participating in the telemedicine visit.  I understand that I have the right to withhold or withdraw my consent to the use of telemedicine in the course of my care at any time, without affecting my right to future care or treatment, and that the Practitioner or I may terminate the telemedicine visit at any time. I understand that I have the right to inspect all information obtained and/or recorded in the course of the telemedicine visit and may receive copies of available information for a reasonable fee.  I understand that some of the potential risks of receiving the Services via telemedicine include:  Marland Kitchen Delay or interruption in medical evaluation due to technological equipment failure or disruption; . Information transmitted may not be sufficient (e.g. poor resolution of images) to allow for appropriate medical decision making by the Practitioner; and/or  . In rare instances, security protocols could fail, causing a breach of personal health information.  Furthermore, I acknowledge that it is my responsibility to provide information about my medical history, conditions and care that is complete and accurate to the best of my ability. I acknowledge that Practitioner's advice, recommendations, and/or decision may be based on factors not within their control, such as incomplete or inaccurate data provided by me or distortions of diagnostic images or specimens that may result from electronic transmissions. I understand that the practice of medicine  is not an Chief Strategy Officer and that Practitioner makes no warranties or guarantees regarding treatment outcomes. I acknowledge that I will receive a copy of this  consent concurrently upon execution via email to the email address I last provided but may also request a printed copy by calling the office of [place].    I understand that my insurance will be billed for this visit.   I have read or had this consent read to me. . I understand the contents of this consent, which adequately explains the benefits and risks of the Services being provided via telemedicine.  . I have been provided ample opportunity to ask questions regarding this consent and the Services and have had my questions answered to my satisfaction. . I give my informed consent for the services to be provided through the use of telemedicine in my medical care  By participating in this telemedicine visit I agree to the above.

## 2018-10-10 DIAGNOSIS — I5042 Chronic combined systolic (congestive) and diastolic (congestive) heart failure: Secondary | ICD-10-CM | POA: Insufficient documentation

## 2018-10-10 DIAGNOSIS — E785 Hyperlipidemia, unspecified: Secondary | ICD-10-CM | POA: Insufficient documentation

## 2018-10-10 NOTE — Progress Notes (Deleted)
{Choose 1 Note Type (Telehealth Visit or Telephone Visit):[phone]}   Evaluation Performed:  Follow-up visit  Date:  10/10/2018   ID:  Timothy Duncan, DOB 1940/10/16, MRN [id number]  {Patient Location:[phone]::"Home"}  {Provider Location:[phone]}  PCP:  Caren Macadam, MD  Cardiologist:  No primary care provider on file. *** Electrophysiologist:  None   Chief Complaint:  ***  History of Present Illness:    Timothy Duncan is a 78 y.o. male who presents via audio/video conferencing for a telehealth visit today.    He has chronic combined systolic and diastolic CHF, hypertension, hyperlipidemia, prior DVT, prior stroke, dementia.  He is no on anticoagulation due to frequent falls.  He was initially seen by Dr. Acie Fredrickson in 06/2018.  He had recently been admitted for a fall.  The patient presented to the office alone and did not have a clear understanding of his medical issues.  He was staying at a SNF at that time.  Dr. Acie Fredrickson felt he should be managed conservatively, at that point.    ***  The patient {does/does not:200015} have symptoms concerning for COVID-19 infection (fever, chills, cough, or new shortness of breath).    Past Medical History:  Diagnosis Date  . DVT (deep venous thrombosis) ([place]) 07/2015   RLE  . ED (erectile dysfunction)   . Hypertension   . Pulmonary embolism ([place])   . TIA (transient ischemic attack)    Past Surgical History:  Procedure Laterality Date  . APPENDECTOMY    . CATARACT EXTRACTION W/ INTRAOCULAR LENS  IMPLANT, BILATERAL Bilateral   . COLONOSCOPY    . LUMBAR LAMINECTOMY/DECOMPRESSION MICRODISCECTOMY Right 05/03/2015   Procedure: Microdiscectomy - Lumbar four-Lumbar five - right ;  Surgeon: Eustace Moore, MD;  Location: [place] [facility];  Service: Neurosurgery;  Laterality: Right;  . TONSILLECTOMY    . WOUND EXPLORATION N/A 06/06/2015   Procedure: Presumed discitis, Lumbar exploration,Repeat discectomy Lumbar four-five with deep tissue  culture;  Surgeon: Eustace Moore, MD;  Location: [place] [facility];  Service: Neurosurgery;  Laterality: N/A;     No outpatient medications have been marked as taking for the 10/11/18 encounter (Appointment) with Richardson Dopp T, PA-C.     Allergies:   Patient has no known allergies.   Social History   Tobacco Use  . Smoking status: Former Smoker    Packs/day: 3.00    Years: 30.00    Pack years: 90.00    Types: Cigarettes  . Smokeless tobacco: Former Systems developer    Types: Snuff, Chew  . Tobacco comment: "quit smoking in the 1980s; used chew and snuff off and on, none for awhile"  Substance Use Topics  . Alcohol use: No    Alcohol/week: 0.0 standard drinks    Comment: 03/31/2016 "no drinking since 06/22/2005"  . Drug use: No     Family Hx: The patient's family history includes Alcohol abuse in his maternal grandfather; Early death in his father.  ROS:   Please see the history of present illness.    *** All other systems reviewed and are negative.   Prior CV studies:   The following studies were reviewed today:  Echo 06/19/18 No mural apical thrombus with definity, mild LVH, EF 25-30, diff HK, Gr 1 DD, mild LAE  R LE Venous US 04/01/16 No acute DVT; +chronic R popliteal DVT  Echo 04/01/16 Severe LVH, EF 25-30, ant-sept DK, Gr 2 DD   Labs/Other Tests and Data Reviewed:    EKG:  {QAS:[id number]}  Recent Labs:  06/15/2018: TSH 2.165 06/19/2018: B Natriuretic Peptide 88.0 06/20/2018: ALT 19; Hemoglobin 14.9; Magnesium 2.5; Platelets 253 07/07/2018: BUN 12; Creatinine, Ser 1.11; Potassium 4.1; Sodium 141   Recent Lipid Panel Lab Results  Component Value Date/Time   CHOL 183 05/05/2018 12:37 PM   TRIG 153.0 (H) 05/05/2018 12:37 PM   TRIG 149 07/06/2006 09:26 AM   HDL 51.30 05/05/2018 12:37 PM   CHOLHDL 4 05/05/2018 12:37 PM   LDLCALC 101 (H) 05/05/2018 12:37 PM   LDLDIRECT 115.9 07/06/2006 09:26 AM    From [employer]     Wt Readings from Last 3 Encounters:  07/07/18 237  lb 1.9 oz (107.6 kg)  06/14/18 230 lb (104.3 kg)  05/05/18 250 lb 4.8 oz (113.5 kg)     Objective:    Vital Signs:  There were no vitals taken for this visit.   Well nourished, well developed male in no*** acute distress. ***  ASSESSMENT & PLAN:    1. ***  COVID-19 Education: The signs and symptoms of COVID-19 were discussed with the patient and how to seek care for testing (follow up with PCP or arrange E-visit).  ***The importance of social distancing was discussed today.  Time:   Today, I have spent *** minutes with the patient with telehealth technology discussing the above problems.     Medication Adjustments/Labs and Tests Ordered: Current medicines are reviewed at length with the patient today.  Concerns regarding medicines are outlined above.  Tests Ordered: No orders of the defined types were placed in this encounter.  Medication Changes: No orders of the defined types were placed in this encounter.   Disposition:  Follow up {follow up:15908}  Signed, Richardson Dopp, PA-C  10/10/2018 8:26 PM    [facility]

## 2018-10-11 ENCOUNTER — Telehealth: Payer: Self-pay | Admitting: *Deleted

## 2018-10-11 ENCOUNTER — Other Ambulatory Visit: Payer: Self-pay

## 2018-10-11 ENCOUNTER — Telehealth: Payer: Medicare HMO | Admitting: Physician Assistant

## 2018-10-11 DIAGNOSIS — Z86718 Personal history of other venous thrombosis and embolism: Secondary | ICD-10-CM | POA: Insufficient documentation

## 2018-10-11 NOTE — Telephone Encounter (Signed)
SPOKE WITH PT AND THEY ARE AGREEABLE TO CHANGING TIMES TO 8:45.  PT WILL HAVE AN CARE TAKER TO ASSIST WITH VITALS AND MEDICINES

## 2019-04-19 DIAGNOSIS — I6930 Unspecified sequelae of cerebral infarction: Secondary | ICD-10-CM | POA: Diagnosis not present

## 2019-04-19 DIAGNOSIS — I10 Essential (primary) hypertension: Secondary | ICD-10-CM | POA: Diagnosis not present

## 2019-04-19 DIAGNOSIS — M6281 Muscle weakness (generalized): Secondary | ICD-10-CM | POA: Diagnosis not present

## 2019-04-19 DIAGNOSIS — Z7189 Other specified counseling: Secondary | ICD-10-CM | POA: Diagnosis not present

## 2019-04-19 DIAGNOSIS — Z0000 Encounter for general adult medical examination without abnormal findings: Secondary | ICD-10-CM | POA: Diagnosis not present

## 2019-04-19 DIAGNOSIS — I5042 Chronic combined systolic (congestive) and diastolic (congestive) heart failure: Secondary | ICD-10-CM | POA: Diagnosis not present

## 2019-04-20 DIAGNOSIS — I5042 Chronic combined systolic (congestive) and diastolic (congestive) heart failure: Secondary | ICD-10-CM | POA: Diagnosis not present

## 2019-04-20 DIAGNOSIS — Z9089 Acquired absence of other organs: Secondary | ICD-10-CM | POA: Diagnosis not present

## 2019-04-20 DIAGNOSIS — Z8673 Personal history of transient ischemic attack (TIA), and cerebral infarction without residual deficits: Secondary | ICD-10-CM | POA: Diagnosis not present

## 2019-04-20 DIAGNOSIS — Z9181 History of falling: Secondary | ICD-10-CM | POA: Diagnosis not present

## 2019-04-20 DIAGNOSIS — Z7982 Long term (current) use of aspirin: Secondary | ICD-10-CM | POA: Diagnosis not present

## 2019-04-20 DIAGNOSIS — N529 Male erectile dysfunction, unspecified: Secondary | ICD-10-CM | POA: Diagnosis not present

## 2019-04-20 DIAGNOSIS — Z86718 Personal history of other venous thrombosis and embolism: Secondary | ICD-10-CM | POA: Diagnosis not present

## 2019-04-20 DIAGNOSIS — I110 Hypertensive heart disease with heart failure: Secondary | ICD-10-CM | POA: Diagnosis not present

## 2019-04-20 DIAGNOSIS — E785 Hyperlipidemia, unspecified: Secondary | ICD-10-CM | POA: Diagnosis not present

## 2019-05-02 DIAGNOSIS — I110 Hypertensive heart disease with heart failure: Secondary | ICD-10-CM | POA: Diagnosis not present

## 2019-05-02 DIAGNOSIS — Z9089 Acquired absence of other organs: Secondary | ICD-10-CM | POA: Diagnosis not present

## 2019-05-02 DIAGNOSIS — E785 Hyperlipidemia, unspecified: Secondary | ICD-10-CM | POA: Diagnosis not present

## 2019-05-02 DIAGNOSIS — Z86718 Personal history of other venous thrombosis and embolism: Secondary | ICD-10-CM | POA: Diagnosis not present

## 2019-05-02 DIAGNOSIS — Z7982 Long term (current) use of aspirin: Secondary | ICD-10-CM | POA: Diagnosis not present

## 2019-05-02 DIAGNOSIS — Z8673 Personal history of transient ischemic attack (TIA), and cerebral infarction without residual deficits: Secondary | ICD-10-CM | POA: Diagnosis not present

## 2019-05-02 DIAGNOSIS — Z9181 History of falling: Secondary | ICD-10-CM | POA: Diagnosis not present

## 2019-05-02 DIAGNOSIS — N529 Male erectile dysfunction, unspecified: Secondary | ICD-10-CM | POA: Diagnosis not present

## 2019-05-02 DIAGNOSIS — I5042 Chronic combined systolic (congestive) and diastolic (congestive) heart failure: Secondary | ICD-10-CM | POA: Diagnosis not present

## 2019-05-09 DIAGNOSIS — I110 Hypertensive heart disease with heart failure: Secondary | ICD-10-CM | POA: Diagnosis not present

## 2019-05-09 DIAGNOSIS — E785 Hyperlipidemia, unspecified: Secondary | ICD-10-CM | POA: Diagnosis not present

## 2019-05-09 DIAGNOSIS — Z9181 History of falling: Secondary | ICD-10-CM | POA: Diagnosis not present

## 2019-05-09 DIAGNOSIS — N529 Male erectile dysfunction, unspecified: Secondary | ICD-10-CM | POA: Diagnosis not present

## 2019-05-09 DIAGNOSIS — Z9089 Acquired absence of other organs: Secondary | ICD-10-CM | POA: Diagnosis not present

## 2019-05-09 DIAGNOSIS — Z86718 Personal history of other venous thrombosis and embolism: Secondary | ICD-10-CM | POA: Diagnosis not present

## 2019-05-09 DIAGNOSIS — I5042 Chronic combined systolic (congestive) and diastolic (congestive) heart failure: Secondary | ICD-10-CM | POA: Diagnosis not present

## 2019-05-09 DIAGNOSIS — Z8673 Personal history of transient ischemic attack (TIA), and cerebral infarction without residual deficits: Secondary | ICD-10-CM | POA: Diagnosis not present

## 2019-05-09 DIAGNOSIS — Z7982 Long term (current) use of aspirin: Secondary | ICD-10-CM | POA: Diagnosis not present

## 2019-05-11 DIAGNOSIS — E785 Hyperlipidemia, unspecified: Secondary | ICD-10-CM | POA: Diagnosis not present

## 2019-05-11 DIAGNOSIS — Z9181 History of falling: Secondary | ICD-10-CM | POA: Diagnosis not present

## 2019-05-11 DIAGNOSIS — Z86718 Personal history of other venous thrombosis and embolism: Secondary | ICD-10-CM | POA: Diagnosis not present

## 2019-05-11 DIAGNOSIS — N529 Male erectile dysfunction, unspecified: Secondary | ICD-10-CM | POA: Diagnosis not present

## 2019-05-11 DIAGNOSIS — I110 Hypertensive heart disease with heart failure: Secondary | ICD-10-CM | POA: Diagnosis not present

## 2019-05-11 DIAGNOSIS — Z8673 Personal history of transient ischemic attack (TIA), and cerebral infarction without residual deficits: Secondary | ICD-10-CM | POA: Diagnosis not present

## 2019-05-11 DIAGNOSIS — I5042 Chronic combined systolic (congestive) and diastolic (congestive) heart failure: Secondary | ICD-10-CM | POA: Diagnosis not present

## 2019-05-11 DIAGNOSIS — Z9089 Acquired absence of other organs: Secondary | ICD-10-CM | POA: Diagnosis not present

## 2019-05-11 DIAGNOSIS — Z7982 Long term (current) use of aspirin: Secondary | ICD-10-CM | POA: Diagnosis not present

## 2019-05-18 DIAGNOSIS — Z9181 History of falling: Secondary | ICD-10-CM | POA: Diagnosis not present

## 2019-05-18 DIAGNOSIS — N529 Male erectile dysfunction, unspecified: Secondary | ICD-10-CM | POA: Diagnosis not present

## 2019-05-18 DIAGNOSIS — E785 Hyperlipidemia, unspecified: Secondary | ICD-10-CM | POA: Diagnosis not present

## 2019-05-18 DIAGNOSIS — Z9089 Acquired absence of other organs: Secondary | ICD-10-CM | POA: Diagnosis not present

## 2019-05-18 DIAGNOSIS — Z8673 Personal history of transient ischemic attack (TIA), and cerebral infarction without residual deficits: Secondary | ICD-10-CM | POA: Diagnosis not present

## 2019-05-18 DIAGNOSIS — I5042 Chronic combined systolic (congestive) and diastolic (congestive) heart failure: Secondary | ICD-10-CM | POA: Diagnosis not present

## 2019-05-18 DIAGNOSIS — I110 Hypertensive heart disease with heart failure: Secondary | ICD-10-CM | POA: Diagnosis not present

## 2019-05-18 DIAGNOSIS — Z86718 Personal history of other venous thrombosis and embolism: Secondary | ICD-10-CM | POA: Diagnosis not present

## 2019-05-18 DIAGNOSIS — Z7982 Long term (current) use of aspirin: Secondary | ICD-10-CM | POA: Diagnosis not present

## 2019-06-02 ENCOUNTER — Telehealth: Payer: Self-pay | Admitting: Family Medicine

## 2019-06-02 DIAGNOSIS — R630 Anorexia: Secondary | ICD-10-CM | POA: Diagnosis not present

## 2019-06-02 DIAGNOSIS — R4182 Altered mental status, unspecified: Secondary | ICD-10-CM | POA: Diagnosis not present

## 2019-06-02 DIAGNOSIS — R262 Difficulty in walking, not elsewhere classified: Secondary | ICD-10-CM | POA: Diagnosis not present

## 2019-06-02 DIAGNOSIS — M6281 Muscle weakness (generalized): Secondary | ICD-10-CM | POA: Diagnosis not present

## 2019-06-02 DIAGNOSIS — J9811 Atelectasis: Secondary | ICD-10-CM | POA: Diagnosis not present

## 2019-06-02 DIAGNOSIS — Y998 Other external cause status: Secondary | ICD-10-CM | POA: Diagnosis not present

## 2019-06-02 DIAGNOSIS — I10 Essential (primary) hypertension: Secondary | ICD-10-CM | POA: Diagnosis not present

## 2019-06-02 DIAGNOSIS — B372 Candidiasis of skin and nail: Secondary | ICD-10-CM | POA: Diagnosis not present

## 2019-06-02 DIAGNOSIS — K7689 Other specified diseases of liver: Secondary | ICD-10-CM | POA: Diagnosis not present

## 2019-06-02 DIAGNOSIS — L308 Other specified dermatitis: Secondary | ICD-10-CM | POA: Diagnosis not present

## 2019-06-02 DIAGNOSIS — M545 Low back pain: Secondary | ICD-10-CM | POA: Diagnosis not present

## 2019-06-02 DIAGNOSIS — R627 Adult failure to thrive: Secondary | ICD-10-CM | POA: Diagnosis not present

## 2019-06-02 DIAGNOSIS — G4700 Insomnia, unspecified: Secondary | ICD-10-CM | POA: Diagnosis not present

## 2019-06-02 DIAGNOSIS — I447 Left bundle-branch block, unspecified: Secondary | ICD-10-CM | POA: Diagnosis not present

## 2019-06-02 DIAGNOSIS — W19XXXA Unspecified fall, initial encounter: Secondary | ICD-10-CM | POA: Diagnosis not present

## 2019-06-02 DIAGNOSIS — K449 Diaphragmatic hernia without obstruction or gangrene: Secondary | ICD-10-CM | POA: Diagnosis not present

## 2019-06-02 DIAGNOSIS — R69 Illness, unspecified: Secondary | ICD-10-CM | POA: Diagnosis not present

## 2019-06-02 DIAGNOSIS — R638 Other symptoms and signs concerning food and fluid intake: Secondary | ICD-10-CM | POA: Diagnosis not present

## 2019-06-02 DIAGNOSIS — Z7401 Bed confinement status: Secondary | ICD-10-CM | POA: Diagnosis not present

## 2019-06-02 DIAGNOSIS — I110 Hypertensive heart disease with heart failure: Secondary | ICD-10-CM | POA: Diagnosis not present

## 2019-06-02 DIAGNOSIS — K529 Noninfective gastroenteritis and colitis, unspecified: Secondary | ICD-10-CM | POA: Diagnosis not present

## 2019-06-02 DIAGNOSIS — K562 Volvulus: Secondary | ICD-10-CM | POA: Diagnosis not present

## 2019-06-02 DIAGNOSIS — R52 Pain, unspecified: Secondary | ICD-10-CM | POA: Diagnosis not present

## 2019-06-02 DIAGNOSIS — I5022 Chronic systolic (congestive) heart failure: Secondary | ICD-10-CM | POA: Diagnosis not present

## 2019-06-02 DIAGNOSIS — E119 Type 2 diabetes mellitus without complications: Secondary | ICD-10-CM | POA: Diagnosis not present

## 2019-06-02 DIAGNOSIS — W1830XA Fall on same level, unspecified, initial encounter: Secondary | ICD-10-CM | POA: Diagnosis not present

## 2019-06-02 DIAGNOSIS — K648 Other hemorrhoids: Secondary | ICD-10-CM | POA: Diagnosis not present

## 2019-06-02 DIAGNOSIS — R5381 Other malaise: Secondary | ICD-10-CM | POA: Diagnosis not present

## 2019-06-02 DIAGNOSIS — S199XXA Unspecified injury of neck, initial encounter: Secondary | ICD-10-CM | POA: Diagnosis not present

## 2019-06-02 DIAGNOSIS — K6389 Other specified diseases of intestine: Secondary | ICD-10-CM | POA: Diagnosis not present

## 2019-06-02 DIAGNOSIS — I493 Ventricular premature depolarization: Secondary | ICD-10-CM | POA: Diagnosis not present

## 2019-06-02 DIAGNOSIS — R935 Abnormal findings on diagnostic imaging of other abdominal regions, including retroperitoneum: Secondary | ICD-10-CM | POA: Diagnosis not present

## 2019-06-02 DIAGNOSIS — K5790 Diverticulosis of intestine, part unspecified, without perforation or abscess without bleeding: Secondary | ICD-10-CM | POA: Diagnosis not present

## 2019-06-02 DIAGNOSIS — W1839XA Other fall on same level, initial encounter: Secondary | ICD-10-CM | POA: Diagnosis not present

## 2019-06-02 DIAGNOSIS — D126 Benign neoplasm of colon, unspecified: Secondary | ICD-10-CM | POA: Diagnosis not present

## 2019-06-02 DIAGNOSIS — K5730 Diverticulosis of large intestine without perforation or abscess without bleeding: Secondary | ICD-10-CM | POA: Diagnosis not present

## 2019-06-02 DIAGNOSIS — S3992XA Unspecified injury of lower back, initial encounter: Secondary | ICD-10-CM | POA: Diagnosis not present

## 2019-06-02 DIAGNOSIS — M479 Spondylosis, unspecified: Secondary | ICD-10-CM | POA: Diagnosis not present

## 2019-06-02 DIAGNOSIS — K635 Polyp of colon: Secondary | ICD-10-CM | POA: Diagnosis not present

## 2019-06-02 DIAGNOSIS — R41841 Cognitive communication deficit: Secondary | ICD-10-CM | POA: Diagnosis not present

## 2019-06-02 DIAGNOSIS — J189 Pneumonia, unspecified organism: Secondary | ICD-10-CM | POA: Diagnosis not present

## 2019-06-02 DIAGNOSIS — M2550 Pain in unspecified joint: Secondary | ICD-10-CM | POA: Diagnosis not present

## 2019-06-02 DIAGNOSIS — R2689 Other abnormalities of gait and mobility: Secondary | ICD-10-CM | POA: Diagnosis not present

## 2019-06-02 DIAGNOSIS — R531 Weakness: Secondary | ICD-10-CM | POA: Diagnosis not present

## 2019-06-02 DIAGNOSIS — Z86718 Personal history of other venous thrombosis and embolism: Secondary | ICD-10-CM | POA: Diagnosis not present

## 2019-06-02 DIAGNOSIS — R1110 Vomiting, unspecified: Secondary | ICD-10-CM | POA: Diagnosis not present

## 2019-06-02 DIAGNOSIS — R296 Repeated falls: Secondary | ICD-10-CM | POA: Diagnosis not present

## 2019-06-02 DIAGNOSIS — S3993XA Unspecified injury of pelvis, initial encounter: Secondary | ICD-10-CM | POA: Diagnosis not present

## 2019-06-02 DIAGNOSIS — S299XXA Unspecified injury of thorax, initial encounter: Secondary | ICD-10-CM | POA: Diagnosis not present

## 2019-06-02 DIAGNOSIS — E785 Hyperlipidemia, unspecified: Secondary | ICD-10-CM | POA: Diagnosis not present

## 2019-06-02 DIAGNOSIS — K2980 Duodenitis without bleeding: Secondary | ICD-10-CM | POA: Diagnosis not present

## 2019-06-02 DIAGNOSIS — D72829 Elevated white blood cell count, unspecified: Secondary | ICD-10-CM | POA: Diagnosis not present

## 2019-06-02 DIAGNOSIS — N179 Acute kidney failure, unspecified: Secondary | ICD-10-CM | POA: Diagnosis not present

## 2019-06-02 DIAGNOSIS — R102 Pelvic and perineal pain: Secondary | ICD-10-CM | POA: Diagnosis not present

## 2019-06-02 DIAGNOSIS — K219 Gastro-esophageal reflux disease without esophagitis: Secondary | ICD-10-CM | POA: Diagnosis not present

## 2019-06-02 DIAGNOSIS — M549 Dorsalgia, unspecified: Secondary | ICD-10-CM | POA: Diagnosis not present

## 2019-06-02 NOTE — Telephone Encounter (Signed)
Please advise. Thank you

## 2019-06-02 NOTE — Telephone Encounter (Signed)
Copied from Mobile [phone]. Topic: General - Other >> Jun 02, 2019  3:24 PM Berneta Levins wrote: Reason for CRM:   Santiago Glad a Education officer, museum from the ED at [facility].  States that pt has been seen multiple times in the ED for falls and they need to make doctor aware.  Thinks that home health may need to get involved. Santiago Glad can be reached at [phone]

## 2019-06-03 DIAGNOSIS — R627 Adult failure to thrive: Secondary | ICD-10-CM | POA: Diagnosis not present

## 2019-06-03 DIAGNOSIS — R5381 Other malaise: Secondary | ICD-10-CM | POA: Diagnosis not present

## 2019-06-03 DIAGNOSIS — R296 Repeated falls: Secondary | ICD-10-CM | POA: Diagnosis not present

## 2019-06-03 DIAGNOSIS — D72829 Elevated white blood cell count, unspecified: Secondary | ICD-10-CM | POA: Diagnosis not present

## 2019-06-03 NOTE — Telephone Encounter (Signed)
He has not followed up here since his establish care visit back in November 2019.  He no showed for his last visit and has not rescheduled.  It would be helpful to get patient and family member in for an office visit.  I cannot order home health without seeing him for an actual face-to-face and feel that an office would be the best way to manage this.  At one point I thought he was going to some sort of assisted living facility based on ED notes?   Please see if you can get more details from Santiago Glad and if she has a family contact that we are able to reach out to as well.

## 2019-06-04 DIAGNOSIS — J189 Pneumonia, unspecified organism: Secondary | ICD-10-CM | POA: Diagnosis not present

## 2019-06-04 DIAGNOSIS — R5381 Other malaise: Secondary | ICD-10-CM | POA: Diagnosis not present

## 2019-06-04 DIAGNOSIS — D72829 Elevated white blood cell count, unspecified: Secondary | ICD-10-CM | POA: Diagnosis not present

## 2019-06-04 DIAGNOSIS — R638 Other symptoms and signs concerning food and fluid intake: Secondary | ICD-10-CM | POA: Diagnosis not present

## 2019-06-04 DIAGNOSIS — R627 Adult failure to thrive: Secondary | ICD-10-CM | POA: Diagnosis not present

## 2019-06-04 DIAGNOSIS — K7689 Other specified diseases of liver: Secondary | ICD-10-CM | POA: Diagnosis not present

## 2019-06-04 DIAGNOSIS — R296 Repeated falls: Secondary | ICD-10-CM | POA: Diagnosis not present

## 2019-06-05 DIAGNOSIS — D72829 Elevated white blood cell count, unspecified: Secondary | ICD-10-CM | POA: Diagnosis not present

## 2019-06-05 DIAGNOSIS — R935 Abnormal findings on diagnostic imaging of other abdominal regions, including retroperitoneum: Secondary | ICD-10-CM | POA: Diagnosis not present

## 2019-06-05 DIAGNOSIS — R638 Other symptoms and signs concerning food and fluid intake: Secondary | ICD-10-CM | POA: Diagnosis not present

## 2019-06-05 DIAGNOSIS — R296 Repeated falls: Secondary | ICD-10-CM | POA: Diagnosis not present

## 2019-06-05 DIAGNOSIS — R5381 Other malaise: Secondary | ICD-10-CM | POA: Diagnosis not present

## 2019-06-05 DIAGNOSIS — R531 Weakness: Secondary | ICD-10-CM | POA: Diagnosis not present

## 2019-06-05 DIAGNOSIS — R627 Adult failure to thrive: Secondary | ICD-10-CM | POA: Diagnosis not present

## 2019-06-05 DIAGNOSIS — J189 Pneumonia, unspecified organism: Secondary | ICD-10-CM | POA: Diagnosis not present

## 2019-06-05 DIAGNOSIS — K5730 Diverticulosis of large intestine without perforation or abscess without bleeding: Secondary | ICD-10-CM | POA: Diagnosis not present

## 2019-06-06 DIAGNOSIS — K6389 Other specified diseases of intestine: Secondary | ICD-10-CM | POA: Diagnosis not present

## 2019-06-06 NOTE — Telephone Encounter (Signed)
LMTCB for Karen 

## 2019-06-10 DIAGNOSIS — R451 Restlessness and agitation: Secondary | ICD-10-CM | POA: Diagnosis not present

## 2019-06-10 DIAGNOSIS — Z961 Presence of intraocular lens: Secondary | ICD-10-CM | POA: Diagnosis present

## 2019-06-10 DIAGNOSIS — Z8673 Personal history of transient ischemic attack (TIA), and cerebral infarction without residual deficits: Secondary | ICD-10-CM | POA: Diagnosis not present

## 2019-06-10 DIAGNOSIS — S37099A Other injury of unspecified kidney, initial encounter: Secondary | ICD-10-CM | POA: Diagnosis not present

## 2019-06-10 DIAGNOSIS — U071 COVID-19: Secondary | ICD-10-CM | POA: Diagnosis not present

## 2019-06-10 DIAGNOSIS — J9601 Acute respiratory failure with hypoxia: Secondary | ICD-10-CM | POA: Diagnosis not present

## 2019-06-10 DIAGNOSIS — Z7409 Other reduced mobility: Secondary | ICD-10-CM | POA: Diagnosis not present

## 2019-06-10 DIAGNOSIS — L309 Dermatitis, unspecified: Secondary | ICD-10-CM | POA: Diagnosis not present

## 2019-06-10 DIAGNOSIS — E785 Hyperlipidemia, unspecified: Secondary | ICD-10-CM | POA: Diagnosis present

## 2019-06-10 DIAGNOSIS — R1310 Dysphagia, unspecified: Secondary | ICD-10-CM | POA: Diagnosis present

## 2019-06-10 DIAGNOSIS — R0902 Hypoxemia: Secondary | ICD-10-CM | POA: Diagnosis not present

## 2019-06-10 DIAGNOSIS — N529 Male erectile dysfunction, unspecified: Secondary | ICD-10-CM | POA: Diagnosis present

## 2019-06-10 DIAGNOSIS — I82451 Acute embolism and thrombosis of right peroneal vein: Secondary | ICD-10-CM | POA: Diagnosis not present

## 2019-06-10 DIAGNOSIS — I110 Hypertensive heart disease with heart failure: Secondary | ICD-10-CM | POA: Diagnosis present

## 2019-06-10 DIAGNOSIS — R296 Repeated falls: Secondary | ICD-10-CM | POA: Diagnosis present

## 2019-06-10 DIAGNOSIS — R52 Pain, unspecified: Secondary | ICD-10-CM | POA: Diagnosis not present

## 2019-06-10 DIAGNOSIS — Z515 Encounter for palliative care: Secondary | ICD-10-CM | POA: Diagnosis not present

## 2019-06-10 DIAGNOSIS — R41841 Cognitive communication deficit: Secondary | ICD-10-CM | POA: Diagnosis not present

## 2019-06-10 DIAGNOSIS — Z9842 Cataract extraction status, left eye: Secondary | ICD-10-CM | POA: Diagnosis not present

## 2019-06-10 DIAGNOSIS — N179 Acute kidney failure, unspecified: Secondary | ICD-10-CM | POA: Diagnosis not present

## 2019-06-10 DIAGNOSIS — Z86711 Personal history of pulmonary embolism: Secondary | ICD-10-CM | POA: Diagnosis not present

## 2019-06-10 DIAGNOSIS — R4182 Altered mental status, unspecified: Secondary | ICD-10-CM | POA: Diagnosis not present

## 2019-06-10 DIAGNOSIS — N390 Urinary tract infection, site not specified: Secondary | ICD-10-CM | POA: Diagnosis not present

## 2019-06-10 DIAGNOSIS — I82412 Acute embolism and thrombosis of left femoral vein: Secondary | ICD-10-CM | POA: Diagnosis not present

## 2019-06-10 DIAGNOSIS — M549 Dorsalgia, unspecified: Secondary | ICD-10-CM | POA: Diagnosis present

## 2019-06-10 DIAGNOSIS — B372 Candidiasis of skin and nail: Secondary | ICD-10-CM | POA: Diagnosis not present

## 2019-06-10 DIAGNOSIS — Z7401 Bed confinement status: Secondary | ICD-10-CM | POA: Diagnosis not present

## 2019-06-10 DIAGNOSIS — B379 Candidiasis, unspecified: Secondary | ICD-10-CM | POA: Diagnosis not present

## 2019-06-10 DIAGNOSIS — I447 Left bundle-branch block, unspecified: Secondary | ICD-10-CM | POA: Diagnosis present

## 2019-06-10 DIAGNOSIS — F0390 Unspecified dementia without behavioral disturbance: Secondary | ICD-10-CM | POA: Diagnosis present

## 2019-06-10 DIAGNOSIS — J181 Lobar pneumonia, unspecified organism: Secondary | ICD-10-CM | POA: Diagnosis not present

## 2019-06-10 DIAGNOSIS — E872 Acidosis: Secondary | ICD-10-CM | POA: Diagnosis not present

## 2019-06-10 DIAGNOSIS — Z86718 Personal history of other venous thrombosis and embolism: Secondary | ICD-10-CM | POA: Diagnosis not present

## 2019-06-10 DIAGNOSIS — R5381 Other malaise: Secondary | ICD-10-CM | POA: Diagnosis not present

## 2019-06-10 DIAGNOSIS — M545 Low back pain: Secondary | ICD-10-CM | POA: Diagnosis not present

## 2019-06-10 DIAGNOSIS — I82432 Acute embolism and thrombosis of left popliteal vein: Secondary | ICD-10-CM | POA: Diagnosis not present

## 2019-06-10 DIAGNOSIS — J1282 Pneumonia due to coronavirus disease 2019: Secondary | ICD-10-CM | POA: Diagnosis not present

## 2019-06-10 DIAGNOSIS — R2689 Other abnormalities of gait and mobility: Secondary | ICD-10-CM | POA: Diagnosis not present

## 2019-06-10 DIAGNOSIS — I82442 Acute embolism and thrombosis of left tibial vein: Secondary | ICD-10-CM | POA: Diagnosis not present

## 2019-06-10 DIAGNOSIS — E119 Type 2 diabetes mellitus without complications: Secondary | ICD-10-CM | POA: Diagnosis not present

## 2019-06-10 DIAGNOSIS — R05 Cough: Secondary | ICD-10-CM | POA: Diagnosis not present

## 2019-06-10 DIAGNOSIS — R627 Adult failure to thrive: Secondary | ICD-10-CM | POA: Diagnosis not present

## 2019-06-10 DIAGNOSIS — K219 Gastro-esophageal reflux disease without esophagitis: Secondary | ICD-10-CM | POA: Diagnosis not present

## 2019-06-10 DIAGNOSIS — Z7189 Other specified counseling: Secondary | ICD-10-CM | POA: Diagnosis not present

## 2019-06-10 DIAGNOSIS — K562 Volvulus: Secondary | ICD-10-CM | POA: Diagnosis not present

## 2019-06-10 DIAGNOSIS — M6281 Muscle weakness (generalized): Secondary | ICD-10-CM | POA: Diagnosis not present

## 2019-06-10 DIAGNOSIS — R918 Other nonspecific abnormal finding of lung field: Secondary | ICD-10-CM | POA: Diagnosis not present

## 2019-06-10 DIAGNOSIS — M2550 Pain in unspecified joint: Secondary | ICD-10-CM | POA: Diagnosis not present

## 2019-06-10 DIAGNOSIS — I4510 Unspecified right bundle-branch block: Secondary | ICD-10-CM | POA: Diagnosis not present

## 2019-06-10 DIAGNOSIS — I10 Essential (primary) hypertension: Secondary | ICD-10-CM | POA: Diagnosis not present

## 2019-06-10 DIAGNOSIS — G4700 Insomnia, unspecified: Secondary | ICD-10-CM | POA: Diagnosis not present

## 2019-06-10 DIAGNOSIS — R7989 Other specified abnormal findings of blood chemistry: Secondary | ICD-10-CM | POA: Diagnosis not present

## 2019-06-10 DIAGNOSIS — I5042 Chronic combined systolic (congestive) and diastolic (congestive) heart failure: Secondary | ICD-10-CM | POA: Diagnosis not present

## 2019-06-10 DIAGNOSIS — K5900 Constipation, unspecified: Secondary | ICD-10-CM | POA: Diagnosis not present

## 2019-06-10 MED ORDER — EQUATE NICOTINE 4 MG MT GUM
4.00 | CHEWING_GUM | OROMUCOSAL | Status: DC
Start: ? — End: 2019-06-10

## 2019-06-10 MED ORDER — RUBBER BATH MAT MISC
Status: DC
Start: 2019-06-10 — End: 2019-06-10

## 2019-06-10 MED ORDER — QUINERVA 260 MG PO TABS
650.00 | ORAL_TABLET | ORAL | Status: DC
Start: ? — End: 2019-06-10

## 2019-06-10 MED ORDER — CVS EAR DROPS OT
40.00 | OTIC | Status: DC
Start: 2019-06-10 — End: 2019-06-10

## 2019-06-10 MED ORDER — RA PAIN RELIEVER PM PO
ORAL | Status: DC
Start: 2019-06-10 — End: 2019-06-10

## 2019-06-10 MED ORDER — STRI-DEX MAXIMUM STRENGTH 2 % EX PADS
125.00 | MEDICATED_PAD | CUTANEOUS | Status: DC
Start: ? — End: 2019-06-10

## 2019-06-10 MED ORDER — CHLOR-3 5.7-11.5-24.3 MEQ/2.5GM PO CRYS
1.00 | CRYSTALS | ORAL | Status: DC
Start: ? — End: 2019-06-10

## 2019-06-10 MED ORDER — ASTELIN 137 MCG/SPRAY NA SOLN
40.00 | NASAL | Status: DC
Start: 2019-06-11 — End: 2019-06-10

## 2019-06-10 MED ORDER — OLANZAPINE-FLUOXETINE HCL 6-50 MG PO CAPS
6.00 | ORAL_CAPSULE | ORAL | Status: DC
Start: ? — End: 2019-06-10

## 2019-06-10 MED ORDER — DAYHIST-1 PO
30.00 | ORAL | Status: DC
Start: 2019-06-10 — End: 2019-06-10

## 2019-06-10 MED ORDER — PANCOF HC 15-2-3 MG/5ML OR SYRP
1.00 | ORAL_SOLUTION | ORAL | Status: DC
Start: ? — End: 2019-06-10

## 2019-06-10 MED ORDER — FLEXITAINER ENTERAL CONTAINER MISC
1.00 | Status: DC
Start: 2019-06-10 — End: 2019-06-10

## 2019-06-10 MED ORDER — SURE COMFORT INSULIN SYRINGE 30G X 1/2" 0.5 ML MISC
25.00 | Status: DC
Start: 2019-06-11 — End: 2019-06-10

## 2019-06-10 MED ORDER — INTROL PO
2.00 | ORAL | Status: DC
Start: ? — End: 2019-06-10

## 2019-06-10 MED ORDER — ISOVUE-M 300 61 % IJ SOLN
4.00 | INTRAMUSCULAR | Status: DC
Start: ? — End: 2019-06-10

## 2019-06-10 MED ORDER — HM VITAMIN C 1000 MG PO TABS
12.50 | ORAL_TABLET | ORAL | Status: DC
Start: 2019-06-11 — End: 2019-06-10

## 2019-06-10 MED ORDER — NEXTERONE IV
81.00 | INTRAVENOUS | Status: DC
Start: 2019-06-11 — End: 2019-06-10

## 2019-06-10 MED ORDER — FOSPHENYTOIN SODIUM 50 MG PE/ML IJ SOLN
15.00 | INTRAMUSCULAR | Status: DC
Start: ? — End: 2019-06-10

## 2019-06-13 DIAGNOSIS — S37099A Other injury of unspecified kidney, initial encounter: Secondary | ICD-10-CM | POA: Diagnosis not present

## 2019-06-13 DIAGNOSIS — Z7409 Other reduced mobility: Secondary | ICD-10-CM | POA: Diagnosis not present

## 2019-06-13 DIAGNOSIS — E119 Type 2 diabetes mellitus without complications: Secondary | ICD-10-CM | POA: Diagnosis not present

## 2019-06-13 DIAGNOSIS — I10 Essential (primary) hypertension: Secondary | ICD-10-CM | POA: Diagnosis not present

## 2019-06-13 DIAGNOSIS — M545 Low back pain: Secondary | ICD-10-CM | POA: Diagnosis not present

## 2019-06-13 DIAGNOSIS — B379 Candidiasis, unspecified: Secondary | ICD-10-CM | POA: Diagnosis not present

## 2019-06-13 DIAGNOSIS — K219 Gastro-esophageal reflux disease without esophagitis: Secondary | ICD-10-CM | POA: Diagnosis not present

## 2019-06-13 DIAGNOSIS — G4700 Insomnia, unspecified: Secondary | ICD-10-CM | POA: Diagnosis not present

## 2019-06-13 DIAGNOSIS — R627 Adult failure to thrive: Secondary | ICD-10-CM | POA: Diagnosis not present

## 2019-06-13 NOTE — Telephone Encounter (Signed)
Routing to [place] for follow up.

## 2019-06-13 NOTE — Telephone Encounter (Signed)
Left a message for Santiago Glad to return my call with further info as requested by Dr Ethlyn Gallery.  CRM also created.

## 2019-06-14 DIAGNOSIS — L309 Dermatitis, unspecified: Secondary | ICD-10-CM | POA: Diagnosis not present

## 2019-06-14 DIAGNOSIS — M545 Low back pain: Secondary | ICD-10-CM | POA: Diagnosis not present

## 2019-06-14 DIAGNOSIS — M6281 Muscle weakness (generalized): Secondary | ICD-10-CM | POA: Diagnosis not present

## 2019-06-14 DIAGNOSIS — I10 Essential (primary) hypertension: Secondary | ICD-10-CM | POA: Diagnosis not present

## 2019-06-14 DIAGNOSIS — K5900 Constipation, unspecified: Secondary | ICD-10-CM | POA: Diagnosis not present

## 2019-06-14 DIAGNOSIS — Z7409 Other reduced mobility: Secondary | ICD-10-CM | POA: Diagnosis not present

## 2019-06-14 DIAGNOSIS — K219 Gastro-esophageal reflux disease without esophagitis: Secondary | ICD-10-CM | POA: Diagnosis not present

## 2019-06-15 DIAGNOSIS — G4700 Insomnia, unspecified: Secondary | ICD-10-CM | POA: Diagnosis not present

## 2019-06-15 DIAGNOSIS — E119 Type 2 diabetes mellitus without complications: Secondary | ICD-10-CM | POA: Diagnosis not present

## 2019-06-15 DIAGNOSIS — K219 Gastro-esophageal reflux disease without esophagitis: Secondary | ICD-10-CM | POA: Diagnosis not present

## 2019-06-15 DIAGNOSIS — M545 Low back pain: Secondary | ICD-10-CM | POA: Diagnosis not present

## 2019-06-15 DIAGNOSIS — I10 Essential (primary) hypertension: Secondary | ICD-10-CM | POA: Diagnosis not present

## 2019-06-15 DIAGNOSIS — R627 Adult failure to thrive: Secondary | ICD-10-CM | POA: Diagnosis not present

## 2019-06-15 DIAGNOSIS — B379 Candidiasis, unspecified: Secondary | ICD-10-CM | POA: Diagnosis not present

## 2019-06-15 DIAGNOSIS — S37099A Other injury of unspecified kidney, initial encounter: Secondary | ICD-10-CM | POA: Diagnosis not present

## 2019-06-15 DIAGNOSIS — Z7409 Other reduced mobility: Secondary | ICD-10-CM | POA: Diagnosis not present

## 2019-06-17 DIAGNOSIS — R4182 Altered mental status, unspecified: Secondary | ICD-10-CM | POA: Diagnosis not present

## 2019-06-17 DIAGNOSIS — E119 Type 2 diabetes mellitus without complications: Secondary | ICD-10-CM | POA: Diagnosis not present

## 2019-06-17 DIAGNOSIS — Z7409 Other reduced mobility: Secondary | ICD-10-CM | POA: Diagnosis not present

## 2019-06-17 DIAGNOSIS — I10 Essential (primary) hypertension: Secondary | ICD-10-CM | POA: Diagnosis not present

## 2019-06-17 DIAGNOSIS — K219 Gastro-esophageal reflux disease without esophagitis: Secondary | ICD-10-CM | POA: Diagnosis not present

## 2019-06-17 DIAGNOSIS — G4700 Insomnia, unspecified: Secondary | ICD-10-CM | POA: Diagnosis not present

## 2019-06-17 DIAGNOSIS — R627 Adult failure to thrive: Secondary | ICD-10-CM | POA: Diagnosis not present

## 2019-06-17 DIAGNOSIS — B379 Candidiasis, unspecified: Secondary | ICD-10-CM | POA: Diagnosis not present

## 2019-06-17 DIAGNOSIS — S37099A Other injury of unspecified kidney, initial encounter: Secondary | ICD-10-CM | POA: Diagnosis not present

## 2019-06-17 DIAGNOSIS — M545 Low back pain: Secondary | ICD-10-CM | POA: Diagnosis not present

## 2019-06-20 DIAGNOSIS — M545 Low back pain: Secondary | ICD-10-CM | POA: Diagnosis not present

## 2019-06-20 DIAGNOSIS — K5900 Constipation, unspecified: Secondary | ICD-10-CM | POA: Diagnosis not present

## 2019-06-20 DIAGNOSIS — K219 Gastro-esophageal reflux disease without esophagitis: Secondary | ICD-10-CM | POA: Diagnosis not present

## 2019-06-20 DIAGNOSIS — Z7409 Other reduced mobility: Secondary | ICD-10-CM | POA: Diagnosis not present

## 2019-06-20 DIAGNOSIS — M6281 Muscle weakness (generalized): Secondary | ICD-10-CM | POA: Diagnosis not present

## 2019-06-20 DIAGNOSIS — I10 Essential (primary) hypertension: Secondary | ICD-10-CM | POA: Diagnosis not present

## 2019-06-20 DIAGNOSIS — N390 Urinary tract infection, site not specified: Secondary | ICD-10-CM | POA: Diagnosis not present

## 2019-06-20 DIAGNOSIS — S37099A Other injury of unspecified kidney, initial encounter: Secondary | ICD-10-CM | POA: Diagnosis not present

## 2019-06-20 DIAGNOSIS — L309 Dermatitis, unspecified: Secondary | ICD-10-CM | POA: Diagnosis not present

## 2019-06-20 DIAGNOSIS — R05 Cough: Secondary | ICD-10-CM | POA: Diagnosis not present

## 2019-06-27 DIAGNOSIS — K219 Gastro-esophageal reflux disease without esophagitis: Secondary | ICD-10-CM | POA: Diagnosis not present

## 2019-06-27 DIAGNOSIS — M545 Low back pain: Secondary | ICD-10-CM | POA: Diagnosis not present

## 2019-06-27 DIAGNOSIS — L309 Dermatitis, unspecified: Secondary | ICD-10-CM | POA: Diagnosis not present

## 2019-06-27 DIAGNOSIS — S37099A Other injury of unspecified kidney, initial encounter: Secondary | ICD-10-CM | POA: Diagnosis not present

## 2019-06-27 DIAGNOSIS — K5900 Constipation, unspecified: Secondary | ICD-10-CM | POA: Diagnosis not present

## 2019-06-27 DIAGNOSIS — M6281 Muscle weakness (generalized): Secondary | ICD-10-CM | POA: Diagnosis not present

## 2019-06-27 DIAGNOSIS — N390 Urinary tract infection, site not specified: Secondary | ICD-10-CM | POA: Diagnosis not present

## 2019-06-27 DIAGNOSIS — I10 Essential (primary) hypertension: Secondary | ICD-10-CM | POA: Diagnosis not present

## 2019-06-27 DIAGNOSIS — Z7409 Other reduced mobility: Secondary | ICD-10-CM | POA: Diagnosis not present

## 2019-06-27 DIAGNOSIS — R05 Cough: Secondary | ICD-10-CM | POA: Diagnosis not present

## 2019-06-29 DIAGNOSIS — U071 COVID-19: Secondary | ICD-10-CM | POA: Diagnosis not present

## 2019-07-02 ENCOUNTER — Emergency Department (HOSPITAL_COMMUNITY): Payer: Medicare HMO

## 2019-07-02 ENCOUNTER — Encounter (HOSPITAL_COMMUNITY): Payer: Self-pay

## 2019-07-02 ENCOUNTER — Inpatient Hospital Stay (HOSPITAL_COMMUNITY)
Admission: EM | Admit: 2019-07-02 | Discharge: 2019-07-11 | DRG: 177 | Disposition: A | Discharge status: Skilled Nursing Facility | Payer: Medicare HMO | Attending: Emergency Medicine | Admitting: Emergency Medicine

## 2019-07-02 ENCOUNTER — Other Ambulatory Visit: Payer: Self-pay

## 2019-07-02 DIAGNOSIS — Z8673 Personal history of transient ischemic attack (TIA), and cerebral infarction without residual deficits: Secondary | ICD-10-CM | POA: Diagnosis not present

## 2019-07-02 DIAGNOSIS — R296 Repeated falls: Secondary | ICD-10-CM | POA: Diagnosis present

## 2019-07-02 DIAGNOSIS — I82442 Acute embolism and thrombosis of left tibial vein: Secondary | ICD-10-CM | POA: Diagnosis present

## 2019-07-02 DIAGNOSIS — Z86718 Personal history of other venous thrombosis and embolism: Secondary | ICD-10-CM

## 2019-07-02 DIAGNOSIS — R52 Pain, unspecified: Secondary | ICD-10-CM | POA: Diagnosis not present

## 2019-07-02 DIAGNOSIS — R451 Restlessness and agitation: Secondary | ICD-10-CM | POA: Diagnosis not present

## 2019-07-02 DIAGNOSIS — N529 Male erectile dysfunction, unspecified: Secondary | ICD-10-CM | POA: Diagnosis present

## 2019-07-02 DIAGNOSIS — I82412 Acute embolism and thrombosis of left femoral vein: Secondary | ICD-10-CM | POA: Diagnosis present

## 2019-07-02 DIAGNOSIS — Z9841 Cataract extraction status, right eye: Secondary | ICD-10-CM

## 2019-07-02 DIAGNOSIS — U071 COVID-19: Secondary | ICD-10-CM | POA: Diagnosis present

## 2019-07-02 DIAGNOSIS — F0390 Unspecified dementia without behavioral disturbance: Secondary | ICD-10-CM | POA: Diagnosis present

## 2019-07-02 DIAGNOSIS — R791 Abnormal coagulation profile: Secondary | ICD-10-CM | POA: Diagnosis not present

## 2019-07-02 DIAGNOSIS — Z9842 Cataract extraction status, left eye: Secondary | ICD-10-CM | POA: Diagnosis not present

## 2019-07-02 DIAGNOSIS — R0902 Hypoxemia: Secondary | ICD-10-CM | POA: Diagnosis not present

## 2019-07-02 DIAGNOSIS — I6990 Unspecified sequelae of unspecified cerebrovascular disease: Secondary | ICD-10-CM | POA: Diagnosis not present

## 2019-07-02 DIAGNOSIS — G20 Parkinson's disease: Secondary | ICD-10-CM | POA: Diagnosis not present

## 2019-07-02 DIAGNOSIS — M6281 Muscle weakness (generalized): Secondary | ICD-10-CM | POA: Diagnosis not present

## 2019-07-02 DIAGNOSIS — Z515 Encounter for palliative care: Secondary | ICD-10-CM | POA: Diagnosis not present

## 2019-07-02 DIAGNOSIS — I82432 Acute embolism and thrombosis of left popliteal vein: Secondary | ICD-10-CM | POA: Diagnosis not present

## 2019-07-02 DIAGNOSIS — R69 Illness, unspecified: Secondary | ICD-10-CM | POA: Diagnosis not present

## 2019-07-02 DIAGNOSIS — I447 Left bundle-branch block, unspecified: Secondary | ICD-10-CM | POA: Diagnosis present

## 2019-07-02 DIAGNOSIS — R1310 Dysphagia, unspecified: Secondary | ICD-10-CM | POA: Diagnosis present

## 2019-07-02 DIAGNOSIS — E872 Acidosis, unspecified: Secondary | ICD-10-CM | POA: Diagnosis present

## 2019-07-02 DIAGNOSIS — R7989 Other specified abnormal findings of blood chemistry: Secondary | ICD-10-CM | POA: Diagnosis not present

## 2019-07-02 DIAGNOSIS — Z7189 Other specified counseling: Secondary | ICD-10-CM | POA: Diagnosis not present

## 2019-07-02 DIAGNOSIS — M24561 Contracture, right knee: Secondary | ICD-10-CM | POA: Diagnosis not present

## 2019-07-02 DIAGNOSIS — I82451 Acute embolism and thrombosis of right peroneal vein: Secondary | ICD-10-CM | POA: Diagnosis present

## 2019-07-02 DIAGNOSIS — M549 Dorsalgia, unspecified: Secondary | ICD-10-CM | POA: Diagnosis present

## 2019-07-02 DIAGNOSIS — I110 Hypertensive heart disease with heart failure: Secondary | ICD-10-CM | POA: Diagnosis present

## 2019-07-02 DIAGNOSIS — R402411 Glasgow coma scale score 13-15, in the field [EMT or ambulance]: Secondary | ICD-10-CM | POA: Diagnosis not present

## 2019-07-02 DIAGNOSIS — R262 Difficulty in walking, not elsewhere classified: Secondary | ICD-10-CM | POA: Diagnosis not present

## 2019-07-02 DIAGNOSIS — I10 Essential (primary) hypertension: Secondary | ICD-10-CM | POA: Diagnosis present

## 2019-07-02 DIAGNOSIS — J189 Pneumonia, unspecified organism: Secondary | ICD-10-CM | POA: Diagnosis not present

## 2019-07-02 DIAGNOSIS — I4510 Unspecified right bundle-branch block: Secondary | ICD-10-CM | POA: Diagnosis not present

## 2019-07-02 DIAGNOSIS — Z86711 Personal history of pulmonary embolism: Secondary | ICD-10-CM

## 2019-07-02 DIAGNOSIS — J9601 Acute respiratory failure with hypoxia: Secondary | ICD-10-CM | POA: Diagnosis not present

## 2019-07-02 DIAGNOSIS — Z961 Presence of intraocular lens: Secondary | ICD-10-CM | POA: Diagnosis present

## 2019-07-02 DIAGNOSIS — J1282 Pneumonia due to coronavirus disease 2019: Secondary | ICD-10-CM | POA: Diagnosis present

## 2019-07-02 DIAGNOSIS — Z87891 Personal history of nicotine dependence: Secondary | ICD-10-CM

## 2019-07-02 DIAGNOSIS — I5042 Chronic combined systolic (congestive) and diastolic (congestive) heart failure: Secondary | ICD-10-CM | POA: Diagnosis not present

## 2019-07-02 DIAGNOSIS — Z7401 Bed confinement status: Secondary | ICD-10-CM | POA: Diagnosis not present

## 2019-07-02 DIAGNOSIS — E785 Hyperlipidemia, unspecified: Secondary | ICD-10-CM | POA: Diagnosis present

## 2019-07-02 DIAGNOSIS — R1312 Dysphagia, oropharyngeal phase: Secondary | ICD-10-CM | POA: Diagnosis not present

## 2019-07-02 DIAGNOSIS — J181 Lobar pneumonia, unspecified organism: Secondary | ICD-10-CM | POA: Diagnosis not present

## 2019-07-02 DIAGNOSIS — M2550 Pain in unspecified joint: Secondary | ICD-10-CM | POA: Diagnosis not present

## 2019-07-02 LAB — COMPREHENSIVE METABOLIC PANEL
ALT: 48 U/L — ABNORMAL HIGH (ref 0–44)
AST: 50 U/L — ABNORMAL HIGH (ref 15–41)
Albumin: 3.3 g/dL — ABNORMAL LOW (ref 3.5–5.0)
Alkaline Phosphatase: 58 U/L (ref 38–126)
Anion gap: 13 (ref 5–15)
BUN: 17 mg/dL (ref 8–23)
CO2: 25 mmol/L (ref 22–32)
Calcium: 9.1 mg/dL (ref 8.9–10.3)
Chloride: 107 mmol/L (ref 98–111)
Creatinine, Ser: 1.12 mg/dL (ref 0.61–1.24)
GFR calc Af Amer: >60 mL/min (ref >60)
GFR calc non Af Amer: >60 mL/min (ref >60)
Glucose, Bld: 102 mg/dL — ABNORMAL HIGH (ref 70–99)
Potassium: 4 mmol/L (ref 3.5–5.1)
Sodium: 145 mmol/L (ref 135–145)
Total Bilirubin: 1.3 mg/dL — ABNORMAL HIGH (ref 0.3–1.2)
Total Protein: 7.9 g/dL (ref 6.5–8.1)

## 2019-07-02 LAB — LACTIC ACID, PLASMA
Lactic Acid, Venous: 1.3 mmol/L (ref 0.5–1.9)
Lactic Acid, Venous: 3.1 mmol/L (ref 0.5–1.9)

## 2019-07-02 LAB — FERRITIN: Ferritin: 1009 ng/mL — ABNORMAL HIGH (ref 24–336)

## 2019-07-02 LAB — CBC WITH DIFFERENTIAL/PLATELET
Abs Immature Granulocytes: 0.13 10*3/uL — ABNORMAL HIGH (ref 0.00–0.07)
Basophils Absolute: 0.1 10*3/uL (ref 0.0–0.1)
Basophils Relative: 1 %
Eosinophils Absolute: 0.3 10*3/uL (ref 0.0–0.5)
Eosinophils Relative: 3 %
HCT: 40.1 % (ref 39.0–52.0)
Hemoglobin: 12.6 g/dL — ABNORMAL LOW (ref 13.0–17.0)
Immature Granulocytes: 1 %
Lymphocytes Relative: 21 %
Lymphs Abs: 1.9 10*3/uL (ref 0.7–4.0)
MCH: 30.5 pg (ref 26.0–34.0)
MCHC: 31.4 g/dL (ref 30.0–36.0)
MCV: 97.1 fL (ref 80.0–100.0)
Monocytes Absolute: 1.1 10*3/uL — ABNORMAL HIGH (ref 0.1–1.0)
Monocytes Relative: 12 %
Neutro Abs: 5.6 10*3/uL (ref 1.7–7.7)
Neutrophils Relative %: 62 %
Platelets: 422 10*3/uL — ABNORMAL HIGH (ref 150–400)
RBC: 4.13 MIL/uL — ABNORMAL LOW (ref 4.22–5.81)
RDW: 13.5 % (ref 11.5–15.5)
WBC: 9 10*3/uL (ref 4.0–10.5)
nRBC: 0 % (ref 0.0–0.2)

## 2019-07-02 LAB — CBC
HCT: 41.5 % (ref 39.0–52.0)
Hemoglobin: 13.2 g/dL (ref 13.0–17.0)
MCH: 30.8 pg (ref 26.0–34.0)
MCHC: 31.8 g/dL (ref 30.0–36.0)
MCV: 96.7 fL (ref 80.0–100.0)
Platelets: 405 10*3/uL — ABNORMAL HIGH (ref 150–400)
RBC: 4.29 MIL/uL (ref 4.22–5.81)
RDW: 13.5 % (ref 11.5–15.5)
WBC: 9.2 10*3/uL (ref 4.0–10.5)
nRBC: 0 % (ref 0.0–0.2)

## 2019-07-02 LAB — TSH: TSH: 2.278 u[IU]/mL (ref 0.350–4.500)

## 2019-07-02 LAB — CREATININE, SERUM
Creatinine, Ser: 0.94 mg/dL (ref 0.61–1.24)
GFR calc Af Amer: >60 mL/min (ref >60)
GFR calc non Af Amer: >60 mL/min (ref >60)

## 2019-07-02 LAB — PROCALCITONIN: Procalcitonin: <0.1 ng/mL

## 2019-07-02 LAB — LACTATE DEHYDROGENASE: LDH: 200 U/L — ABNORMAL HIGH (ref 98–192)

## 2019-07-02 LAB — D-DIMER, QUANTITATIVE: D-Dimer, Quant: 11.55 ug/mL-FEU — ABNORMAL HIGH (ref 0.00–0.50)

## 2019-07-02 LAB — FIBRINOGEN: Fibrinogen: >800 mg/dL — ABNORMAL HIGH (ref 210–475)

## 2019-07-02 LAB — TRIGLYCERIDES: Triglycerides: 89 mg/dL (ref <150)

## 2019-07-02 LAB — ABO/RH: ABO/RH(D): O POS

## 2019-07-02 LAB — BRAIN NATRIURETIC PEPTIDE: B Natriuretic Peptide: 92.2 pg/mL (ref 0.0–100.0)

## 2019-07-02 LAB — C-REACTIVE PROTEIN: CRP: 8.7 mg/dL — ABNORMAL HIGH (ref <1.0)

## 2019-07-02 MED ORDER — MELATONIN 3 MG PO TABS
6.0000 mg | ORAL_TABLET | Freq: Every day | ORAL | Status: DC
  Administered 2019-07-02 – 2019-07-10 (×9): 6 mg via ORAL
  Filled 2019-07-02 (×9): qty 2

## 2019-07-02 MED ORDER — SENNOSIDES-DOCUSATE SODIUM 8.6-50 MG PO TABS
1.0000 | ORAL_TABLET | Freq: Every evening | ORAL | Status: DC | PRN
  Administered 2019-07-10: 17:00:00 1 via ORAL
  Filled 2019-07-02: qty 1

## 2019-07-02 MED ORDER — SODIUM CHLORIDE 0.9 % IV SOLN
100.0000 mg | Freq: Every day | INTRAVENOUS | Status: AC
  Administered 2019-07-03 – 2019-07-06 (×4): 100 mg via INTRAVENOUS
  Filled 2019-07-02 (×4): qty 100

## 2019-07-02 MED ORDER — IOHEXOL 350 MG/ML SOLN
80.0000 mL | Freq: Once | INTRAVENOUS | Status: AC | PRN
  Administered 2019-07-02: 80 mL via INTRAVENOUS

## 2019-07-02 MED ORDER — GUAIFENESIN-DM 100-10 MG/5ML PO SYRP: 10.0000 mL | ORAL_SOLUTION | ORAL | Status: DC | PRN

## 2019-07-02 MED ORDER — ACETAMINOPHEN 325 MG PO TABS: 650.0000 mg | ORAL_TABLET | Freq: Four times a day (QID) | ORAL | Status: DC | PRN

## 2019-07-02 MED ORDER — SODIUM CHLORIDE 0.9 % IV SOLN: 100.0000 mg | Freq: Every day | INTRAVENOUS | Status: DC

## 2019-07-02 MED ORDER — OXYCODONE HCL 5 MG PO TABS: 5.0000 mg | ORAL_TABLET | Freq: Four times a day (QID) | ORAL | Status: DC | PRN

## 2019-07-02 MED ORDER — ONDANSETRON HCL 4 MG PO TABS: 4.0000 mg | ORAL_TABLET | Freq: Four times a day (QID) | ORAL | Status: DC | PRN

## 2019-07-02 MED ORDER — SODIUM CHLORIDE 0.9 % IV BOLUS
500.0000 mL | Freq: Once | INTRAVENOUS | Status: AC
  Administered 2019-07-02: 500 mL via INTRAVENOUS

## 2019-07-02 MED ORDER — SODIUM CHLORIDE (PF) 0.9 % IJ SOLN
INTRAMUSCULAR | Status: AC
  Filled 2019-07-02: qty 50

## 2019-07-02 MED ORDER — LOSARTAN POTASSIUM 25 MG PO TABS
25.0000 mg | ORAL_TABLET | Freq: Every day | ORAL | Status: DC
  Administered 2019-07-02 – 2019-07-11 (×10): 25 mg via ORAL
  Filled 2019-07-02 (×10): qty 1

## 2019-07-02 MED ORDER — ENOXAPARIN SODIUM 40 MG/0.4ML ~~LOC~~ SOLN
40.0000 mg | Freq: Two times a day (BID) | SUBCUTANEOUS | Status: DC
  Administered 2019-07-02: 40 mg via SUBCUTANEOUS
  Filled 2019-07-02: qty 0.4

## 2019-07-02 MED ORDER — DEXAMETHASONE SODIUM PHOSPHATE 10 MG/ML IJ SOLN
6.0000 mg | Freq: Once | INTRAMUSCULAR | Status: AC
  Administered 2019-07-02: 6 mg via INTRAVENOUS
  Filled 2019-07-02: qty 1

## 2019-07-02 MED ORDER — BISACODYL 5 MG PO TBEC: 5.0000 mg | DELAYED_RELEASE_TABLET | Freq: Every day | ORAL | Status: DC | PRN

## 2019-07-02 MED ORDER — SODIUM CHLORIDE 0.9 % IV SOLN
200.0000 mg | Freq: Once | INTRAVENOUS | Status: AC
  Administered 2019-07-02: 200 mg via INTRAVENOUS
  Filled 2019-07-02: qty 200

## 2019-07-02 MED ORDER — TOCILIZUMAB 400 MG/20ML IV SOLN
8.0000 mg/kg | Freq: Once | INTRAVENOUS | Status: AC
  Administered 2019-07-02: 726 mg via INTRAVENOUS
  Filled 2019-07-02: qty 36.3

## 2019-07-02 MED ORDER — ACETAMINOPHEN 650 MG RE SUPP: 650.0000 mg | Freq: Four times a day (QID) | RECTAL | Status: DC | PRN

## 2019-07-02 MED ORDER — ZINC SULFATE 220 (50 ZN) MG PO CAPS
220.0000 mg | ORAL_CAPSULE | Freq: Every day | ORAL | Status: DC
  Administered 2019-07-02 – 2019-07-11 (×10): 220 mg via ORAL
  Filled 2019-07-02 (×10): qty 1

## 2019-07-02 MED ORDER — CARVEDILOL 6.25 MG PO TABS
6.2500 mg | ORAL_TABLET | Freq: Two times a day (BID) | ORAL | Status: DC
  Administered 2019-07-03 – 2019-07-11 (×17): 6.25 mg via ORAL
  Filled 2019-07-02 (×18): qty 1

## 2019-07-02 MED ORDER — ASCORBIC ACID 500 MG PO TABS
500.0000 mg | ORAL_TABLET | Freq: Every day | ORAL | Status: DC
  Administered 2019-07-02 – 2019-07-11 (×10): 500 mg via ORAL
  Filled 2019-07-02 (×10): qty 1

## 2019-07-02 MED ORDER — IPRATROPIUM-ALBUTEROL 20-100 MCG/ACT IN AERS
1.0000 | INHALATION_SPRAY | Freq: Four times a day (QID) | RESPIRATORY_TRACT | Status: DC
  Administered 2019-07-03 – 2019-07-09 (×25): 1 via RESPIRATORY_TRACT
  Filled 2019-07-02 (×2): qty 4

## 2019-07-02 MED ORDER — DEXAMETHASONE SODIUM PHOSPHATE 10 MG/ML IJ SOLN
6.0000 mg | INTRAMUSCULAR | Status: DC
  Administered 2019-07-02 – 2019-07-10 (×9): 6 mg via INTRAVENOUS
  Filled 2019-07-02 (×9): qty 1

## 2019-07-02 MED ORDER — SODIUM CHLORIDE 0.9 % IV SOLN: 200.0000 mg | Freq: Once | INTRAVENOUS | Status: DC

## 2019-07-02 MED ORDER — SPIRONOLACTONE 12.5 MG HALF TABLET
12.5000 mg | ORAL_TABLET | Freq: Every day | ORAL | Status: DC
  Administered 2019-07-02 – 2019-07-11 (×10): 12.5 mg via ORAL
  Filled 2019-07-02 (×10): qty 1

## 2019-07-02 MED ORDER — ONDANSETRON HCL 4 MG/2ML IJ SOLN: 4.0000 mg | Freq: Four times a day (QID) | INTRAMUSCULAR | Status: DC | PRN

## 2019-07-02 NOTE — ED Triage Notes (Signed)
Per EMS- patient is from [facility]. Patient is normally in the assisted living portion, but has been in the ECF area due to being + Covid on 12/24 and 12/29. Staff noted that the patient has had elevated d-dimers. Lungs clear.  Facility placed patient on O2 due to protocol.

## 2019-07-02 NOTE — ED Provider Notes (Signed)
  Physical Exam  BP 140/84   Pulse 87   Temp 98 F (36.7 C) (Oral)   Resp (!) 21   Ht 5\' 11"  (1.803 m)   Wt 90.7 kg   SpO2 94%   BMI 27.89 kg/m   Physical Exam  ED Course/Procedures     Procedures  MDM  Patient care assumed at 1500.  Patient referred from [facility] home for evaluation of elevation in his D dimer. He was diagnosed with COVID-19 infection on December 24. On evaluation at bedside patient only complaints of back pain. He is in no respiratory distress. CTA PE protocol as well as CBC pending.  Attempted to contact [place] for additional information - no answer.    Contacted patient's son, Remo Lipps with updated findings of studies.    Per son patient had a decline starting about one year ago following a fall.  He moved to [facility] and was locked in his room due to [place].  Over the last month he had a fall and he has decompensated significantly.  Son is planning to talk to attorney to get healthcare POA on Monday.     Call [phone] Tammy Sours first for updates/questions.     Patient has had some borderline thoughts, dropping down to the 90s with associated tachypnea - he was placed on 2L nasal cannula.   CT demonstrates multifocal infiltrates - Will treat with Decadron for COVID pneumonia.  Timothy Duncan was evaluated in Emergency Department on 07/02/2019 for the symptoms described in the history of present illness. He was evaluated in the context of the global COVID-19 pandemic, which necessitated consideration that the patient might be at risk for infection with the SARS-CoV-2 virus that causes COVID-19. Institutional protocols and algorithms that pertain to the evaluation of patients at risk for COVID-19 are in a state of rapid change based on information released by regulatory bodies including the CDC and federal and state organizations. These policies and algorithms were followed during the patient's care in the ED.    Quintella Reichert,  MD 07/02/19 [phone]

## 2019-07-02 NOTE — ED Notes (Signed)
Pt transported to CT ?

## 2019-07-02 NOTE — H&P (Signed)
History and Physical    Timothy Duncan [id number] DOB: 1940/08/23 DOA: 07/02/2019  PCP: Caren Macadam, MD  Patient coming from: [place] have personally briefly reviewed patient's old medical records in [place]  Chief Complaint: Dyspnea, elevated ddimer, + Covid 12/24  HPI: Timothy Duncan is a 79 y.o. male with medical history significant of dementia, essential hypertension, chronic systolic congestive heart failure, hyperlipidemia, history of DVT off anticoagulation secondary to frequent falls, CVA who presents from [facility] with dyspnea, hypoxia, and report reportedly elevated D-dimer.  Patient with known positive Covid-19 diagnosed on 06/23/2019.  Apparently he was noted to be hypoxic today, labs reported outpatient with elevated D-dimer and was sent to the ED for further evaluation.  Patient with underlying dementia, unable to participate fully in ROS.  Discussed with patient's family, son [name] and daughter-in-law [name]; they have had little interaction with patient since his recent hospitalization at [facility] on 12/30-12/11 while at the nursing facility; and have no insight into his father's care during this time.  ED Course: Temperature 98.0, HR 83, RR 26, SPO2 96% on 2 L nasal cannula (was brought in by EMS on oxygen, reportedly hypoxic in the 80s); BP 139/84.  The BC count 9.0, hemoglobin 12.6, platelets 422.  Sodium 145, potassium 4.0, chloride 107, CO2 25, BUN 17, creatinine 1.12, glucose 102.  AST 50, ALT 48.  Total bilirubin 1.3.  CRP 8.9, lactic acid 3.1, ferritin 1009.  Procalcitonin less than 0.10.  Chest x-ray with right upper lobe consolidation significant for likely underlying pneumonia.  CTPA negative for pulmonary embolism but with multilobar airspace opacification consistent with multifocal pneumonia.  Given patient's hypoxia, elevated inflammatory markers and underlying dyspnea, EDP referred patient for admission for further  evaluation and treatment of multifocal pneumonia related to active COVID-19 infection.  Review of Systems: As per HPI otherwise 10 point review of systems negative.    Past Medical History:  Diagnosis Date  . DVT (deep venous thrombosis) ([place]) 07/2015   RLE  . ED (erectile dysfunction)   . Hypertension   . Pulmonary embolism ([place])   . TIA (transient ischemic attack)     Past Surgical History:  Procedure Laterality Date  . APPENDECTOMY    . CATARACT EXTRACTION W/ INTRAOCULAR LENS  IMPLANT, BILATERAL Bilateral   . COLONOSCOPY    . LUMBAR LAMINECTOMY/DECOMPRESSION MICRODISCECTOMY Right 05/03/2015   Procedure: Microdiscectomy - Lumbar four-Lumbar five - right ;  Surgeon: Eustace Moore, MD;  Location: [facility];  Service: Neurosurgery;  Laterality: Right;  . TONSILLECTOMY    . WOUND EXPLORATION N/A 06/06/2015   Procedure: Presumed discitis, Lumbar exploration,Repeat discectomy Lumbar four-five with deep tissue culture;  Surgeon: Eustace Moore, MD;  Location: [facility];  Service: Neurosurgery;  Laterality: N/A;     reports that he has quit smoking. His smoking use included cigarettes. He has a 90.00 pack-year smoking history. He has quit using smokeless tobacco.  His smokeless tobacco use included snuff and chew. He reports that he does not drink alcohol or use drugs.  No Known Allergies  Family History  Problem Relation Age of Onset  . Early death Father        MVA  . Alcohol abuse Maternal Grandfather     Family history reviewed and not pertinent   Prior to Admission medications   Medication Sig Start Date End Date Taking? Authorizing Provider  carvedilol (COREG) 6.25 MG tablet Take 1 tablet (6.25  mg total) by mouth 2 (two) times daily with a meal. 07/07/18 07/02/19 Yes Nahser, Wonda Cheng, MD  losartan (COZAAR) 25 MG tablet Take 1 tablet (25 mg total) by mouth daily. 07/07/18 07/02/19 Yes Nahser, Wonda Cheng, MD  spironolactone (ALDACTONE) 25 MG tablet Take 0.5 tablets (12.5 mg total)  by mouth daily. 07/07/18 07/02/19 Yes Nahser, Wonda Cheng, MD    Physical Exam: Vitals:   07/02/19 1540 07/02/19 1600 07/02/19 1700 07/02/19 1715  BP: 137/68 139/84 (!) 141/89   Pulse: 80 83  82  Resp: (!) 26  (!) 24 18  Temp:      TempSrc:      SpO2: 93% 97%  97%  Weight:      Height:        Constitutional: NAD, calm, comfortable; but agitated at times when asked questions Eyes: PERRL, lids and conjunctivae normal ENMT: Mucous membranes are moist. Posterior pharynx clear of any exudate or lesions.Normal dentition.  Neck: normal, supple, no masses, no thyromegaly Respiratory: Slightly decreased bilateral bases, slightly increased respiratory effort, no wheezing, on 2 L nasal cannula oxygenating 96% (not on home O2)  Cardiovascular: Regular rate and rhythm, no murmurs / rubs / gallops. No extremity edema. 2+ pedal pulses. No carotid bruits.  Abdomen: no tenderness, no masses palpated. No hepatosplenomegaly. Bowel sounds positive.  Musculoskeletal: no clubbing / cyanosis. No joint deformity upper and lower extremities. Good ROM, no contractures. Normal muscle tone.  Skin: no rashes, lesions, ulcers. No induration Neurologic: CN 2-12 grossly intact. Sensation intact, DTR normal. Strength 5/5 in all 4.  Psychiatric: Poor judgment and insight. Alert, oriented to place (hospital/[place]), but not time, person, nor situation. Normal mood.    Labs on Admission: I have personally reviewed following labs and imaging studies  CBC: Recent Labs  Lab 07/02/19 1505  WBC 9.0  NEUTROABS 5.6  HGB 12.6*  HCT 40.1  MCV 97.1  PLT [id number]*   Basic Metabolic Panel: Recent Labs  Lab 07/02/19 1203  NA 145  K 4.0  CL 107  CO2 25  GLUCOSE 102*  BUN 17  CREATININE 1.12  CALCIUM 9.1   GFR: Estimated Creatinine Clearance: 62.7 mL/min (by C-G formula based on SCr of 1.12 mg/dL). Liver Function Tests: Recent Labs  Lab 07/02/19 1203  AST 50*  ALT 48*  ALKPHOS 58  BILITOT 1.3*  PROT 7.9    ALBUMIN 3.3*   No results for input(s): LIPASE, AMYLASE in the last 168 hours. No results for input(s): AMMONIA in the last 168 hours. Coagulation Profile: No results for input(s): INR, PROTIME in the last 168 hours. Cardiac Enzymes: No results for input(s): CKTOTAL, CKMB, CKMBINDEX, TROPONINI in the last 168 hours. BNP (last 3 results) No results for input(s): PROBNP in the last 8760 hours. HbA1C: No results for input(s): HGBA1C in the last 72 hours. CBG: No results for input(s): GLUCAP in the last 168 hours. Lipid Profile: Recent Labs    07/02/19 1204  TRIG 89   Thyroid Function Tests: No results for input(s): TSH, T4TOTAL, FREET4, T3FREE, THYROIDAB in the last 72 hours. Anemia Panel: Recent Labs    07/02/19 1204  FERRITIN 1,009*   Urine analysis:    Component Value Date/Time   COLORURINE YELLOW 06/15/2018 0126   APPEARANCEUR CLEAR 06/15/2018 0126   LABSPEC 1.014 06/15/2018 0126   PHURINE 6.0 06/15/2018 0126   GLUCOSEU NEGATIVE 06/15/2018 0126   GLUCOSEU NEGATIVE 05/05/2018 1237   HGBUR SMALL (A) 06/15/2018 0126   HGBUR negative 04/19/2009 1004  BILIRUBINUR NEGATIVE 06/15/2018 0126   BILIRUBINUR n 04/28/2017 1426   KETONESUR 20 (A) 06/15/2018 0126   PROTEINUR NEGATIVE 06/15/2018 0126   UROBILINOGEN 0.2 05/05/2018 1237   NITRITE NEGATIVE 06/15/2018 0126   LEUKOCYTESUR NEGATIVE 06/15/2018 0126          Radiological Exams on Admission: CT Angio Chest PE W and/or Wo Contrast  Result Date: 07/02/2019 CLINICAL DATA:  PE suspected, low/intermediate probability. Positive D-dimer. SOB. EXAM: CT ANGIOGRAPHY CHEST WITH CONTRAST TECHNIQUE: Multidetector CT imaging of the chest was performed using the standard protocol during bolus administration of intravenous contrast. Multiplanar CT image reconstructions and MIPs were obtained to evaluate the vascular anatomy. CONTRAST:  57mL OMNIPAQUE IOHEXOL 350 MG/ML SOLN COMPARISON:  None. FINDINGS: Cardiovascular: The heart  size is mildly enlarged. There is aortic atherosclerosis. Calcifications in the LAD, left circumflex and RCA coronary arteries. The main pulmonary artery is patent. There is no central obstructing pulmonary embolus identified. No lobar pulmonary artery filling defects identified. There is respiratory motion artifact which diminishes exam detail beyond the level of the lobar pulmonary arteries. Mediastinum/Nodes: The trachea appears patent and is midline. Insert esophagus Normal appearance of the thyroid gland. No mediastinal or hilar adenopathy identified. Lungs/Pleura: No pleural effusion identified. Diminished pulmonary parenchymal detail secondary to respiratory motion artifact. Multifocal bilateral airspace opacities are identified. This is most severe in the right upper lobe and is favored to represent multifocal infection. Upper Abdomen: No acute abnormality. Musculoskeletal: No acute abnormality. Chronic, mild superior endplate deformity involves the T12 vertebra. Unchanged from 06/02/2019. Review of the MIP images confirms the above findings. IMPRESSION: 1. Exam detail diminished secondary to respiratory motion artifact. No evidence for acute pulmonary embolus to the level of the lobar pulmonary arteries. 2. Multifocal bilateral airspace opacities are identified compatible with multifocal pneumonia. 3. Aortic atherosclerosis. Multi vessel coronary artery calcifications noted. Aortic Atherosclerosis (ICD10-I70.0). Electronically Signed   By: Kerby Moors M.D.   On: 07/02/2019 15:40   DG Chest Port 1 View  Result Date: 07/02/2019 CLINICAL DATA:  Pt is Covid positive. Staff at nursing facility noted that the patient has had elevated d-dimers. H/o HTN, Pulmonary Embolism, and TIA. Former smoker. EXAM: PORTABLE CHEST 1 VIEW COMPARISON:  Chest radiograph 06/04/2019 FINDINGS: Stable cardiomediastinal contours. There is new consolidation throughout the right upper lobe suspicious for infection. The left lung is  clear. No pneumothorax or large pleural effusion. No acute finding in the visualized skeleton. IMPRESSION: New right upper lobe consolidation suspicious for pneumonia. Recommend follow-up radiograph in 3-4 weeks to ensure resolution. Electronically Signed   By: Audie Pinto M.D.   On: 07/02/2019 12:39    EKG: Independently reviewed.   Assessment/Plan Principal Problem:   COVID-19 virus infection Active Problems:   Essential hypertension   Backache   History of CVA (cerebrovascular accident)   Chronic combined systolic and diastolic CHF (congestive heart failure) (HCC)   History of DVT (deep vein thrombosis)   Elevated LFTs   Lactic acidosis   Acute hypoxic respiratory failure secondary to acute Covid-19 viral pneumonia during the ongoing 2020 Covid 19 Pandemic - POA Patient presenting from [facility] home after being found hypoxic today with an elevated D-dimer.  He is known Covid positive, from a 06/23/2019.  Reported hypoxic by EMS, placed on 2 L nasal cannula.  Chest x-ray with right upper lobe consolidation, CTPA with multi lobar airspace opacities consistent with multifocal pneumonia.  Elevated inflammatory markers to include CRP 8.7, ferritin 1009.  Procalcitonin less than 0.10. --Admit to  inpatient --Will order Actemra with elevated CRP >7.0 --remdesivir, plan 5-day course --Decadron 6 mg IV daily --Continue supplemental oxygen, titrate to maintain SPO2 greater than 92%; currently on 2 L nasal cannula --Continue supportive care with vitamin C, zinc, Tylenol, Combivent  --Follow CBC, CMP, D-dimer, ferritin, and CRP daily --Continue airborne/contact isolation precautions  Essential hypertension Combined systolic and diastolic congestive heart failure, appears compensated BP 139/84, fairly well controlled.  Review of TTE dated 06/19/2018 with EF 25-30%, LV dilation, grade 1 diastolic dysfunction, diffuse hypokinesis.  Appears well compensated.  No vascular congestion noted on  chest x-ray nor CTPA. --Check BNP --Continue home carvedilol 6.25 mg p.o. daily, spironolactone 12.5 mg daily, losartan 25 mg daily --Strict I's and O's --Daily weights  History of DVT Patient reportedly has been off of anticoagulation secondary to frequent falls. --CTPA negative for pulmonary embolism --Check lower extremity venous duplex ultrasound given elevated D-dimer  Questionable history of aspiration/dysphagia --Speech therapy evaluation --Aspiration precautions  Weakness/debility: Patient with history of multiple falls, recent hospitalization at [facility] from 12/3 through 06/10/2019 for failure to thrive; and subsequently discharged to nursing facility. --PT/OT evaluation --Fall precautions    DVT prophylaxis: Lovenox Code Status: Full code (verified by patient's son Latravius Cullinan, " do anything to keep him alive at this point") Family Communication: Updated patient's son Milan Deo and his wife Jossue Sumi regarding admission Disposition Plan: Likely return back to [facility] vs need for increased care at [facility] Consults called: Palliative care, ordered per epic Admission status: Inpatient   Severity of Illness: The appropriate patient status for this patient is INPATIENT. Inpatient status is judged to be reasonable and necessary in order to provide the required intensity of service to ensure the patient's safety. The patient's presenting symptoms, physical exam findings, and initial radiographic and laboratory data in the context of their chronic comorbidities is felt to place them at high risk for further clinical deterioration. Furthermore, it is not anticipated that the patient will be medically stable for discharge from the hospital within 2 midnights of admission. The following factors support the patient status of inpatient.   " The patient's presenting symptoms include confusion, service of breath, back pain " The worrisome physical exam  findings include hypoxia, slightly increased work of breathing " The initial radiographic and laboratory data are worrisome because of elevated D-dimer, positive Covid-19, elevated CRP, elevated lactic acid, elevated LFTs, " The chronic co-morbidities include dementia, chronic systolic congestive heart failure, HTN, history of DVT off anticoagulation due to frequent falls, CVA 2017, HLD.   * I certify that at the point of admission it is my clinical judgment that the patient will require inpatient hospital care spanning beyond 2 midnights from the point of admission due to high intensity of service, high risk for further deterioration and high frequency of surveillance required.*    Alireza Pollack J [place] DO [facility] Available via Epic secure chat 7am-7pm After these hours, please refer to coverage provider listed on [email or web address] 07/02/2019, 5:57 PM

## 2019-07-02 NOTE — ED Provider Notes (Signed)
Emergency Department Provider Note   I have reviewed the triage vital signs and the nursing notes.   HISTORY  Chief Complaint Covid + and Abnormal Lab   HPI Timothy Duncan is a 79 y.o. male with PMH of HTN, PE, and known COVID 19 diagnosis since 12/24 presents to the ED by EMS from [facility] with report of "increasing d-dimer." Patient arrives on 3 L nasal cannula "per facility protocol" per EMS.  Patient denies chest pain or shortness of breath.  He does have some underlying confusion and is unable to provide a significant history although is awake and in no acute distress. Similar mental status exam documented in Cardiology note from 1 year ago. Level 5 caveat applies.   Past Medical History:  Diagnosis Date  . DVT (deep venous thrombosis) ([place]) 07/2015   RLE  . ED (erectile dysfunction)   . Hypertension   . Pulmonary embolism ([place])   . TIA (transient ischemic attack)     Patient Active Problem List   Diagnosis Date Noted  . COVID-19 virus infection 07/02/2019  . Elevated LFTs 07/02/2019  . Lactic acidosis 07/02/2019  . History of DVT (deep vein thrombosis) 10/11/2018  . Chronic combined systolic and diastolic CHF (congestive heart failure) ([place]) 10/10/2018  . Hyperlipidemia 10/10/2018  . Hypernatremia 06/15/2018  . Acute metabolic encephalopathy [id number]  . Hypokalemia 06/15/2018  . Rhabdomyolysis 06/15/2018  . Acute hypernatremia 06/15/2018  . Dehydration   . Routine general medical examination at a health care facility 04/28/2017  . Lipids abnormal 04/28/2017  . History of CVA (cerebrovascular accident)   . S/P lumbar laminectomy 05/03/2015  . Actinic keratosis 02/04/2011  . Backache 09/03/2009  . ERECTILE DYSFUNCTION 03/24/2008  . Essential hypertension 03/24/2008    Past Surgical History:  Procedure Laterality Date  . APPENDECTOMY    . CATARACT EXTRACTION W/ INTRAOCULAR LENS  IMPLANT, BILATERAL Bilateral   . COLONOSCOPY    . LUMBAR  LAMINECTOMY/DECOMPRESSION MICRODISCECTOMY Right 05/03/2015   Procedure: Microdiscectomy - Lumbar four-Lumbar five - right ;  Surgeon: Eustace Moore, MD;  Location: [facility];  Service: Neurosurgery;  Laterality: Right;  . TONSILLECTOMY    . WOUND EXPLORATION N/A 06/06/2015   Procedure: Presumed discitis, Lumbar exploration,Repeat discectomy Lumbar four-five with deep tissue culture;  Surgeon: Eustace Moore, MD;  Location: [facility];  Service: Neurosurgery;  Laterality: N/A;    Allergies Patient has no known allergies.  Family History  Problem Relation Age of Onset  . Early death Father        MVA  . Alcohol abuse Maternal Grandfather     Social History Social History   Tobacco Use  . Smoking status: Former Smoker    Packs/day: 3.00    Years: 30.00    Pack years: 90.00    Types: Cigarettes  . Smokeless tobacco: Former Systems developer    Types: Snuff, Chew  . Tobacco comment: "quit smoking in the 1980s; used chew and snuff off and on, none for awhile"  Substance Use Topics  . Alcohol use: No    Alcohol/week: 0.0 standard drinks    Comment: 03/31/2016 "no drinking since 06/22/2005"  . Drug use: No    Review of Systems  Level 5 caveat: Confusion.   ____________________________________________   PHYSICAL EXAM:  VITAL SIGNS: ED Triage Vitals  Enc Vitals Group     BP 07/02/19 1118 127/77     Pulse Rate 07/02/19 1118 77     Resp 07/02/19 1118 (!) 25  Temp 07/02/19 1118 98 F (36.7 C)     Temp Source 07/02/19 1118 Oral     SpO2 07/02/19 1111 96 %     Weight 07/02/19 1127 200 lb (90.7 kg)     Height 07/02/19 1127 5\' 11"  (1.803 m)   Constitutional: Alert but confused. Following commands briskly. Well appearing and in no acute distress. Eyes: Conjunctivae are normal.  Head: Atraumatic. Nose: No congestion/rhinnorhea. Mouth/Throat: Mucous membranes are moist. Neck: No stridor.   Cardiovascular: Normal rate, regular rhythm. Good peripheral circulation. Grossly normal  heart sounds.   Respiratory: Normal respiratory effort.  No retractions. Lungs CTAB. Gastrointestinal: Soft and nontender. No distention.  Musculoskeletal: No lower extremity tenderness nor edema. No gross deformities of extremities. Neurologic:  Normal speech and language. No gross focal neurologic deficits are appreciated.  Skin:  Skin is warm, dry and intact. No rash noted.  ____________________________________________   LABS (all labs ordered are listed, but only abnormal results are displayed)  Labs Reviewed  LACTIC ACID, PLASMA - Abnormal; Notable for the following components:      Result Value   Lactic Acid, Venous 3.1 (*)    All other components within normal limits  COMPREHENSIVE METABOLIC PANEL - Abnormal; Notable for the following components:   Glucose, Bld 102 (*)    Albumin 3.3 (*)    AST 50 (*)    ALT 48 (*)    Total Bilirubin 1.3 (*)    All other components within normal limits  D-DIMER, QUANTITATIVE (NOT AT [facility]) - Abnormal; Notable for the following components:   D-Dimer, Quant 11.55 (*)    All other components within normal limits  LACTATE DEHYDROGENASE - Abnormal; Notable for the following components:   LDH 200 (*)    All other components within normal limits  FERRITIN - Abnormal; Notable for the following components:   Ferritin 1,009 (*)    All other components within normal limits  FIBRINOGEN - Abnormal; Notable for the following components:   Fibrinogen >800 (*)    All other components within normal limits  C-REACTIVE PROTEIN - Abnormal; Notable for the following components:   CRP 8.7 (*)    All other components within normal limits  CBC WITH DIFFERENTIAL/PLATELET - Abnormal; Notable for the following components:   RBC 4.13 (*)    Hemoglobin 12.6 (*)    Platelets 422 (*)    Monocytes Absolute 1.1 (*)    Abs Immature Granulocytes 0.13 (*)    All other components within normal limits  CULTURE, BLOOD (ROUTINE X 2)  CULTURE, BLOOD (ROUTINE X 2)  SARS  CORONAVIRUS 2 (TAT 6-24 HRS)  LACTIC ACID, PLASMA  PROCALCITONIN  TRIGLYCERIDES  CBC WITH DIFFERENTIAL/PLATELET  CBC WITH DIFFERENTIAL/PLATELET  PROCALCITONIN  CBC  CREATININE, SERUM  TSH  HEMOGLOBIN A1C  CBC WITH DIFFERENTIAL/PLATELET  COMPREHENSIVE METABOLIC PANEL  C-REACTIVE PROTEIN  D-DIMER, QUANTITATIVE (NOT AT [facility])  FERRITIN  BRAIN NATRIURETIC PEPTIDE  ABO/RH   ____________________________________________  EKG   EKG Interpretation  Date/Time:  Saturday July 02 2019 12:22:08 EST Ventricular Rate:  73 PR Interval:    QRS Duration: 166 QT Interval:  464 QTC Calculation: 512 R Axis:   -49 Text Interpretation: Sinus rhythm Left bundle branch block No STEMI Confirmed by Nanda Quinton [phone]) on 07/02/2019 12:48:22 PM       ____________________________________________  RADIOLOGY  CT Angio Chest PE W and/or Wo Contrast  Result Date: 07/02/2019 CLINICAL DATA:  PE suspected, low/intermediate probability. Positive D-dimer. SOB. EXAM: CT ANGIOGRAPHY CHEST WITH  CONTRAST TECHNIQUE: Multidetector CT imaging of the chest was performed using the standard protocol during bolus administration of intravenous contrast. Multiplanar CT image reconstructions and MIPs were obtained to evaluate the vascular anatomy. CONTRAST:  58mL OMNIPAQUE IOHEXOL 350 MG/ML SOLN COMPARISON:  None. FINDINGS: Cardiovascular: The heart size is mildly enlarged. There is aortic atherosclerosis. Calcifications in the LAD, left circumflex and RCA coronary arteries. The main pulmonary artery is patent. There is no central obstructing pulmonary embolus identified. No lobar pulmonary artery filling defects identified. There is respiratory motion artifact which diminishes exam detail beyond the level of the lobar pulmonary arteries. Mediastinum/Nodes: The trachea appears patent and is midline. Insert esophagus Normal appearance of the thyroid gland. No mediastinal or hilar adenopathy identified. Lungs/Pleura: No  pleural effusion identified. Diminished pulmonary parenchymal detail secondary to respiratory motion artifact. Multifocal bilateral airspace opacities are identified. This is most severe in the right upper lobe and is favored to represent multifocal infection. Upper Abdomen: No acute abnormality. Musculoskeletal: No acute abnormality. Chronic, mild superior endplate deformity involves the T12 vertebra. Unchanged from 06/02/2019. Review of the MIP images confirms the above findings. IMPRESSION: 1. Exam detail diminished secondary to respiratory motion artifact. No evidence for acute pulmonary embolus to the level of the lobar pulmonary arteries. 2. Multifocal bilateral airspace opacities are identified compatible with multifocal pneumonia. 3. Aortic atherosclerosis. Multi vessel coronary artery calcifications noted. Aortic Atherosclerosis (ICD10-I70.0). Electronically Signed   By: Kerby Moors M.D.   On: 07/02/2019 15:40   DG Chest Port 1 View  Result Date: 07/02/2019 CLINICAL DATA:  Pt is Covid positive. Staff at nursing facility noted that the patient has had elevated d-dimers. H/o HTN, Pulmonary Embolism, and TIA. Former smoker. EXAM: PORTABLE CHEST 1 VIEW COMPARISON:  Chest radiograph 06/04/2019 FINDINGS: Stable cardiomediastinal contours. There is new consolidation throughout the right upper lobe suspicious for infection. The left lung is clear. No pneumothorax or large pleural effusion. No acute finding in the visualized skeleton. IMPRESSION: New right upper lobe consolidation suspicious for pneumonia. Recommend follow-up radiograph in 3-4 weeks to ensure resolution. Electronically Signed   By: Audie Pinto M.D.   On: 07/02/2019 12:39    ____________________________________________   PROCEDURES  Procedure(s) performed:   Procedures  CRITICAL CARE Performed by: Margette Fast Total critical care time: 35 minutes Critical care time was exclusive of separately billable procedures and  treating other patients. Critical care was necessary to treat or prevent imminent or life-threatening deterioration. Critical care was time spent personally by me on the following activities: development of treatment plan with patient and/or surrogate as well as nursing, discussions with consultants, evaluation of patient's response to treatment, examination of patient, obtaining history from patient or surrogate, ordering and performing treatments and interventions, ordering and review of laboratory studies, ordering and review of radiographic studies, pulse oximetry and re-evaluation of patient's condition.  Nanda Quinton, MD Emergency Medicine  ____________________________________________   INITIAL IMPRESSION / ASSESSMENT AND PLAN / ED COURSE  Pertinent labs & imaging results that were available during my care of the patient were reviewed by me and considered in my medical decision making (see chart for details).   Patient with known COVID-19 infection presents with report of elevated D-dimer.  In patient's history does have history of DVT documented but is not anticoagulated with history of frequent falls.  He denies any chest pain or shortness of breath but is exhibiting some confusion in the ED and is unable to provide a detailed history.  Would expect elevated D-dimer  in the setting of COVID-19.  Are available history at this time does not show patient is anticoagulated.  I will attempt to confirm his home medication list.  Will send preadmit Covid labs.  I have turned off the patient's oxygen and will follow his room air saturations.   Pra-admit labs reviewed with significantly elevated d-dimer. CTA pending. Attempted to reach ALF and family for further information without success. Care transferred to Dr. Ralene Bathe pending CTA and reassess.  ____________________________________________  FINAL CLINICAL IMPRESSION(S) / ED DIAGNOSES  Final diagnoses:  None     MEDICATIONS GIVEN DURING THIS  VISIT:  Medications  sodium chloride (PF) 0.9 % injection (  Not Given 07/02/19 1608)  carvedilol (COREG) tablet 6.25 mg (has no administration in time range)  losartan (COZAAR) tablet 25 mg (has no administration in time range)  spironolactone (ALDACTONE) tablet 12.5 mg (has no administration in time range)  enoxaparin (LOVENOX) injection 40 mg (has no administration in time range)  acetaminophen (TYLENOL) tablet 650 mg (has no administration in time range)    Or  acetaminophen (TYLENOL) suppository 650 mg (has no administration in time range)  oxyCODONE (Oxy IR/ROXICODONE) immediate release tablet 5 mg (has no administration in time range)  senna-docusate (Senokot-S) tablet 1 tablet (has no administration in time range)  bisacodyl (DULCOLAX) EC tablet 5 mg (has no administration in time range)  ondansetron (ZOFRAN) tablet 4 mg (has no administration in time range)    Or  ondansetron (ZOFRAN) injection 4 mg (has no administration in time range)  tocilizumab (ACTEMRA) 8 mg/kg = 726 mg in sodium chloride 0.9 % 100 mL infusion (has no administration in time range)  Ipratropium-Albuterol (COMBIVENT) respimat 1 puff (has no administration in time range)  dexamethasone (DECADRON) injection 6 mg (has no administration in time range)  guaiFENesin-dextromethorphan (ROBITUSSIN DM) 100-10 MG/5ML syrup 10 mL (has no administration in time range)  ascorbic acid (VITAMIN C) tablet 500 mg (has no administration in time range)  zinc sulfate capsule 220 mg (has no administration in time range)  remdesivir 200 mg in sodium chloride 0.9% 250 mL IVPB (200 mg Intravenous New Bag/Given 07/02/19 1903)    Followed by  remdesivir 100 mg in sodium chloride 0.9 % 100 mL IVPB (has no administration in time range)  Melatonin TABS 6 mg (has no administration in time range)  iohexol (OMNIPAQUE) 350 MG/ML injection 80 mL (80 mLs Intravenous Contrast Given 07/02/19 1530)  sodium chloride 0.9 % bolus 500 mL (0 mLs Intravenous  Stopped 07/02/19 1800)  dexamethasone (DECADRON) injection 6 mg (6 mg Intravenous Given 07/02/19 1723)     Note:  This document was prepared using Dragon voice recognition software and may include unintentional dictation errors.  Nanda Quinton, MD, [facility] Emergency Medicine    Josephine Rudnick, [facility], MD 07/02/19 2025

## 2019-07-03 ENCOUNTER — Inpatient Hospital Stay (HOSPITAL_COMMUNITY): Payer: Medicare HMO

## 2019-07-03 DIAGNOSIS — U071 COVID-19: Secondary | ICD-10-CM

## 2019-07-03 DIAGNOSIS — R791 Abnormal coagulation profile: Secondary | ICD-10-CM

## 2019-07-03 LAB — HEMOGLOBIN A1C
Hgb A1c MFr Bld: 5.8 % — ABNORMAL HIGH (ref 4.8–5.6)
Mean Plasma Glucose: 119.76 mg/dL

## 2019-07-03 LAB — COMPREHENSIVE METABOLIC PANEL
ALT: 36 U/L (ref 0–44)
AST: 33 U/L (ref 15–41)
Albumin: 2.9 g/dL — ABNORMAL LOW (ref 3.5–5.0)
Alkaline Phosphatase: 52 U/L (ref 38–126)
Anion gap: 10 (ref 5–15)
BUN: 16 mg/dL (ref 8–23)
CO2: 23 mmol/L (ref 22–32)
Calcium: 8.8 mg/dL — ABNORMAL LOW (ref 8.9–10.3)
Chloride: 110 mmol/L (ref 98–111)
Creatinine, Ser: 0.97 mg/dL (ref 0.61–1.24)
GFR calc Af Amer: >60 mL/min (ref >60)
GFR calc non Af Amer: >60 mL/min (ref >60)
Glucose, Bld: 146 mg/dL — ABNORMAL HIGH (ref 70–99)
Potassium: 4.6 mmol/L (ref 3.5–5.1)
Sodium: 143 mmol/L (ref 135–145)
Total Bilirubin: 0.7 mg/dL (ref 0.3–1.2)
Total Protein: 7.1 g/dL (ref 6.5–8.1)

## 2019-07-03 LAB — CBC WITH DIFFERENTIAL/PLATELET
Abs Immature Granulocytes: 0.09 10*3/uL — ABNORMAL HIGH (ref 0.00–0.07)
Basophils Absolute: 0 10*3/uL (ref 0.0–0.1)
Basophils Relative: 0 %
Eosinophils Absolute: 0 10*3/uL (ref 0.0–0.5)
Eosinophils Relative: 0 %
HCT: 43.7 % (ref 39.0–52.0)
Hemoglobin: 13.9 g/dL (ref 13.0–17.0)
Immature Granulocytes: 1 %
Lymphocytes Relative: 13 %
Lymphs Abs: 0.9 10*3/uL (ref 0.7–4.0)
MCH: 31 pg (ref 26.0–34.0)
MCHC: 31.8 g/dL (ref 30.0–36.0)
MCV: 97.3 fL (ref 80.0–100.0)
Monocytes Absolute: 0.1 10*3/uL (ref 0.1–1.0)
Monocytes Relative: 2 %
Neutro Abs: 5.6 10*3/uL (ref 1.7–7.7)
Neutrophils Relative %: 84 %
Platelets: 432 10*3/uL — ABNORMAL HIGH (ref 150–400)
RBC: 4.49 MIL/uL (ref 4.22–5.81)
RDW: 13.2 % (ref 11.5–15.5)
WBC: 6.7 10*3/uL (ref 4.0–10.5)
nRBC: 0 % (ref 0.0–0.2)

## 2019-07-03 LAB — D-DIMER, QUANTITATIVE: D-Dimer, Quant: 8.19 ug/mL-FEU — ABNORMAL HIGH (ref 0.00–0.50)

## 2019-07-03 LAB — SARS CORONAVIRUS 2 (TAT 6-24 HRS): SARS Coronavirus 2: POSITIVE — AB

## 2019-07-03 LAB — PROCALCITONIN: Procalcitonin: <0.1 ng/mL

## 2019-07-03 MED ORDER — APIXABAN 5 MG PO TABS
10.0000 mg | ORAL_TABLET | Freq: Two times a day (BID) | ORAL | Status: AC
  Administered 2019-07-03 – 2019-07-09 (×14): 10 mg via ORAL
  Filled 2019-07-03 (×13): qty 2
  Filled 2019-07-03: qty 4
  Filled 2019-07-03: qty 2

## 2019-07-03 MED ORDER — APIXABAN 5 MG PO TABS
5.0000 mg | ORAL_TABLET | Freq: Two times a day (BID) | ORAL | Status: DC
  Administered 2019-07-10 – 2019-07-11 (×3): 5 mg via ORAL
  Filled 2019-07-03 (×3): qty 1

## 2019-07-03 MED ORDER — ENSURE ENLIVE PO LIQD
237.0000 mL | Freq: Two times a day (BID) | ORAL | Status: DC
  Administered 2019-07-04 – 2019-07-11 (×14): 237 mL via ORAL

## 2019-07-03 NOTE — ED Notes (Signed)
Pt provided meal tray and set up, pt insisted multiple times he was not hungry and did not want to eat, meal tray left just in case.

## 2019-07-03 NOTE — ED Notes (Addendum)
Pt was provided lower extremety bath, w/ peri-care, new brief applied, new chucks applied, pt repositioned, new linens applied, by NT & EMT. No BM noted.

## 2019-07-03 NOTE — ED Notes (Signed)
Pt meal tray provided, pt stated he was not hungry at this time, pt was advised to place surgical mask on while staff is in the room, pt refused to keep it on and pulled it off.

## 2019-07-03 NOTE — Progress Notes (Signed)
Bilateral lower extremity venous duplex has been completed. Preliminary results can be found in CV Proc through chart review.  Results were given to the patient's nurse, Ronalee Belts.  07/03/19 9:55 AM Timothy Duncan RVT

## 2019-07-03 NOTE — Progress Notes (Signed)
[place] for Eliquis Indication: LE DVT  No Known Allergies  Patient Measurements: Height: 5\' 11"  (180.3 cm) Weight: 200 lb (90.7 kg) IBW/kg (Calculated) : 75.3 Heparin Dosing Weight:   Vital Signs: BP: 103/74 (01/03 1000) Pulse Rate: 90 (01/03 1000)  Labs: Recent Labs    07/02/19 1203 07/02/19 1505 07/02/19 2012 07/03/19 0434  HGB  --  12.6* 13.2 13.9  HCT  --  40.1 41.5 43.7  PLT  --  422* 405* 432*  CREATININE 1.12  --  0.94 0.97    Estimated Creatinine Clearance: 72.4 mL/min (by C-G formula based on SCr of 0.97 mg/dL).   Assessment: Patient's a 79 y.o M with hx DVT/PE (not on anticoag. PTA d/t fall risk) presented to the ED on 07/02/19 with COVID. Chest CTA on 1/2 was negative for PE.  LE doppler on 1/3 showed bilateral age indeterminate DVTs.  Pharmacy is consulted to start Eliquis for VTE.  - last dose of lovenox 40 mg given on 1/2 at 2206 - cbc stable - no bleeding documented - scr 0.97 (crcl 72)   Plan:  - d/c lovenox 40 mg q12h - start Eliquis 10 mg bid x7 days, then 5 mg bid - monitor for s/s bleeding  Mahamud Metts P 07/03/2019,10:40 AM

## 2019-07-03 NOTE — Discharge Instructions (Signed)
Information on my medicine - ELIQUIS (apixaban)  This medication education was reviewed with me or my healthcare representative as part of my discharge preparation.    Why was Eliquis prescribed for you? Eliquis was prescribed to treat blood clots that may have been found in the veins of your legs (deep vein thrombosis) or in your lungs (pulmonary embolism) and to reduce the risk of them occurring again.  What do You need to know about Eliquis ? The starting dose is 10 mg (two 5 mg tablets) taken TWICE daily for the FIRST SEVEN (7) DAYS, then on 07/10/2019  the dose is reduced to ONE 5 mg tablet taken TWICE daily.  Eliquis may be taken with or without food.   Try to take the dose about the same time in the morning and in the evening. If you have difficulty swallowing the tablet whole please discuss with your pharmacist how to take the medication safely.  Take Eliquis exactly as prescribed and DO NOT stop taking Eliquis without talking to the doctor who prescribed the medication.  Stopping may increase your risk of developing a new blood clot.  Refill your prescription before you run out.  After discharge, you should have regular check-up appointments with your healthcare provider that is prescribing your Eliquis.    What do you do if you miss a dose? If a dose of ELIQUIS is not taken at the scheduled time, take it as soon as possible on the same day and twice-daily administration should be resumed. The dose should not be doubled to make up for a missed dose.  Important Safety Information A possible side effect of Eliquis is bleeding. You should call your healthcare provider right away if you experience any of the following: ? Bleeding from an injury or your nose that does not stop. ? Unusual colored urine (red or dark brown) or unusual colored stools (red or black). ? Unusual bruising for unknown reasons. ? A serious fall or if you hit your head (even if there is no  bleeding).  Some medicines may interact with Eliquis and might increase your risk of bleeding or clotting while on Eliquis. To help avoid this, consult your healthcare provider or pharmacist prior to using any new prescription or non-prescription medications, including herbals, vitamins, non-steroidal anti-inflammatory drugs (NSAIDs) and supplements.  This website has more information on Eliquis (apixaban): http://www.eliquis.com/eliquis/home

## 2019-07-03 NOTE — ED Notes (Signed)
Pt insisted he did not want anything to eat, pt meal discarded.

## 2019-07-03 NOTE — Progress Notes (Signed)
PROGRESS NOTE  ZE COLALUCA  DOB: 09-16-1940  PCP: Caren Macadam, MD [id number]  DOA: 07/02/2019 Admitted From: [place]  LOS: 1 day   Chief Complaint  Patient presents with  . Covid pos  . Abnormal Lab   Brief narrative: Timothy Duncan is a 79 y.o. male with PMH of HTN, HLD, CVA, chronic combined systolic and diastolic CHF, dementia, history of DVT off anticoagulation because of frequent falls. Patient presented to the ED on 07/02/2019 from [facility] with complaint of dyspnea, hypoxia and elevated D-dimer 12/24, patient was diagnosed with COVID-19.  1/2 -patient was noted to be hypoxic.  D-dimer was checked and it was noted to be elevated because of which patient was sent to the ED for further evaluation.    In the ED, patient was hemodynamically stable.  Oxygen saturation 96% on 2 L.  Not on oxygen at baseline. Lab work was remarkable for WBC count 9, CRP elevated to 8.9, lactic acid elevated to 3.1, ferritin elevated to >1000.  Procalcitonin less than 0.10.   Chest x-ray with right upper lobe consolidation significant for likely underlying pneumonia.   CTPA negative for pulmonary embolism but with multilobar airspace opacification consistent with multifocal pneumonia.   Given patient's hypoxia, elevated inflammatory markers and underlying dyspnea, and recent diagnosis of COVID-19, patient was admitted to hospitalist medicine service for further evaluation and management.   Subjective: Patient was seen and examined this morning.  Elderly Caucasian male.  Lying down in bed.  Not in distress.  He was alert, awake but not able to have a conversation.  Has dementia at baseline.  Not in physical distress.  Assessment/Plan: COVID pneumonia Acute respiratory failure with hypoxia  -Presented with dyspnea, hypoxia, elevated D-dimer -Chest imaging -CT chest showed multifocal pneumonia. -Treatment: Patient received a dose of Actemra in the ED.  Currently on IV Decadron 6 mg  daily for 10 days, IV Remdesivir for 5 days to complete on 1/6. -Supportive care: Vitamin C, Zinc, inhalers, Tylenol, Antitussives - benzonatate, Mucinex -Oxygen - SpO2: 94 % O2 Flow Rate (L/min): 2 L/min .  Wean down oxygen as tolerated. -Continue airborne/contact isolation precautions. -Labs and biomarker trend as below.  Lab Results  Component Value Date   SARSCOV2NAA POSITIVE (A) 07/02/2019    Recent Labs  Lab 07/02/19 1505 07/02/19 2012 07/03/19 0434  WBC 9.0 9.2 6.7   Recent Labs    07/02/19 1203 07/02/19 1204 07/03/19 0434  DDIMER 11.55*  --  8.19*  FERRITIN  --  1,009*  --   LDH 200*  --   --   CRP  --  8.7*  --    Acute bilateral DVT History of DVT -Patient has a history of DVT and was on anticoagulation which reportedly was stopped because of frequent falls.   -CTPA on this admission is negative for pulmonary embolism -However, lower extremity duplex is positive for acute DVT bilaterally. -Based on documentation by previous hospitalist, family wants aggressive care continued. -I started the patient back on Eliquis.  We will have a conversation with his family again regarding the risk and benefit of Eliquis.   Cardiovascular issues : HTN, HLD, CVA, chronic combined systolic and diastolic CHF -TTE [id number] showed EF of 25 to 30%, LV dilation, grade 1 diastolic dysfunction, diffuse hypokinesis. -Home meds include Coreg, losartan, Aldactone. -Heart failure seems fairly well compensated. -Continue to monitor clinically.   Questionable history of aspiration/dysphagia -Speech therapy evaluation -Aspiration precautions  Weakness/debility: -Patient with history of  multiple falls, recent hospitalization at [facility] from 12/3 through 06/10/2019 for failure to thrive; and subsequently discharged to nursing facility. --PT/OT evaluation --Fall precautions  Mobility: Unclear baseline ambulation status.  PT evaluation might help. Diet: Cardiac  diet Fluid: Not on IV fluid DVT prophylaxis:  Eliquis Code Status:  Full code.  Will discuss with the family to revisit CODE STATUS Family Communication:  Expected Discharge:  Continuing patient care  Consultants:    Procedures:    Antimicrobials: Anti-infectives (From admission, onward)   Start     Dose/Rate Route Frequency Ordered Stop   07/03/19 1000  remdesivir 100 mg in sodium chloride 0.9 % 100 mL IVPB  Status:  Discontinued     100 mg 200 mL/hr over 30 Minutes Intravenous Daily 07/02/19 1951 07/02/19 1953   07/03/19 1000  remdesivir 100 mg in sodium chloride 0.9 % 100 mL IVPB     100 mg 200 mL/hr over 30 Minutes Intravenous Daily 07/02/19 1757 07/07/19 0959   07/02/19 1950  remdesivir 200 mg in sodium chloride 0.9% 250 mL IVPB  Status:  Discontinued     200 mg 580 mL/hr over 30 Minutes Intravenous Once 07/02/19 1951 07/02/19 1953   07/02/19 1830  remdesivir 200 mg in sodium chloride 0.9% 250 mL IVPB     200 mg 580 mL/hr over 30 Minutes Intravenous Once 07/02/19 1757 07/02/19 1933       Code Status: Full Code   Diet Order            Diet Heart Room service appropriate? Yes; Fluid consistency: Thin  Diet effective now              Infusions:  . remdesivir 100 mg in NS 100 mL Stopped (07/03/19 1228)    Scheduled Meds: . apixaban  10 mg Oral BID   Followed by  . [START ON 07/10/2019] apixaban  5 mg Oral BID  . vitamin C  500 mg Oral Daily  . carvedilol  6.25 mg Oral BID WC  . dexamethasone (DECADRON) injection  6 mg Intravenous Q24H  . Ipratropium-Albuterol  1 puff Inhalation Q6H  . losartan  25 mg Oral Daily  . Melatonin  6 mg Oral QHS  . spironolactone  12.5 mg Oral Daily  . zinc sulfate  220 mg Oral Daily    PRN meds: acetaminophen **OR** acetaminophen, bisacodyl, guaiFENesin-dextromethorphan, ondansetron **OR** ondansetron (ZOFRAN) IV, oxyCODONE, senna-docusate   Objective: Vitals:   07/03/19 1634 07/03/19 1709  BP: 128/72 138/77  Pulse: 81 77   Resp: 18 18  Temp:  98 F (36.7 C)  SpO2: 93% 94%    Intake/Output Summary (Last 24 hours) at 07/03/2019 1800 Last data filed at 07/03/2019 1228 Gross per 24 hour  Intake 200 ml  Output --  Net 200 ml   Filed Weights   07/02/19 1127  Weight: 90.7 kg   Weight change:  Body mass index is 27.89 kg/m.   Physical Exam: General exam: Appears calm and comfortable.  Not in physical distress Skin: No rashes, lesions or ulcers. HEENT: Atraumatic, normocephalic, supple neck, no obvious bleeding Lungs: Clear to auscultation bilaterally CVS: Regular rate and rhythm, no murmur GI/Abd soft, nontender, nondistended, bowel sound present CNS: Alert, awake, able to answer few questions only.  Demented at baseline Psychiatry: Depressed look Extremities: No calf tenderness, no pedal edema  Data Review: I have personally reviewed the laboratory data and studies available.  Recent Labs  Lab 07/02/19 1505 07/02/19 2012 07/03/19 [id number]  WBC 9.0 9.2 6.7  NEUTROABS 5.6  --  5.6  HGB 12.6* 13.2 13.9  HCT 40.1 41.5 43.7  MCV 97.1 96.7 97.3  PLT 422* 405* 432*   Recent Labs  Lab 07/02/19 1203 07/02/19 2012 07/03/19 0434  NA 145  --  143  K 4.0  --  4.6  CL 107  --  110  CO2 25  --  23  GLUCOSE 102*  --  146*  BUN 17  --  16  CREATININE 1.12 0.94 0.97  CALCIUM 9.1  --  8.8*    Terrilee Croak, MD  [facility] 07/03/2019

## 2019-07-04 LAB — CBC WITH DIFFERENTIAL/PLATELET
Abs Immature Granulocytes: 0.09 10*3/uL — ABNORMAL HIGH (ref 0.00–0.07)
Basophils Absolute: 0 10*3/uL (ref 0.0–0.1)
Basophils Relative: 0 %
Eosinophils Absolute: 0 10*3/uL (ref 0.0–0.5)
Eosinophils Relative: 0 %
HCT: 40.3 % (ref 39.0–52.0)
Hemoglobin: 12.7 g/dL — ABNORMAL LOW (ref 13.0–17.0)
Immature Granulocytes: 1 %
Lymphocytes Relative: 12 %
Lymphs Abs: 1.1 10*3/uL (ref 0.7–4.0)
MCH: 30.3 pg (ref 26.0–34.0)
MCHC: 31.5 g/dL (ref 30.0–36.0)
MCV: 96.2 fL (ref 80.0–100.0)
Monocytes Absolute: 0.2 10*3/uL (ref 0.1–1.0)
Monocytes Relative: 2 %
Neutro Abs: 7.8 10*3/uL — ABNORMAL HIGH (ref 1.7–7.7)
Neutrophils Relative %: 85 %
Platelets: 512 10*3/uL — ABNORMAL HIGH (ref 150–400)
RBC: 4.19 MIL/uL — ABNORMAL LOW (ref 4.22–5.81)
RDW: 13.3 % (ref 11.5–15.5)
WBC: 9.2 10*3/uL (ref 4.0–10.5)
nRBC: 0 % (ref 0.0–0.2)

## 2019-07-04 LAB — COMPREHENSIVE METABOLIC PANEL
ALT: 34 U/L (ref 0–44)
AST: 27 U/L (ref 15–41)
Albumin: 3.1 g/dL — ABNORMAL LOW (ref 3.5–5.0)
Alkaline Phosphatase: 50 U/L (ref 38–126)
Anion gap: 10 (ref 5–15)
BUN: 24 mg/dL — ABNORMAL HIGH (ref 8–23)
CO2: 20 mmol/L — ABNORMAL LOW (ref 22–32)
Calcium: 9.1 mg/dL (ref 8.9–10.3)
Chloride: 109 mmol/L (ref 98–111)
Creatinine, Ser: 0.98 mg/dL (ref 0.61–1.24)
GFR calc Af Amer: >60 mL/min (ref >60)
GFR calc non Af Amer: >60 mL/min (ref >60)
Glucose, Bld: 124 mg/dL — ABNORMAL HIGH (ref 70–99)
Potassium: 4.4 mmol/L (ref 3.5–5.1)
Sodium: 139 mmol/L (ref 135–145)
Total Bilirubin: 0.7 mg/dL (ref 0.3–1.2)
Total Protein: 7.1 g/dL (ref 6.5–8.1)

## 2019-07-04 LAB — C-REACTIVE PROTEIN: CRP: 2.9 mg/dL — ABNORMAL HIGH (ref <1.0)

## 2019-07-04 LAB — FERRITIN: Ferritin: 914 ng/mL — ABNORMAL HIGH (ref 24–336)

## 2019-07-04 LAB — PROCALCITONIN: Procalcitonin: <0.1 ng/mL

## 2019-07-04 LAB — D-DIMER, QUANTITATIVE: D-Dimer, Quant: 8.44 ug/mL-FEU — ABNORMAL HIGH (ref 0.00–0.50)

## 2019-07-04 LAB — MRSA PCR SCREENING: MRSA by PCR: NEGATIVE

## 2019-07-04 MED ORDER — ADULT MULTIVITAMIN W/MINERALS CH
1.0000 | ORAL_TABLET | Freq: Every day | ORAL | Status: DC
  Administered 2019-07-05 – 2019-07-11 (×7): 1 via ORAL
  Filled 2019-07-04 (×8): qty 1

## 2019-07-04 NOTE — Progress Notes (Signed)
Initial Nutrition Assessment  RD working remotely.   DOCUMENTATION CODES:   Not applicable  INTERVENTION:  - continue Ensure Enlive BID, each supplement provides 350 kcal and 20 grams of protein. - will order Magic Cup BID with meals, each supplement provides 290 kcal and 9 grams of protein. - will order daily multivitamin with minerals. - continue to encourage PO intakes.    NUTRITION DIAGNOSIS:   Increased nutrient needs related to acute illness(COVID-19) as evidenced by estimated needs.  GOAL:   Patient will meet greater than or equal to 90% of their needs  MONITOR:   PO intake, Supplement acceptance, Labs, Weight trends  REASON FOR ASSESSMENT:   Malnutrition Screening Tool  ASSESSMENT:   79 y.o. male with medical history significant of dementia, essential HTN, CHF, hyperlipidemia, history of DVT off anticoagulation secondary to frequent falls, and CVA. He presented to the ED from [facility] with dyspnea, hypoxia, and reportedly elevated D-dimer.  Patient with known positive Covid-19 diagnosed on 12/24.  Flow sheet indicates that patient is a/o to self only and that he consumed 35% of breakfast this AM (227 kcal, 9 grams protein). Ensure Enlive was ordered BID yesterday and so far patient has accepted the one bottle offered to him.  Per chart review, current weight is 188 lb and weight on 07/07/18 was 237 lb. This indicates 49 lb weight loss (21% body weight) in 1 year; significant for time frame. The only other weight recorded in the chart was on 06/10/19 at [facility] when he weighed 180 lb (appears stated weight). Patient at high risk for malnutrition.  Per notes: - COVID-19 PNA - acute respiratory failure with hypoxia - acute BLE DVT - possible hx of aspiration/dysphagia--plan for SLP evaluation - weakness and debility   Labs reviewed; BUN: 24 mg/dl, Medications reviewed; 500 mg ascorbic acid/day, 6 mg melatonin/day, 100 mg remdesivir x1 dose/day (1/3-1/6), 12.5 mg  aldactone/day, 220 mg zinc sulfate/day.      NUTRITION - FOCUSED PHYSICAL EXAM:  unable to complete for COVID+ patient.   Diet Order:   Diet Order            Diet Heart Room service appropriate? Yes; Fluid consistency: Thin  Diet effective now              EDUCATION NEEDS:   No education needs have been identified at this time  Skin:  Skin Assessment: Reviewed RN Assessment  Last BM:  PTA/unknown  Height:   Ht Readings from Last 1 Encounters:  07/02/19 5\' 11"  (1.803 m)    Weight:   Wt Readings from Last 1 Encounters:  07/04/19 85.3 kg    Ideal Body Weight:  78.2 kg  BMI:  Body mass index is 26.23 kg/m.  Estimated Nutritional Needs:   Kcal:  1950-2130 kcal  Protein:  90-100 grams  Fluid:  >/= 2. L/day     Jarome Matin, MS, RD, LDN, [facility] Inpatient Clinical Dietitian Pager # [phone] After hours/weekend pager # [phone]

## 2019-07-04 NOTE — TOC Progression Note (Signed)
Transition of Care [facility]) - Progression Note    Patient Details  Name: Timothy Duncan MRN: [id number] Date of Birth: 1940-07-08  Transition of Care [facility]) CM/SW Contact  Purcell Mouton, RN Phone Number: 07/04/2019, 4:25 PM  Clinical Narrative:     Pt was at [facility] for ST Rehab from 06/10/19 until coming to the hospital on 07/02/19. [facility] do not have any beds available. Admission Coordinator stat that all of pt's clothing have been cleaned and disinfected are in a bag with his  IPAD.Marland Kitchen A call was made to [name] pt's son left VM for him to return call.       Expected Discharge Plan and Services                                                 Social Determinants of Health (SDOH) Interventions    Readmission Risk Interventions No flowsheet data found.

## 2019-07-04 NOTE — Evaluation (Signed)
Occupational Therapy Evaluation Patient Details Name: Timothy Duncan MRN: [id number] DOB: 01/03/1941 Today's Date: 07/04/2019    History of Present Illness Timothy Duncan is a 79 y.o. male with medical history significant of dementia, essential hypertension, chronic systolic congestive heart failure, hyperlipidemia, history of DVT off anticoagulation secondary to frequent falls, CVA who presents from [facility] with dyspnea, hypoxia, and report reportedly elevated D-dimer and found to have acute DVTs. Patient with known positive Covid-19 diagnosed on 06/23/2019.   Clinical Impression   Pt admitted with the above/ COVID. Pt currently with functional limitations due to the deficits listed below (see OT Problem List).  Pt will benefit from skilled OT to increase their safety and independence with ADL and functional mobility for ADL to facilitate discharge to venue listed below.      Follow Up Recommendations  SNF    Equipment Recommendations  None recommended by OT    Recommendations for Other Services       Precautions / Restrictions Precautions Precautions: Fall Restrictions Weight Bearing Restrictions: No      Mobility Bed Mobility Overal bed mobility: Needs Assistance Bed Mobility: Supine to Sit     Supine to sit: Mod assist;HOB elevated     General bed mobility comments: MOD A for legs and then to get trunk upright. tends to want to pull on therapist rather than use bed.  Transfers Overall transfer level: Needs assistance Equipment used: Rolling walker (2 wheeled) Transfers: Sit to/from Stand Sit to Stand: Mod assist;+2 physical assistance;From elevated surface         General transfer comment: MOD A of 2 to power up with multi-modal cueing for hand placement.    Balance Overall balance assessment: Needs assistance Sitting-balance support: Feet supported Sitting balance-Leahy Scale: Fair     Standing balance support: Bilateral upper extremity  supported Standing balance-Leahy Scale: Poor                             ADL either performed or assessed with clinical judgement   ADL Overall ADL's : Needs assistance/impaired Eating/Feeding: Sitting;Minimal assistance   Grooming: Minimal assistance;Sitting   Upper Body Bathing: Moderate assistance;Sitting   Lower Body Bathing: Sit to/from stand;Maximal assistance;+2 for physical assistance;+2 for safety/equipment   Upper Body Dressing : Minimal assistance;Sitting   Lower Body Dressing: Maximal assistance;Sit to/from stand;+2 for physical assistance;+2 for safety/equipment                       Vision Patient Visual Report: No change from baseline              Pertinent Vitals/Pain Pain Assessment: No/denies pain Faces Pain Scale: No hurt     Hand Dominance     Extremity/Trunk Assessment Upper Extremity Assessment Upper Extremity Assessment: Generalized weakness   Lower Extremity Assessment Lower Extremity Assessment: Generalized weakness       Communication     Cognition Arousal/Alertness: Awake/alert Behavior During Therapy: WFL for tasks assessed/performed;Agitated(easily agitated, but overall was pleasant) Overall Cognitive Status: No family/caregiver present to determine baseline cognitive functioning Area of Impairment: Following commands;Safety/judgement;Problem solving                       Following Commands: Follows one step commands with increased time;Follows one step commands inconsistently Safety/Judgement: Decreased awareness of safety   Problem Solving: Slow processing;Decreased initiation;Difficulty sequencing;Requires verbal cues;Requires tactile cues General Comments: hx of dementia  General Comments               Home Living Family/patient expects to be discharged to:: Skilled nursing facility                                 Additional Comments: Was at [facility] for rehab after a  hospitalization in December      Prior Functioning/Environment          Comments: Pt unable to state what he has been doing in rehab.        OT Problem List: Decreased strength;Impaired balance (sitting and/or standing);Decreased activity tolerance;Decreased knowledge of use of DME or AE;Decreased safety awareness      OT Treatment/Interventions: Self-care/ADL training;Patient/family education    OT Goals(Current goals can be found in the care plan section) Acute Rehab OT Goals Patient Stated Goal: none stated OT Goal Formulation: With patient Time For Goal Achievement: 07/11/19 Potential to Achieve Goals: Good  OT Frequency: Min 2X/week   Barriers to D/C: Decreased caregiver support          Co-evaluation   Reason for Co-Treatment: For patient/therapist safety PT goals addressed during session: Mobility/safety with mobility;Balance;Proper use of DME        AM-PAC OT "6 Clicks" Daily Activity     Outcome Measure Help from another person eating meals?: A Little Help from another person taking care of personal grooming?: A Little Help from another person toileting, which includes using toliet, bedpan, or urinal?: Total Help from another person bathing (including washing, rinsing, drying)?: A Lot Help from another person to put on and taking off regular upper body clothing?: A Little Help from another person to put on and taking off regular lower body clothing?: Total 6 Click Score: 13   End of Session Equipment Utilized During Treatment: Rolling walker;Gait belt Nurse Communication: Mobility status  Activity Tolerance: Patient tolerated treatment well Patient left: in bed;with call bell/phone within reach  OT Visit Diagnosis: Unsteadiness on feet (R26.81);Other abnormalities of gait and mobility (R26.89);Muscle weakness (generalized) (M62.81);History of falling (Z91.81)                Time: 0200-0211 OT Time Calculation (min): 11 min Charges:  OT General  Charges $OT Visit: 1 Visit OT Evaluation $OT Eval Moderate Complexity: 1 Mod  Kari Baars, OT Acute Rehabilitation Services [phone] Office- [phone], Edwena Felty D 07/04/2019, 3:09 PM

## 2019-07-04 NOTE — NC FL2 (Signed)
[place] LEVEL OF CARE SCREENING TOOL     IDENTIFICATION  Patient Name: Timothy Duncan Birthdate: 01/21/1941 Sex: male Admission Date (Current Location): 07/02/2019  [facility] and [place] Number:  Herbalist and Address:         Provider Number: [id number]  Attending Physician Name and Address:  Terrilee Croak, MD  Relative Name and Phone Number:       Current Level of Care: Hospital Recommended Level of Care: [place] Prior Approval Number:    Date Approved/Denied:   PASRR Number: [id number]  Discharge Plan: SNF    Current Diagnoses: Patient Active Problem List   Diagnosis Date Noted  . COVID-19 virus infection 07/02/2019  . Elevated LFTs 07/02/2019  . Lactic acidosis 07/02/2019  . History of DVT (deep vein thrombosis) 10/11/2018  . Chronic combined systolic and diastolic CHF (congestive heart failure) ([place]) 10/10/2018  . Hyperlipidemia 10/10/2018  . Hypernatremia 06/15/2018  . Acute metabolic encephalopathy [id number]  . Hypokalemia 06/15/2018  . Rhabdomyolysis 06/15/2018  . Acute hypernatremia 06/15/2018  . Dehydration   . Routine general medical examination at a health care facility 04/28/2017  . Lipids abnormal 04/28/2017  . History of CVA (cerebrovascular accident)   . S/P lumbar laminectomy 05/03/2015  . Actinic keratosis 02/04/2011  . Backache 09/03/2009  . ERECTILE DYSFUNCTION 03/24/2008  . Essential hypertension 03/24/2008    Orientation RESPIRATION BLADDER Height & Weight     Self  O2(O2 3L [place]) Incontinent, External catheter Weight: 85.3 kg Height:  5\' 11"  (180.3 cm)  BEHAVIORAL SYMPTOMS/MOOD NEUROLOGICAL BOWEL NUTRITION STATUS      Incontinent Diet(Regular)  AMBULATORY STATUS COMMUNICATION OF NEEDS Skin   Extensive Assist Verbally (Moisture perineum, buttock)                       Personal Care Assistance Level of Assistance  Bathing, Feeding, Dressing Bathing Assistance: Maximum  assistance Feeding assistance: Independent Dressing Assistance: Limited assistance     Functional Limitations Info  Sight, Hearing, Speech Sight Info: Adequate Hearing Info: Adequate Speech Info: Adequate    SPECIAL CARE FACTORS FREQUENCY  PT (By licensed PT), OT (By licensed OT)     PT Frequency: Eval and treat OT Frequency: Eval and treat            Contractures Contractures Info: Not present    Additional Factors Info  Code Status Code Status Info: FULL             Current Medications (07/04/2019):  This is the current hospital active medication list Current Facility-Administered Medications  Medication Dose Route Frequency Provider Last Rate Last Admin  . acetaminophen (TYLENOL) tablet 650 mg  650 mg Oral Q6H PRN [place] Donnamarie Poag, DO       Or  . acetaminophen (TYLENOL) suppository 650 mg  650 mg Rectal Q6H PRN [place] Eric J, DO      . apixaban (ELIQUIS) tablet 10 mg  10 mg Oral BID Lynelle Doctor, RPH   10 mg at 07/04/19 0851   Followed by  . [START ON 07/10/2019] apixaban (ELIQUIS) tablet 5 mg  5 mg Oral BID Lynelle Doctor, RPH      . ascorbic acid (VITAMIN C) tablet 500 mg  500 mg Oral Daily [place] Donnamarie Poag, DO   500 mg at 07/04/19 0850  . bisacodyl (DULCOLAX) EC tablet 5 mg  5 mg Oral Daily PRN [place] Donnamarie Poag, DO      . carvedilol (  COREG) tablet 6.25 mg  6.25 mg Oral BID WC [place] Donnamarie Poag, DO   6.25 mg at 07/04/19 1600  . dexamethasone (DECADRON) injection 6 mg  6 mg Intravenous Q24H [place] Eric J, DO   6 mg at 07/03/19 2127  . feeding supplement (ENSURE ENLIVE) (ENSURE ENLIVE) liquid 237 mL  237 mL Oral BID BM Dahal, Binaya, MD   237 mL at 07/04/19 1600  . guaiFENesin-dextromethorphan (ROBITUSSIN DM) 100-10 MG/5ML syrup 10 mL  10 mL Oral Q4H PRN [place] Donnamarie Poag, DO      . Ipratropium-Albuterol (COMBIVENT) respimat 1 puff  1 puff Inhalation Q6H [place] Donnamarie Poag, DO   1 puff at 07/04/19 1601  . losartan (COZAAR) tablet 25 mg  25 mg Oral Daily [place] Donnamarie Poag, DO   25  mg at 07/04/19 0850  . Melatonin TABS 6 mg  6 mg Oral QHS [place] Donnamarie Poag, DO   6 mg at 07/03/19 2126  . multivitamin with minerals tablet 1 tablet  1 tablet Oral Daily Dahal, Binaya, MD      . ondansetron (ZOFRAN) tablet 4 mg  4 mg Oral Q6H PRN [place] Eric J, DO       Or  . ondansetron [facility]) injection 4 mg  4 mg Intravenous Q6H PRN [place] Eric J, DO      . oxyCODONE (Oxy IR/ROXICODONE) immediate release tablet 5 mg  5 mg Oral Q6H PRN [place] Eric J, DO      . remdesivir 100 mg in sodium chloride 0.9 % 100 mL IVPB  100 mg Intravenous Daily Pham, Anh P, RPH 200 mL/hr at 07/04/19 0932 100 mg at 07/04/19 0932  . senna-docusate (Senokot-S) tablet 1 tablet  1 tablet Oral QHS PRN [place] Donnamarie Poag, DO      . spironolactone (ALDACTONE) tablet 12.5 mg  12.5 mg Oral Daily [place] Donnamarie Poag, DO   12.5 mg at 07/04/19 0850  . zinc sulfate capsule 220 mg  220 mg Oral Daily [place] Eric J, DO   220 mg at 07/04/19 [id number]     Discharge Medications: Please see discharge summary for a list of discharge medications.  Relevant Imaging Results:  Relevant Lab Results:   Additional Information [id number]  Purcell Mouton, RN

## 2019-07-04 NOTE — Evaluation (Signed)
Physical Therapy Evaluation Patient Details Name: Timothy Duncan MRN: [id number] DOB: 1941/02/12 Today's Date: 07/04/2019   History of Present Illness  Timothy Duncan is a 79 y.o. male with medical history significant of dementia, essential hypertension, chronic systolic congestive heart failure, hyperlipidemia, history of DVT off anticoagulation secondary to frequent falls, CVA who presents from [facility] with dyspnea, hypoxia, and report reportedly elevated D-dimer and found to have acute DVTs. Patient with known positive Covid-19 diagnosed on 06/23/2019.  Clinical Impression  Pt admitted with above diagnosis.  Pt currently with functional limitations due to the deficits listed below (see PT Problem List). Pt will benefit from skilled PT to increase their independence and safety with mobility to allow discharge to the venue listed below.  Pt requires +2 for safe mobility with gait and transfers.  Recommend returning to SNF for continued rehab.     Follow Up Recommendations SNF    Equipment Recommendations  None recommended by PT    Recommendations for Other Services       Precautions / Restrictions Precautions Precautions: Fall Restrictions Weight Bearing Restrictions: No      Mobility  Bed Mobility Overal bed mobility: Needs Assistance Bed Mobility: Supine to Sit     Supine to sit: Mod assist;HOB elevated     General bed mobility comments: MOD A for legs and then to get trunk upright. tends to want to pull on therapist rather than use bed.  Transfers Overall transfer level: Needs assistance Equipment used: Rolling walker (2 wheeled) Transfers: Sit to/from Stand Sit to Stand: Mod assist;+2 physical assistance;From elevated surface         General transfer comment: MOD A of 2 to power up with multi-modal cueing for hand placement.  Ambulation/Gait Ambulation/Gait assistance: Mod assist;+2 physical assistance Gait Distance (Feet): 5 Feet Assistive device: Rolling  walker (2 wheeled) Gait Pattern/deviations: Step-to pattern     General Gait Details: Side-stepping in front of bed with decreased foot clearance.  Stairs            Wheelchair Mobility    Modified Rankin (Stroke Patients Only)       Balance Overall balance assessment: Needs assistance Sitting-balance support: Feet supported Sitting balance-Leahy Scale: Fair     Standing balance support: Bilateral upper extremity supported Standing balance-Leahy Scale: Poor                               Pertinent Vitals/Pain Pain Assessment: Faces Faces Pain Scale: No hurt    Home Living Family/patient expects to be discharged to:: Skilled nursing facility                 Additional Comments: Was at [facility] for rehab after a hospitalization in December    Prior Function           Comments: Pt unable to state what he has been doing in rehab.     Hand Dominance        Extremity/Trunk Assessment   Upper Extremity Assessment Upper Extremity Assessment: Defer to OT evaluation    Lower Extremity Assessment Lower Extremity Assessment: Generalized weakness       Communication      Cognition Arousal/Alertness: Awake/alert Behavior During Therapy: WFL for tasks assessed/performed;Agitated(easily agitated, but overall was pleasant) Overall Cognitive Status: No family/caregiver present to determine baseline cognitive functioning Area of Impairment: Following commands;Safety/judgement;Problem solving  Following Commands: Follows one step commands with increased time;Follows one step commands inconsistently Safety/Judgement: Decreased awareness of safety   Problem Solving: Slow processing;Decreased initiation;Difficulty sequencing;Requires verbal cues;Requires tactile cues General Comments: hx of dementia      General Comments      Exercises     Assessment/Plan    PT Assessment Patient needs continued PT  services  PT Problem List Decreased strength;Decreased activity tolerance;Decreased balance;Decreased mobility;Decreased knowledge of use of DME;Decreased safety awareness;Decreased knowledge of precautions       PT Treatment Interventions Gait training;Functional mobility training;DME instruction;Therapeutic activities;Therapeutic exercise;Balance training;Neuromuscular re-education    PT Goals (Current goals can be found in the Care Plan section)  Acute Rehab PT Goals Patient Stated Goal: none stated PT Goal Formulation: Patient unable to participate in goal setting Time For Goal Achievement: 07/18/19 Potential to Achieve Goals: Fair    Frequency Min 2X/week   Barriers to discharge        Co-evaluation PT/OT/SLP Co-Evaluation/Treatment: Yes Reason for Co-Treatment: For patient/therapist safety PT goals addressed during session: Mobility/safety with mobility;Balance;Proper use of DME         AM-PAC PT "6 Clicks" Mobility  Outcome Measure Help needed turning from your back to your side while in a flat bed without using bedrails?: A Lot Help needed moving from lying on your back to sitting on the side of a flat bed without using bedrails?: A Lot Help needed moving to and from a bed to a chair (including a wheelchair)?: A Lot Help needed standing up from a chair using your arms (e.g., wheelchair or bedside chair)?: A Lot Help needed to walk in hospital room?: A Lot Help needed climbing 3-5 steps with a railing? : Total 6 Click Score: 11    End of Session Equipment Utilized During Treatment: Gait belt Activity Tolerance: Patient tolerated treatment well Patient left: in bed;with call bell/phone within reach;with bed alarm set Nurse Communication: Mobility status PT Visit Diagnosis: Unsteadiness on feet (R26.81);Other abnormalities of gait and mobility (R26.89);Muscle weakness (generalized) (M62.81)    Time: [id number] PT Time Calculation (min) (ACUTE ONLY): 26  min   Charges:   PT Evaluation $PT Eval Moderate Complexity: 1 Mod          Shawniece Oyola L. Tamala Julian, [place] Pager [phone] 07/04/2019   Galen Manila 07/04/2019, 2:25 PM

## 2019-07-04 NOTE — Evaluation (Signed)
Clinical/Bedside Swallow Evaluation Patient Details  Name: Timothy Duncan MRN: [id number] Date of Birth: 05-Sep-1940  Today's Date: 07/04/2019 Time: SLP Start Time (ACUTE ONLY): [id number] SLP Stop Time (ACUTE ONLY): 1435 SLP Time Calculation (min) (ACUTE ONLY): 20 min  Past Medical History:  Past Medical History:  Diagnosis Date  . DVT (deep venous thrombosis) ([place]) 07/2015   RLE  . ED (erectile dysfunction)   . Hypertension   . Pulmonary embolism ([place])   . TIA (transient ischemic attack)    Past Surgical History:  Past Surgical History:  Procedure Laterality Date  . APPENDECTOMY    . CATARACT EXTRACTION W/ INTRAOCULAR LENS  IMPLANT, BILATERAL Bilateral   . COLONOSCOPY    . LUMBAR LAMINECTOMY/DECOMPRESSION MICRODISCECTOMY Right 05/03/2015   Procedure: Microdiscectomy - Lumbar four-Lumbar five - right ;  Surgeon: Eustace Moore, MD;  Location: [facility]URO ORS;  Service: Neurosurgery;  Laterality: Right;  . TONSILLECTOMY    . WOUND EXPLORATION N/A 06/06/2015   Procedure: Presumed discitis, Lumbar exploration,Repeat discectomy Lumbar four-five with deep tissue culture;  Surgeon: Eustace Moore, MD;  Location: [facility] NEURO ORS;  Service: Neurosurgery;  Laterality: N/A;   HPI:  79yo male admitted 07/02/19 with dyspnea, elevated ddimer, +Covid 12/24. PMH: dementia, HTN, CHF, HLD, DVT, CVA, PE. CXR = New right upper lobe consolidation suspicious for pneumonia.   Assessment / Plan / Recommendation Clinical Impression  Pt was seen at bedside for assessment of swallow function and safety. Orders for BSE indicate "Concern about aspiration in review of notes previous hospitalization at [facility]", however, no previous speech therapy intervention was found. Pt presents with adequate oral motor strength and function. Pt does not report difficulty swallowing, however, he has a history of dementia at baseline. Pt accepted trials of thin liquid, puree, and solid textures. No obvious oral issues or overt s/s aspiration  observed on any consistency. Recommend continuing with current diet (reg/thin). SLP will follow briefly to assess diet tolerance and to determine if further instrumental workup is needed.    SLP Visit Diagnosis: Dysphagia, unspecified (R13.10)    Aspiration Risk  Mild aspiration risk    Diet Recommendation Regular;Thin liquid   Liquid Administration via: Straw;Cup Medication Administration: Whole meds with liquid Supervision: Patient able to self feed;Staff to assist with self feeding Compensations: Slow rate;Small sips/bites Postural Changes: Seated upright at 90 degrees    Other  Recommendations Oral Care Recommendations: Oral care BID   Follow up Recommendations Other (comment)(TBD)      Frequency and Duration min 1 x/week  2 weeks;1 week       Prognosis Prognosis for Safe Diet Advancement: Good Barriers to Reach Goals: Cognitive deficits      Swallow Study   General Date of Onset: 07/04/19 HPI: 79yo male admitted 07/02/19 with dyspnea, elevated ddimer, +Covid 12/24. PMH: dementia, HTN, CHF, HLD, DVT, CVA, PE. CXR = New right upper lobe consolidation suspicious for pneumonia. Type of Study: Bedside Swallow Evaluation Previous Swallow Assessment: none Diet Prior to this Study: Regular;Thin liquids Temperature Spikes Noted: No Respiratory Status: Nasal cannula History of Recent Intubation: No Behavior/Cognition: Alert;Cooperative Oral Cavity Assessment: Within Functional Limits Oral Care Completed by SLP: No Oral Cavity - Dentition: Adequate natural dentition Vision: Functional for self-feeding Self-Feeding Abilities: Able to feed self Baseline Vocal Quality: Low vocal intensity Volitional Cough: Weak Volitional Swallow: Able to elicit    Oral/Motor/Sensory Function Overall Oral Motor/Sensory Function: Within functional limits   Ice Chips Ice chips: Not tested   Thin Liquid Thin  Liquid: Within functional limits Presentation: Straw    Nectar Thick Nectar Thick  Liquid: Not tested   Honey Thick Honey Thick Liquid: Not tested   Puree Puree: Within functional limits Presentation: Spoon   Solid     Solid: Within functional limits Presentation: Crisoforo Oxford, [facility], [place] Pathologist Office: [phone] Pager: [phone]  Shonna Chock 07/04/2019,2:49 PM

## 2019-07-04 NOTE — Progress Notes (Signed)
PROGRESS NOTE  Timothy Duncan  DOB: [birth year]  PCP: Caren Macadam, MD [id number]  DOA: 07/02/2019 Admitted From: [place]  LOS: 2 days   Chief Complaint  Patient presents with  . Covid pos  . Abnormal Lab   Brief narrative: Timothy Duncan is a 79 y.o. male with PMH of HTN, HLD, CVA, chronic combined systolic and diastolic CHF, dementia, history of DVT off anticoagulation because of frequent falls. Patient presented to the ED on 07/02/2019 from [facility] with complaint of dyspnea, hypoxia and elevated D-dimer 12/24, patient was diagnosed with COVID-19.  1/2 -patient was noted to be hypoxic.  D-dimer was checked and it was noted to be elevated because of which patient was sent to the ED for further evaluation.    In the ED, patient was hemodynamically stable.  Oxygen saturation 96% on 2 L.  Not on oxygen at baseline. Lab work was remarkable for WBC count 9, CRP elevated to 8.9, lactic acid elevated to 3.1, ferritin elevated to >1000.  Procalcitonin less than 0.10.   Chest x-ray with right upper lobe consolidation significant for likely underlying pneumonia.   CTPA negative for pulmonary embolism but with multilobar airspace opacification consistent with multifocal pneumonia.   Given patient's hypoxia, elevated inflammatory markers and underlying dyspnea, and recent diagnosis of COVID-19, patient was admitted to hospitalist medicine service for further evaluation and management.   Subjective: Patient was seen and examined this morning.  Elderly Caucasian male.   Propped up in bed.  Alert, awake. Able to tell his date of birth.  Knows that he is in the hospital.  Not oriented to time or person. Has dementia at baseline.  Not in physical distress.  Assessment/Plan: COVID pneumonia Acute respiratory failure with hypoxia  -Presented with dyspnea, hypoxia, elevated D-dimer -Chest imaging -CT chest showed multifocal pneumonia. -Treatment: Patient received a dose of Actemra in  the ED.  Currently on IV Decadron 6 mg daily for 10 days, IV Remdesivir for 5 days to complete on 1/6. -Supportive care: Vitamin C, Zinc, inhalers, Tylenol, Antitussives - benzonatate, Mucinex -Oxygen - SpO2: 94 % O2 Flow Rate (L/min): 3 L/min . Wean down oxygen as tolerated. -Continue airborne/contact isolation precautions. -Labs and biomarker trend as below.  Gradually trending down.  Lab Results  Component Value Date   SARSCOV2NAA POSITIVE (A) 07/02/2019    Recent Labs  Lab 07/02/19 1505 07/02/19 2012 07/03/19 0434 07/04/19 0401  WBC 9.0 9.2 6.7 9.2   Recent Labs    07/02/19 1203 07/02/19 1204 07/03/19 0434 07/04/19 0401  DDIMER 11.55*  --  8.19* 8.44*  FERRITIN  --  1,009*  --  914*  LDH 200*  --   --   --   CRP  --  8.7*  --  2.9*   Acute bilateral DVT History of DVT -Patient has a history of DVT and was on anticoagulation which reportedly was stopped because of frequent falls.   -CTPA on this admission was negative for pulmonary embolism -However, lower extremity duplex is positive for acute DVT bilaterally. -Based on documentation by previous hospitalist, family wants aggressive care continued.  -I started the patient back on Eliquis.  We need to have a conversation with his family again regarding the risk and benefit of Eliquis. I tried calling patient's son and daughter both in given numbers, but unable to reach any.  Left a message.  Cardiovascular issues : HTN, HLD, CVA, chronic combined systolic and diastolic CHF -TTE [id number] showed EF of  25 to 30%, LV dilation, grade 1 diastolic dysfunction, diffuse hypokinesis. -Home meds include Coreg, losartan, Aldactone. -Heart failure seems fairly well compensated. -Continue to monitor clinically.   Questionable history of aspiration/dysphagia -Speech therapy evaluation obtained. -Aspiration precautions  Weakness/debility: -Patient with history of multiple falls, recent hospitalization at [facility] health  from 12/3 through 06/10/2019 for failure to thrive; and subsequently discharged to nursing facility. -PT/OT evaluation obtained.  SNF recommended. -Fall precautions  Mobility: PT eval obtained.  SNF recommended. Diet: Cardiac diet Fluid: Not on IV fluid DVT prophylaxis:  Eliquis Code Status:  Full code.  We need to discuss with the family to revisit CODE STATUS Family Communication: I tried calling patient's son and daughter both in given numbers, but unable to reach any.  Left a message. Expected Discharge:  Continuing patient care  Consultants:    Procedures:    Antimicrobials: Anti-infectives (From admission, onward)   Start     Dose/Rate Route Frequency Ordered Stop   07/03/19 1000  remdesivir 100 mg in sodium chloride 0.9 % 100 mL IVPB  Status:  Discontinued     100 mg 200 mL/hr over 30 Minutes Intravenous Daily 07/02/19 1951 07/02/19 1953   07/03/19 1000  remdesivir 100 mg in sodium chloride 0.9 % 100 mL IVPB     100 mg 200 mL/hr over 30 Minutes Intravenous Daily 07/02/19 1757 07/07/19 0959   07/02/19 1950  remdesivir 200 mg in sodium chloride 0.9% 250 mL IVPB  Status:  Discontinued     200 mg 580 mL/hr over 30 Minutes Intravenous Once 07/02/19 1951 07/02/19 1953   07/02/19 1830  remdesivir 200 mg in sodium chloride 0.9% 250 mL IVPB     200 mg 580 mL/hr over 30 Minutes Intravenous Once 07/02/19 1757 07/02/19 1933       Code Status: Full Code   Diet Order            Diet Heart Room service appropriate? Yes; Fluid consistency: Thin  Diet effective now              Infusions:  . remdesivir 100 mg in NS 100 mL 100 mg (07/04/19 0932)    Scheduled Meds: . apixaban  10 mg Oral BID   Followed by  . [START ON 07/10/2019] apixaban  5 mg Oral BID  . vitamin C  500 mg Oral Daily  . carvedilol  6.25 mg Oral BID WC  . dexamethasone (DECADRON) injection  6 mg Intravenous Q24H  . feeding supplement (ENSURE ENLIVE)  237 mL Oral BID BM  . Ipratropium-Albuterol  1 puff  Inhalation Q6H  . losartan  25 mg Oral Daily  . Melatonin  6 mg Oral QHS  . multivitamin with minerals  1 tablet Oral Daily  . spironolactone  12.5 mg Oral Daily  . zinc sulfate  220 mg Oral Daily    PRN meds: acetaminophen **OR** acetaminophen, bisacodyl, guaiFENesin-dextromethorphan, ondansetron **OR** ondansetron (ZOFRAN) IV, oxyCODONE, senna-docusate   Objective: Vitals:   07/04/19 0455 07/04/19 0457  BP:  124/76  Pulse: 67 75  Resp:  18  Temp:  (!) 97.5 F (36.4 C)  SpO2: 94%     Intake/Output Summary (Last 24 hours) at 07/04/2019 1442 Last data filed at 07/04/2019 0907 Gross per 24 hour  Intake 980 ml  Output --  Net 980 ml   Filed Weights   07/02/19 1127 07/04/19 0457  Weight: 90.7 kg 85.3 kg   Weight change: -5.419 kg Body mass index is 26.23  kg/m.   Physical Exam: General exam: Appears calm and comfortable.  Not in physical distress Skin: No rashes, lesions or ulcers. HEENT: Atraumatic, normocephalic, supple neck, no obvious bleeding Lungs: Clear to auscultation bilaterally CVS: Regular rate and rhythm, no murmur GI/Abd soft, nontender, nondistended, bowel sound present CNS: Alert, awake, able to answer few questions only.  Demented at baseline Psychiatry: Depressed look. Extremities: No calf tenderness, no pedal edema  Data Review: I have personally reviewed the laboratory data and studies available.  Recent Labs  Lab 07/02/19 1505 07/02/19 2012 07/03/19 0434 07/04/19 0401  WBC 9.0 9.2 6.7 9.2  NEUTROABS 5.6  --  5.6 7.8*  HGB 12.6* 13.2 13.9 12.7*  HCT 40.1 41.5 43.7 40.3  MCV 97.1 96.7 97.3 96.2  PLT 422* 405* 432* 512*   Recent Labs  Lab 07/02/19 1203 07/02/19 2012 07/03/19 0434 07/04/19 0401  NA 145  --  143 139  K 4.0  --  4.6 4.4  CL 107  --  110 109  CO2 25  --  23 20*  GLUCOSE 102*  --  146* 124*  BUN 17  --  16 24*  CREATININE 1.12 0.94 0.97 0.98  CALCIUM 9.1  --  8.8* 9.1    Terrilee Croak, MD  [facility] 07/04/2019

## 2019-07-05 LAB — COMPREHENSIVE METABOLIC PANEL
ALT: 42 U/L (ref 0–44)
AST: 37 U/L (ref 15–41)
Albumin: 3.1 g/dL — ABNORMAL LOW (ref 3.5–5.0)
Alkaline Phosphatase: 52 U/L (ref 38–126)
Anion gap: 9 (ref 5–15)
BUN: 28 mg/dL — ABNORMAL HIGH (ref 8–23)
CO2: 22 mmol/L (ref 22–32)
Calcium: 9 mg/dL (ref 8.9–10.3)
Chloride: 106 mmol/L (ref 98–111)
Creatinine, Ser: 0.89 mg/dL (ref 0.61–1.24)
GFR calc Af Amer: >60 mL/min (ref >60)
GFR calc non Af Amer: >60 mL/min (ref >60)
Glucose, Bld: 132 mg/dL — ABNORMAL HIGH (ref 70–99)
Potassium: 4.4 mmol/L (ref 3.5–5.1)
Sodium: 137 mmol/L (ref 135–145)
Total Bilirubin: 0.8 mg/dL (ref 0.3–1.2)
Total Protein: 6.9 g/dL (ref 6.5–8.1)

## 2019-07-05 LAB — CBC WITH DIFFERENTIAL/PLATELET
Abs Immature Granulocytes: 0.05 10*3/uL (ref 0.00–0.07)
Basophils Absolute: 0 10*3/uL (ref 0.0–0.1)
Basophils Relative: 0 %
Eosinophils Absolute: 0 10*3/uL (ref 0.0–0.5)
Eosinophils Relative: 0 %
HCT: 41.5 % (ref 39.0–52.0)
Hemoglobin: 13.3 g/dL (ref 13.0–17.0)
Immature Granulocytes: 1 %
Lymphocytes Relative: 15 %
Lymphs Abs: 1.4 10*3/uL (ref 0.7–4.0)
MCH: 30.6 pg (ref 26.0–34.0)
MCHC: 32 g/dL (ref 30.0–36.0)
MCV: 95.6 fL (ref 80.0–100.0)
Monocytes Absolute: 0.4 10*3/uL (ref 0.1–1.0)
Monocytes Relative: 4 %
Neutro Abs: 7.1 10*3/uL (ref 1.7–7.7)
Neutrophils Relative %: 80 %
Platelets: 455 10*3/uL — ABNORMAL HIGH (ref 150–400)
RBC: 4.34 MIL/uL (ref 4.22–5.81)
RDW: 13.2 % (ref 11.5–15.5)
WBC: 8.9 10*3/uL (ref 4.0–10.5)
nRBC: 0 % (ref 0.0–0.2)

## 2019-07-05 LAB — D-DIMER, QUANTITATIVE: D-Dimer, Quant: 3.74 ug/mL-FEU — ABNORMAL HIGH (ref 0.00–0.50)

## 2019-07-05 LAB — FERRITIN: Ferritin: 757 ng/mL — ABNORMAL HIGH (ref 24–336)

## 2019-07-05 LAB — C-REACTIVE PROTEIN: CRP: 1.4 mg/dL — ABNORMAL HIGH (ref <1.0)

## 2019-07-05 NOTE — Progress Notes (Addendum)
PROGRESS NOTE  Timothy Duncan  DOB: [birth year]  PCP: Caren Macadam, MD [id number]  DOA: 07/02/2019 Admitted From: [facility]  LOS: 3 days   Chief Complaint  Patient presents with  . Covid pos  . Abnormal Lab   Brief narrative: Timothy Duncan is a 79 y.o. male with PMH of HTN, HLD, CVA, chronic combined systolic and diastolic CHF, dementia, history of DVT off anticoagulation because of frequent falls. Patient presented to the ED on 07/02/2019 from [facility] with complaint of dyspnea, hypoxia and elevated D-dimer 12/24, patient was diagnosed with COVID-19.  1/2 -patient was noted to be hypoxic.  D-dimer was checked and it was noted to be elevated because of which patient was sent to the ED for further evaluation.    In the ED, patient was hemodynamically stable.  Oxygen saturation 96% on 2 L.  Not on oxygen at baseline. Lab work was remarkable for WBC count 9, CRP elevated to 8.9, lactic acid elevated to 3.1, ferritin elevated to >1000.  Procalcitonin less than 0.10.   Chest x-ray with right upper lobe consolidation significant for likely underlying pneumonia.   CTPA negative for pulmonary embolism but with multilobar airspace opacification consistent with multifocal pneumonia.   Given patient's hypoxia, elevated inflammatory markers and underlying dyspnea, and recent diagnosis of COVID-19, patient was admitted to hospitalist medicine service for further evaluation and management.   Subjective: Patient was seen and examined this morning.  Elderly Caucasian male.   Alert, awake, confused, demented at baseline.  Slightly restless today.  Assessment/Plan: COVID pneumonia Acute respiratory failure with hypoxia  -Presented with dyspnea, hypoxia, elevated D-dimer -Chest imaging -CT chest showed multifocal pneumonia. -Treatment: Patient received a dose of Actemra in the ED.  Currently on IV Decadron 6 mg daily for 10 days, IV Remdesivir for 5 days to complete on 1/6. -Supportive  care: Vitamin C, Zinc, inhalers, Tylenol, Antitussives - benzonatate, Mucinex -Oxygen - SpO2: (!) 87 % O2 Flow Rate (L/min): 2 L/min . Wean down oxygen as tolerated. -Continue airborne/contact isolation precautions. -Labs and biomarker trend as below.  Gradually trending down.  Lab Results  Component Value Date   SARSCOV2NAA POSITIVE (A) 07/02/2019    Recent Labs  Lab 07/02/19 1505 07/02/19 2012 07/03/19 0434 07/04/19 0401 07/05/19 0426  WBC 9.0 9.2 6.7 9.2 8.9   Recent Labs    07/03/19 0434 07/04/19 0401 07/05/19 0426  DDIMER 8.19* 8.44* 3.74*  FERRITIN  --  914* 757*  CRP  --  2.9* 1.4*   Acute bilateral DVT History of DVT -Patient has a history of DVT and was on anticoagulation which reportedly was stopped because of frequent falls.   -CTPA on this admission was negative for pulmonary embolism -However, lower extremity duplex is positive for acute DVT bilaterally. -Based on documentation by previous hospitalist, family wants aggressive care continued.  -I started the patient back on Eliquis.  We need to have a conversation with his family again regarding the risk and benefit of Eliquis. I tried calling patient's son and daughter both in given numbers, but unable to reach any.  Left a message.  Cardiovascular issues : HTN, HLD, CVA, chronic combined systolic and diastolic CHF -TTE [id number] showed EF of 25 to 30%, LV dilation, grade 1 diastolic dysfunction, diffuse hypokinesis. -Home meds include Coreg, losartan, Aldactone. -Heart failure seems fairly well compensated. -Continue to monitor clinically.   Questionable history of aspiration/dysphagia -Speech therapy evaluation obtained. -Aspiration precautions  Weakness/debility: -Patient with history of multiple falls,  recent hospitalization at [facility] from 12/3 through 06/10/2019 for failure to thrive; and subsequently discharged to nursing facility. -PT/OT evaluation obtained.  SNF recommended.  -Fall precautions  Mobility: PT eval obtained.  SNF recommended. Diet: Cardiac diet Fluid: Not on IV fluid DVT prophylaxis:  Eliquis Code Status:  Full code.  I reconfirmed that with patient's son today.   Family Communication:  I called and updated patient's son [name] this afternoon. Expected Discharge:  Continuing patient care  Consultants:    Procedures:    Antimicrobials: Anti-infectives (From admission, onward)   Start     Dose/Rate Route Frequency Ordered Stop   07/03/19 1000  remdesivir 100 mg in sodium chloride 0.9 % 100 mL IVPB  Status:  Discontinued     100 mg 200 mL/hr over 30 Minutes Intravenous Daily 07/02/19 1951 07/02/19 1953   07/03/19 1000  remdesivir 100 mg in sodium chloride 0.9 % 100 mL IVPB     100 mg 200 mL/hr over 30 Minutes Intravenous Daily 07/02/19 1757 07/07/19 0959   07/02/19 1950  remdesivir 200 mg in sodium chloride 0.9% 250 mL IVPB  Status:  Discontinued     200 mg 580 mL/hr over 30 Minutes Intravenous Once 07/02/19 1951 07/02/19 1953   07/02/19 1830  remdesivir 200 mg in sodium chloride 0.9% 250 mL IVPB     200 mg 580 mL/hr over 30 Minutes Intravenous Once 07/02/19 1757 07/02/19 1933       Code Status: Full Code   Diet Order            Diet Heart Room service appropriate? Yes; Fluid consistency: Thin  Diet effective now              Infusions:  . remdesivir 100 mg in NS 100 mL 100 mg (07/04/19 0932)    Scheduled Meds: . apixaban  10 mg Oral BID   Followed by  . [START ON 07/10/2019] apixaban  5 mg Oral BID  . vitamin C  500 mg Oral Daily  . carvedilol  6.25 mg Oral BID WC  . dexamethasone (DECADRON) injection  6 mg Intravenous Q24H  . feeding supplement (ENSURE ENLIVE)  237 mL Oral BID BM  . Ipratropium-Albuterol  1 puff Inhalation Q6H  . losartan  25 mg Oral Daily  . Melatonin  6 mg Oral QHS  . multivitamin with minerals  1 tablet Oral Daily  . spironolactone  12.5 mg Oral Daily  . zinc sulfate  220 mg Oral  Daily    PRN meds: acetaminophen **OR** acetaminophen, bisacodyl, guaiFENesin-dextromethorphan, ondansetron **OR** ondansetron (ZOFRAN) IV, oxyCODONE, senna-docusate   Objective: Vitals:   07/05/19 0500 07/05/19 1123  BP: 138/73 128/82  Pulse: 60 97  Resp: (!) 22 18  Temp: (!) 97.5 F (36.4 C) 97.6 F (36.4 C)  SpO2: (!) 89% (!) 87%    Intake/Output Summary (Last 24 hours) at 07/05/2019 1605 Last data filed at 07/05/2019 1451 Gross per 24 hour  Intake 240 ml  Output 2950 ml  Net -2710 ml   Filed Weights   07/02/19 1127 07/04/19 0457 07/05/19 0448  Weight: 90.7 kg 85.3 kg 85.1 kg   Weight change: -0.205 kg Body mass index is 26.16 kg/m.   Physical Exam: General exam: Appears calm and comfortable.  Not in physical distress Skin: No rashes, lesions or ulcers. HEENT: Atraumatic, normocephalic, supple neck, no obvious bleeding Lungs: Clear to auscultation bilaterally. CVS: Regular rate and rhythm, no murmur GI/Abd soft, nontender, nondistended,  bowel sound present CNS: Alert, awake, able to answer few questions only.  Demented at baseline.  Able to follow motor commands. Psychiatry: Depressed look. Extremities: No calf tenderness, no pedal edema  Data Review: I have personally reviewed the laboratory data and studies available.  Recent Labs  Lab 07/02/19 1505 07/02/19 2012 07/03/19 0434 07/04/19 0401 07/05/19 0426  WBC 9.0 9.2 6.7 9.2 8.9  NEUTROABS 5.6  --  5.6 7.8* 7.1  HGB 12.6* 13.2 13.9 12.7* 13.3  HCT 40.1 41.5 43.7 40.3 41.5  MCV 97.1 96.7 97.3 96.2 95.6  PLT 422* 405* 432* 512* 455*   Recent Labs  Lab 07/02/19 1203 07/02/19 2012 07/03/19 0434 07/04/19 0401 07/05/19 0426  NA 145  --  143 139 137  K 4.0  --  4.6 4.4 4.4  CL 107  --  110 109 106  CO2 25  --  23 20* 22  GLUCOSE 102*  --  146* 124* 132*  BUN 17  --  16 24* 28*  CREATININE 1.12 0.94 0.97 0.98 0.89  CALCIUM 9.1  --  8.8* 9.1 9.0    Terrilee Croak, MD  [facility] 07/05/2019

## 2019-07-05 NOTE — Care Management Important Message (Signed)
Important Message  Patient Details IM Letter given to Gabriel Earing RN Case Manager to present to the Patient Name: Timothy Duncan MRN: [id number] Date of Birth: 05-22-1941   Medicare Important Message Given:  Yes     Kerin Salen 07/05/2019, 10:19 AM

## 2019-07-05 NOTE — Progress Notes (Signed)
Informed that patient refused blood draw for labs. Will attempt to draw labs at later time, per lab. Dawson Bills, RN

## 2019-07-06 DIAGNOSIS — U071 COVID-19: Principal | ICD-10-CM

## 2019-07-06 DIAGNOSIS — Z7189 Other specified counseling: Secondary | ICD-10-CM

## 2019-07-06 DIAGNOSIS — Z515 Encounter for palliative care: Secondary | ICD-10-CM

## 2019-07-06 DIAGNOSIS — J1282 Pneumonia due to coronavirus disease 2019: Secondary | ICD-10-CM

## 2019-07-06 LAB — CBC WITH DIFFERENTIAL/PLATELET
Abs Immature Granulocytes: 0.09 10*3/uL — ABNORMAL HIGH (ref 0.00–0.07)
Basophils Absolute: 0 10*3/uL (ref 0.0–0.1)
Basophils Relative: 0 %
Eosinophils Absolute: 0 10*3/uL (ref 0.0–0.5)
Eosinophils Relative: 0 %
HCT: 41 % (ref 39.0–52.0)
Hemoglobin: 13.3 g/dL (ref 13.0–17.0)
Immature Granulocytes: 1 %
Lymphocytes Relative: 14 %
Lymphs Abs: 1.3 10*3/uL (ref 0.7–4.0)
MCH: 30.6 pg (ref 26.0–34.0)
MCHC: 32.4 g/dL (ref 30.0–36.0)
MCV: 94.3 fL (ref 80.0–100.0)
Monocytes Absolute: 0.3 10*3/uL (ref 0.1–1.0)
Monocytes Relative: 3 %
Neutro Abs: 7.3 10*3/uL (ref 1.7–7.7)
Neutrophils Relative %: 82 %
Platelets: 477 10*3/uL — ABNORMAL HIGH (ref 150–400)
RBC: 4.35 MIL/uL (ref 4.22–5.81)
RDW: 13.4 % (ref 11.5–15.5)
WBC: 9.1 10*3/uL (ref 4.0–10.5)
nRBC: 0 % (ref 0.0–0.2)

## 2019-07-06 LAB — COMPREHENSIVE METABOLIC PANEL
ALT: 52 U/L — ABNORMAL HIGH (ref 0–44)
AST: 42 U/L — ABNORMAL HIGH (ref 15–41)
Albumin: 3.2 g/dL — ABNORMAL LOW (ref 3.5–5.0)
Alkaline Phosphatase: 51 U/L (ref 38–126)
Anion gap: 10 (ref 5–15)
BUN: 31 mg/dL — ABNORMAL HIGH (ref 8–23)
CO2: 22 mmol/L (ref 22–32)
Calcium: 9 mg/dL (ref 8.9–10.3)
Chloride: 104 mmol/L (ref 98–111)
Creatinine, Ser: 1.01 mg/dL (ref 0.61–1.24)
GFR calc Af Amer: >60 mL/min (ref >60)
GFR calc non Af Amer: >60 mL/min (ref >60)
Glucose, Bld: 144 mg/dL — ABNORMAL HIGH (ref 70–99)
Potassium: 4.4 mmol/L (ref 3.5–5.1)
Sodium: 136 mmol/L (ref 135–145)
Total Bilirubin: 0.7 mg/dL (ref 0.3–1.2)
Total Protein: 6.8 g/dL (ref 6.5–8.1)

## 2019-07-06 LAB — FERRITIN: Ferritin: 930 ng/mL — ABNORMAL HIGH (ref 24–336)

## 2019-07-06 LAB — D-DIMER, QUANTITATIVE: D-Dimer, Quant: 3.66 ug/mL-FEU — ABNORMAL HIGH (ref 0.00–0.50)

## 2019-07-06 LAB — C-REACTIVE PROTEIN: CRP: 0.9 mg/dL (ref <1.0)

## 2019-07-06 NOTE — Progress Notes (Addendum)
  Speech Language Pathology Treatment: Dysphagia  Patient Details Name: Timothy Duncan MRN: [id number] DOB: 11/24/1940 Today's Date: 07/06/2019 Time: [id number] SLP Time Calculation (min) (ACUTE ONLY): 12 min  Assessment / Plan / Recommendation Clinical Impression  Patient seen to assess po tolerance and assure further swallow testing not needed in xray.  He was sitting up in bed consuming Magic Cup and partially consumed spaghetti on plate.  Occasional cough x3 during entire session noted - after Magic Cup bolus and pineapple.  Unfortunately cough is weak and not productive.  Pt admits to some issues wit coughing on food but states "so" when SLP advised concern for this.  Recommend modify diet to dys3/ground meats/thin to mitigate aspiration risk.  Advise he continue to self feed and intermittent supervision to assure po tolerance.    Pt states he would like to eat/drink as he wishes however with his dementia and full code status - would recommend to follow up with his family.     SLP will follow up next date to assure tolerance and will contact family to establish baseline.  At this time, with his COVID +, xray is unable to perform MBS but If family desires ongoing full code and concern for aspiration persists, would recommend MBS as OP when pt stabilizes and is no longer COVID +.   Advised RN to recommendations and modifications.     HPI HPI: 79yo male admitted 07/02/19 with dyspnea, elevated ddimer, +Covid 12/24. PMH: dementia, HTN, CHF, HLD, DVT, CVA, PE. CXR = New right upper lobe consolidation suspicious for pneumonia.      SLP Plan  Continue with current plan of care       Recommendations  Medication Administration: Whole meds with liquid(as tolerated, take with pudding/applesauce if needed) Compensations: Slow rate;Small sips/bites Postural Changes and/or Swallow Maneuvers: Seated upright 90 degrees;Upright 30-60 min after meal                Oral Care Recommendations: Oral  care BID Follow up Recommendations: Other (comment)(TBD) SLP Visit Diagnosis: Dysphagia, oropharyngeal phase (R13.12) Plan: Continue with current plan of care       GO                Macario Golds 07/06/2019, 12:57 PM  Kathleen Lime, MS [facility] SLP [place] Office [phone]

## 2019-07-06 NOTE — Plan of Care (Signed)

## 2019-07-06 NOTE — Progress Notes (Signed)
Patient removed IV placed by IV team. Timothy Duncan to place mittens and start remdesivir and found in bed with patient. Second IV team consult placed.

## 2019-07-06 NOTE — Consult Note (Signed)
Consultation Note Date: 07/06/2019   Patient Name: Timothy Duncan Timothy Duncan  DOB: [birth year]  MRN: [id number]  Age / Sex: 79 y.o., male  PCP: Caren Macadam, MD Referring Physician: Terrilee Croak, MD  Reason for Consultation: Establishing goals of care  HPI/Patient Profile: 79 y.o. male  with past medical history of dementia, hypertension, systolic CHF, hyperlipidemia, history of DVT (no anticoagulation due to falls), CVA admitted on 07/02/2019 from [facility] with dyspnea, hypoxia, and reported elevated D-dimer. Known COVID+ 12/24. Treating for COVID multifocal pneumonia and bilateral lower extremity DVT. Recent admission outside hospital 12/3-12/11 with noted depression and failure to thrive.   Clinical Assessment and Goals of Care: I met this morning with [name] after reviewing his records and speaking with nurse. Nurse reports agitation and restlessness as he is reported to have pulled out IVs. Not reported to be trying to exit bed. She also notes pocketing with medications at times.   He is alert and able to communicate with me. He is oriented to person and knows that he is in the hospital but unable to tell me which hospital or any of the details of his diagnoses or health condition. He tells me that the date is July 03, 2018. After explaining that he has COVID, pneumonia, and BLE blood clots he seems very surprised. I told him that I would call his family to let them know what is going on with him- in which he tells me not to call his family and when asked why he tells me because he doesn't even know what is going on with him (this was after I had just explained that he has COVID, pneumonia, and blood clots in his legs). I attempted to reassure him. I do not feel that he has capacity for decision making at this time.   I spoke with son, Timothy Duncan Duncan, and explained and answered questions about his father's diagnosis to  the best of my ability. We were able to discuss concerns with anticoagulation and risk of trauma with a fall on anticoagulation. We also discussed concern for underlying dementia as well as swallowing dysfunction (dysphagia 3 diet recommended by SLP). Timothy Duncan Duncan wants [place] to practice best medical decisions in order to try and help his father to improve and be safe and mitigate risks to his health and further decline. He is concerned about placement  After discharge which he understands could be soon given that his father is stable.   All questions/concerns addressed. Emotional support provided. Timothy Duncan Duncan has requested that we contact his sister-in-law, Timothy Duncan Duncan, with any medical issues/concerns as she is a physician and will be able to understand and explain better to sons. Timothy Duncan Duncan will be contact for any other matters including placement.   Primary Decision Maker NEXT OF KIN 2 sons Timothy Duncan Duncan and Timothy Duncan Duncan)    SUMMARY OF RECOMMENDATIONS   - Will need further goals of care conversation - [name] is confused and does not have capacity for decision making at time of my visit - Consider outpatient palliative referral  Code Status/Advance Care Planning:  Full code - did not specifically discuss today   Symptom Management:   Per attending.   Depression: Previously on mirtazapine but d/c would not restart due to risk of blood clots. Options limited with elevated QTc otherwise I would recommend Zyprexa 2.5 mg qhs which can also stimulate appetite. Psych at outside hospital recommended Abilify 2 mg daily AS NEEDED for agitation per records.   Palliative Prophylaxis:   Aspiration and Delirium Protocol  Additional Recommendations (Limitations, Scope, Preferences):  Full Scope Treatment  Psycho-social/Spiritual:   Desire for further Chaplaincy support:no  Additional Recommendations: Caregiving  Support/Resources and Formal Capacity Evaluation  Prognosis:   Many recent health complications that have led  to significant functional decline. High risk for further complications and decline.   Discharge Planning: [place] for rehab with Palliative care service follow-up      Primary Diagnoses: Present on Admission: . COVID-19 virus infection . Essential hypertension . Backache . Chronic combined systolic and diastolic CHF (congestive heart failure) ([place]) . Elevated LFTs . Lactic acidosis   I have reviewed the medical record, interviewed the patient and family, and examined the patient. The following aspects are pertinent.  Past Medical History:  Diagnosis Date  . DVT (deep venous thrombosis) ([place]) 07/2015   RLE  . ED (erectile dysfunction)   . Hypertension   . Pulmonary embolism ([place])   . TIA (transient ischemic attack)    Social History   Socioeconomic History  . Marital status: Divorced    Spouse name: Not on file  . Number of children: Not on file  . Years of education: Not on file  . Highest education level: Not on file  Occupational History  . Not on file  Tobacco Use  . Smoking status: Former Smoker    Packs/day: 3.00    Years: 30.00    Pack years: 90.00    Types: Cigarettes  . Smokeless tobacco: Former Systems developer    Types: Snuff, Chew  . Tobacco comment: "quit smoking in the 1980s; used chew and snuff off and on, none for awhile"  Substance and Sexual Activity  . Alcohol use: No    Alcohol/week: 0.0 standard drinks    Comment: 03/31/2016 "no drinking since 06/22/2005"  . Drug use: No  . Sexual activity: Not Currently    Partners: Male  Other Topics Concern  . Not on file  Social History Narrative  . Not on file   Social Determinants of Health   Financial Resource Strain:   . Difficulty of Paying Living Expenses: Not on file  Food Insecurity:   . Worried About Charity fundraiser in the Last Year: Not on file  . Ran Out of Food in the Last Year: Not on file  Transportation Needs:   . Lack of Transportation (Medical): Not on file  . Lack of  Transportation (Non-Medical): Not on file  Physical Activity:   . Days of Exercise per Week: Not on file  . Minutes of Exercise per Session: Not on file  Stress:   . Feeling of Stress : Not on file  Social Connections:   . Frequency of Communication with Friends and Family: Not on file  . Frequency of Social Gatherings with Friends and Family: Not on file  . Attends Religious Services: Not on file  . Active Member of Clubs or Organizations: Not on file  . Attends Archivist Meetings: Not on file  . Marital Status: Not on file  Family History  Problem Relation Age of Onset  . Early death Father        MVA  . Alcohol abuse Maternal Grandfather    Scheduled Meds: . apixaban  10 mg Oral BID   Followed by  . [START ON 07/10/2019] apixaban  5 mg Oral BID  . vitamin C  500 mg Oral Daily  . carvedilol  6.25 mg Oral BID WC  . dexamethasone (DECADRON) injection  6 mg Intravenous Q24H  . feeding supplement (ENSURE ENLIVE)  237 mL Oral BID BM  . Ipratropium-Albuterol  1 puff Inhalation Q6H  . losartan  25 mg Oral Daily  . Melatonin  6 mg Oral QHS  . multivitamin with minerals  1 tablet Oral Daily  . spironolactone  12.5 mg Oral Daily  . zinc sulfate  220 mg Oral Daily   Continuous Infusions: . remdesivir 100 mg in NS 100 mL Stopped (07/06/19 0110)   PRN Meds:.acetaminophen **OR** acetaminophen, bisacodyl, guaiFENesin-dextromethorphan, ondansetron **OR** ondansetron (ZOFRAN) IV, oxyCODONE, senna-docusate No Known Allergies Review of Systems  Constitutional: Positive for activity change and fatigue.  Neurological: Positive for weakness.    Physical Exam Vitals and nursing note reviewed.  Constitutional:      Appearance: Normal appearance.  Cardiovascular:     Rate and Rhythm: Normal rate and regular rhythm.  Pulmonary:     Effort: Pulmonary effort is normal. No tachypnea, accessory muscle usage or respiratory distress.     Breath sounds: Normal breath sounds.   Abdominal:     General: Abdomen is flat.     Palpations: Abdomen is soft.  Neurological:     Mental Status: He is alert. He is confused.     Comments: Oriented to person and place     Vital Signs: BP 114/61 (BP Location: Right Arm)   Pulse 61   Temp (!) 97.4 F (36.3 C) (Oral)   Resp 16   Ht _0  (1.803 m)   Wt 85 kg   SpO2 100%   BMI 26.14 kg/m  Pain Scale: 0-10   Pain Score: 0-No pain   SpO2: SpO2: 100 % O2 Device:SpO2: 100 % O2 Flow Rate: .O2 Flow Rate (L/min): 2 L/min  IO: Intake/output summary:   Intake/Output Summary (Last 24 hours) at 07/06/2019 0832 Last data filed at 07/05/2019 1831 Gross per 24 hour  Intake 420 ml  Output 1150 ml  Net -730 ml    LBM: Last BM Date: (UTA) Baseline Weight: Weight: 90.7 kg Most recent weight: Weight: 85 kg     Palliative Assessment/Data:     Time In/Out: 0900-0930, 1630-1710 Time Total: 70 min Greater than 50%  of this time was spent counseling and coordinating care related to the above assessment and plan.  Signed by: Vinie Sill, NP Palliative Medicine Team Pager # [phone] (M-F 8a-5p) Team Phone # [phone] (Nights/Weekends)

## 2019-07-06 NOTE — Progress Notes (Signed)
Patient has self removed IV line for the 2nd time this evening, verbal redirection, orientation and limit setting ineffective. Applied bitts bilaterally to discourage patient from pulling at lines and tubes.

## 2019-07-06 NOTE — Progress Notes (Signed)
VAST placed IV access for Remdesivir administration. Placed mittens on both patient's hands before leaving bedside. Reminded patient not to pull on any equipment.

## 2019-07-06 NOTE — Progress Notes (Signed)
PROGRESS NOTE  Timothy Duncan  DOB: Sep 11, 1940  PCP: Caren Macadam, MD [id number]  DOA: 07/02/2019 Admitted From: [place]  LOS: 4 days   Chief Complaint  Patient presents with  . Covid pos  . Abnormal Lab   Brief narrative: Timothy Duncan is a 79 y.o. male with PMH of HTN, HLD, CVA, chronic combined systolic and diastolic CHF, dementia, history of DVT off anticoagulation because of frequent falls. Patient presented to the ED on 07/02/2019 from [facility] with complaint of dyspnea, hypoxia and elevated D-dimer 12/24, patient was diagnosed with COVID-19.  1/2 -patient was noted to be hypoxic.  D-dimer was checked and it was noted to be elevated because of which patient was sent to the ED for further evaluation.    In the ED, patient was hemodynamically stable.  Oxygen saturation 96% on 2 L.  Not on oxygen at baseline. Lab work was remarkable for WBC count 9, CRP elevated to 8.9, lactic acid elevated to 3.1, ferritin elevated to >1000.  Procalcitonin less than 0.10.   Chest x-ray with right upper lobe consolidation significant for likely underlying pneumonia.   CTPA negative for pulmonary embolism but with multilobar airspace opacification consistent with multifocal pneumonia.   Given patient's hypoxia, elevated inflammatory markers and underlying dyspnea, and recent diagnosis of COVID-19, patient was admitted to hospitalist medicine service for further evaluation and management.   Subjective: Patient was seen and examined this morning.  Elderly Caucasian male.   Alert, awake, confused, demented at baseline.   No new symptoms.  Assessment/Plan: COVID pneumonia Acute respiratory failure with hypoxia  -Presented with dyspnea, hypoxia, elevated D-dimer -Chest imaging -CT chest showed multifocal pneumonia. -Treatment: Patient received a dose of Actemra in the ED.  Currently on IV Decadron 6 mg daily for 10 days, IV Remdesivir for 5 days completed today on 1/6. -Supportive  care: Vitamin C, Zinc, inhalers, Tylenol, Antitussives - benzonatate, Mucinex -Oxygen - SpO2: 100 % O2 Flow Rate (L/min): 2 L/min . Wean down oxygen as tolerated. -Continue airborne/contact isolation precautions. -Labs and biomarker trend as below.  Gradually trending down.  Lab Results  Component Value Date   SARSCOV2NAA POSITIVE (A) 07/02/2019    Recent Labs  Lab 07/02/19 2012 07/03/19 0434 07/04/19 0401 07/05/19 0426 07/06/19 0353  WBC 9.2 6.7 9.2 8.9 9.1   Recent Labs    07/04/19 0401 07/05/19 0426 07/06/19 0353  DDIMER 8.44* 3.74* 3.66*  FERRITIN 914* 757* 930*  CRP 2.9* 1.4* 0.9   Acute bilateral DVT History of DVT -Patient has a history of DVT and was on anticoagulation which reportedly was stopped because of frequent falls.   -CTPA on this admission was negative for pulmonary embolism -However, lower extremity duplex is positive for acute DVT bilaterally. -I had a conversation with patient's son. He would like patient to be treated for DVT with Eliquis.    Cardiovascular issues : HTN, HLD, CVA, chronic combined systolic and diastolic CHF -TTE [id number] showed EF of 25 to 30%, LV dilation, grade 1 diastolic dysfunction, diffuse hypokinesis. -Home meds include Coreg, losartan, Aldactone. -Heart failure seems fairly well compensated. -Continue to monitor clinically.   Questionable history of aspiration/dysphagia -Speech therapy evaluation obtained. -Aspiration precautions  Weakness/debility: -Patient with history of multiple falls, recent hospitalization at [facility] health from 12/3 through 06/10/2019 for failure to thrive; and subsequently discharged to nursing facility. -PT/OT evaluation obtained.  SNF recommended. -Fall precautions  Mobility: PT eval obtained.  SNF recommended. Diet: Cardiac diet Fluid:  Not on IV fluid DVT prophylaxis:  Eliquis Code Status:  Full code.  I reconfirmed that with patient's son. Family Communication:  I called and  updated patient's son [name] on 1/5. Expected Discharge:  Stable for discharge to SNF.  Care management on board.  Consultants:    Procedures:    Antimicrobials: Anti-infectives (From admission, onward)   Start     Dose/Rate Route Frequency Ordered Stop   07/03/19 1000  remdesivir 100 mg in sodium chloride 0.9 % 100 mL IVPB  Status:  Discontinued     100 mg 200 mL/hr over 30 Minutes Intravenous Daily 07/02/19 1951 07/02/19 1953   07/03/19 1000  remdesivir 100 mg in sodium chloride 0.9 % 100 mL IVPB     100 mg 200 mL/hr over 30 Minutes Intravenous Daily 07/02/19 1757 07/07/19 0959   07/02/19 1950  remdesivir 200 mg in sodium chloride 0.9% 250 mL IVPB  Status:  Discontinued     200 mg 580 mL/hr over 30 Minutes Intravenous Once 07/02/19 1951 07/02/19 1953   07/02/19 1830  remdesivir 200 mg in sodium chloride 0.9% 250 mL IVPB     200 mg 580 mL/hr over 30 Minutes Intravenous Once 07/02/19 1757 07/02/19 1933       Code Status: Full Code   Diet Order            Diet Heart Room service appropriate? Yes; Fluid consistency: Thin  Diet effective now              Infusions:  . remdesivir 100 mg in NS 100 mL Stopped (07/06/19 0110)    Scheduled Meds: . apixaban  10 mg Oral BID   Followed by  . [START ON 07/10/2019] apixaban  5 mg Oral BID  . vitamin C  500 mg Oral Daily  . carvedilol  6.25 mg Oral BID WC  . dexamethasone (DECADRON) injection  6 mg Intravenous Q24H  . feeding supplement (ENSURE ENLIVE)  237 mL Oral BID BM  . Ipratropium-Albuterol  1 puff Inhalation Q6H  . losartan  25 mg Oral Daily  . Melatonin  6 mg Oral QHS  . multivitamin with minerals  1 tablet Oral Daily  . spironolactone  12.5 mg Oral Daily  . zinc sulfate  220 mg Oral Daily    PRN meds: acetaminophen **OR** acetaminophen, bisacodyl, guaiFENesin-dextromethorphan, ondansetron **OR** ondansetron (ZOFRAN) IV, oxyCODONE, senna-docusate   Objective: Vitals:   07/06/19 0506 07/06/19 0933   BP: 114/61 122/82  Pulse: 61 70  Resp: 16 16  Temp: (!) 97.4 F (36.3 C)   SpO2: 100%     Intake/Output Summary (Last 24 hours) at 07/06/2019 1314 Last data filed at 07/06/2019 1000 Gross per 24 hour  Intake 360 ml  Output 2750 ml  Net -2390 ml   Filed Weights   07/04/19 0457 07/05/19 0448 07/06/19 0506  Weight: 85.3 kg 85.1 kg 85 kg   Weight change: -0.095 kg Body mass index is 26.14 kg/m.   Physical Exam: General exam: Appears calm and comfortable.  Not in physical distress Skin: No rashes, lesions or ulcers. HEENT: Atraumatic, normocephalic, supple neck, no obvious bleeding Lungs: Clear to auscultation bilaterally. CVS: Regular rate and rhythm, no murmur GI/Abd soft, nontender, nondistended, bowel sound present CNS: Alert, awake, able to answer few questions only.  Demented at baseline.  Able to follow motor commands. Psychiatry: Depressed look. Extremities: No calf tenderness, no pedal edema  Data Review: I have personally reviewed the laboratory data and studies  available.  Recent Labs  Lab 07/02/19 1505 07/02/19 2012 07/03/19 0434 07/04/19 0401 07/05/19 0426 07/06/19 0353  WBC 9.0 9.2 6.7 9.2 8.9 9.1  NEUTROABS 5.6  --  5.6 7.8* 7.1 7.3  HGB 12.6* 13.2 13.9 12.7* 13.3 13.3  HCT 40.1 41.5 43.7 40.3 41.5 41.0  MCV 97.1 96.7 97.3 96.2 95.6 94.3  PLT 422* 405* 432* 512* 455* 477*   Recent Labs  Lab 07/02/19 1203 07/02/19 2012 07/03/19 0434 07/04/19 0401 07/05/19 0426 07/06/19 0353  NA 145  --  143 139 137 136  K 4.0  --  4.6 4.4 4.4 4.4  CL 107  --  110 109 106 104  CO2 25  --  23 20* 22 22  GLUCOSE 102*  --  146* 124* 132* 144*  BUN 17  --  16 24* 28* 31*  CREATININE 1.12 0.94 0.97 0.98 0.89 1.01  CALCIUM 9.1  --  8.8* 9.1 9.0 9.0    Terrilee Croak, MD  [facility] 07/06/2019

## 2019-07-07 LAB — COMPREHENSIVE METABOLIC PANEL
ALT: 49 U/L — ABNORMAL HIGH (ref 0–44)
AST: 34 U/L (ref 15–41)
Albumin: 3.2 g/dL — ABNORMAL LOW (ref 3.5–5.0)
Alkaline Phosphatase: 48 U/L (ref 38–126)
Anion gap: 9 (ref 5–15)
BUN: 26 mg/dL — ABNORMAL HIGH (ref 8–23)
CO2: 21 mmol/L — ABNORMAL LOW (ref 22–32)
Calcium: 8.9 mg/dL (ref 8.9–10.3)
Chloride: 105 mmol/L (ref 98–111)
Creatinine, Ser: 0.84 mg/dL (ref 0.61–1.24)
GFR calc Af Amer: >60 mL/min (ref >60)
GFR calc non Af Amer: >60 mL/min (ref >60)
Glucose, Bld: 147 mg/dL — ABNORMAL HIGH (ref 70–99)
Potassium: 4.4 mmol/L (ref 3.5–5.1)
Sodium: 135 mmol/L (ref 135–145)
Total Bilirubin: 0.8 mg/dL (ref 0.3–1.2)
Total Protein: 6.7 g/dL (ref 6.5–8.1)

## 2019-07-07 LAB — D-DIMER, QUANTITATIVE: D-Dimer, Quant: 3.4 ug/mL-FEU — ABNORMAL HIGH (ref 0.00–0.50)

## 2019-07-07 LAB — CBC WITH DIFFERENTIAL/PLATELET
Abs Immature Granulocytes: 0.09 10*3/uL — ABNORMAL HIGH (ref 0.00–0.07)
Basophils Absolute: 0 10*3/uL (ref 0.0–0.1)
Basophils Relative: 0 %
Eosinophils Absolute: 0 10*3/uL (ref 0.0–0.5)
Eosinophils Relative: 0 %
HCT: 43.4 % (ref 39.0–52.0)
Hemoglobin: 13.6 g/dL (ref 13.0–17.0)
Immature Granulocytes: 1 %
Lymphocytes Relative: 15 %
Lymphs Abs: 1.5 10*3/uL (ref 0.7–4.0)
MCH: 30.2 pg (ref 26.0–34.0)
MCHC: 31.3 g/dL (ref 30.0–36.0)
MCV: 96.4 fL (ref 80.0–100.0)
Monocytes Absolute: 0.4 10*3/uL (ref 0.1–1.0)
Monocytes Relative: 4 %
Neutro Abs: 8 10*3/uL — ABNORMAL HIGH (ref 1.7–7.7)
Neutrophils Relative %: 80 %
Platelets: 488 10*3/uL — ABNORMAL HIGH (ref 150–400)
RBC: 4.5 MIL/uL (ref 4.22–5.81)
RDW: 13.7 % (ref 11.5–15.5)
WBC: 10.1 10*3/uL (ref 4.0–10.5)
nRBC: 0 % (ref 0.0–0.2)

## 2019-07-07 LAB — CULTURE, BLOOD (ROUTINE X 2)
Culture: NO GROWTH
Culture: NO GROWTH

## 2019-07-07 LAB — C-REACTIVE PROTEIN: CRP: 0.7 mg/dL (ref <1.0)

## 2019-07-07 LAB — FERRITIN: Ferritin: 930 ng/mL — ABNORMAL HIGH (ref 24–336)

## 2019-07-07 NOTE — Progress Notes (Signed)
  Speech Language Pathology Treatment: Dysphagia  Patient Details Name: Timothy Duncan MRN: [id number] DOB: 1941-02-02 Today's Date: 07/07/2019 Time: [id number] SLP Time Calculation (min) (ACUTE ONLY): 19 min  Assessment / Plan / Recommendation Clinical Impression  Pt's NT reports she fed pt breakfast and lunch and pt was not observed to cough or have overt difficulties.  SLP notes pt's voice remains weak/dysphonic.  He was observed consuming icecream, peaches, Ensure and water via large Cone cup with large straw and via straw.  Minimal coughing noted with large straw usage - to which SLP brought to pt's attention and he stated "you noticed that" when SLP advised need to minimize aspiration as much as able.   SLP modified pt's diet to dys3/thin yesterday due to him demonstrating prolonged coughing with raw pineapple bolus.  Suspect inadequate mastication due to his weakness.    Would recommend to continue dys3/thin diet = providing liquids via SMALL straws - not large Cone cup.  Consider medications with puree if coughing.  Suspect he may eat at slower rate if eating independently - but highly recommend intermittent supervision and adjust to full if pt having any difficulties.  Dependent on [place] - would advise pt undergo OP MBS when he is no longer contagious with COVID as highly suspect intermittent aspiration and instrumental evaluation would allow visualization of swallowing musculature/function to assure appropriate diet/comepnsations in place.     SNF recommended, will sign off at this time.  Pt educated to recommendations - and provided RN with swallow precaution sign for him.  However given pt's cognitive deficits, he likely will not recall recommendations.     HPI HPI: 79yo male admitted 07/02/19 with dyspnea, elevated ddimer, +Covid 12/24. PMH: dementia, HTN, CHF, HLD, DVT, CVA, PE. CXR = New right upper lobe consolidation suspicious for pneumonia.  MD ordered swallow evaluation as per [facility] notes,  concern for aspiration was present during hospital coarse.      SLP Plan  Continue with current plan of care(highly recommend pt have an OP MBS when he is not a COVID risk as MBS not able to be performed at this time)       Recommendations  Diet recommendations: Dysphagia 3 (mechanical soft);Thin liquid Liquids provided via: Cup;Straw Medication Administration: Whole meds with liquid(as tolerated, take with pudding/applesauce if needed) Supervision: Patient able to self feed;Intermittent supervision to cue for compensatory strategies Compensations: Slow rate;Small sips/bites Postural Changes and/or Swallow Maneuvers: Seated upright 90 degrees;Upright 30-60 min after meal                Oral Care Recommendations: Oral care BID Follow up Recommendations: Other (comment);Skilled Nursing facility(TBD) SLP Visit Diagnosis: Dysphagia, oropharyngeal phase (R13.12) Plan: Continue with current plan of care(highly recommend pt have an OP MBS when he is not a COVID risk as MBS not able to be performed at this time)       [place], Rohail Klees Ann 07/07/2019, 6:10 PM   Kathleen Lime, MS [place] Office [phone]

## 2019-07-07 NOTE — Progress Notes (Signed)
PROGRESS NOTE  Timothy Duncan  DOB: [birth year]  PCP: Caren Macadam, MD [id number]  DOA: 07/02/2019 Admitted From: [place]  LOS: 5 days   Chief Complaint  Patient presents with  . Covid pos  . Abnormal Lab   Brief narrative: Timothy Duncan is a 79 y.o. male with PMH of HTN, HLD, CVA, chronic combined systolic and diastolic CHF, dementia, history of DVT off anticoagulation because of frequent falls. Patient presented to the ED on 07/02/2019 from [facility] with complaint of dyspnea, hypoxia and elevated D-dimer 12/24, patient was diagnosed with COVID-19.  1/2 -patient was noted to be hypoxic.  D-dimer was checked and it was noted to be elevated because of which patient was sent to the ED for further evaluation.    In the ED, patient was hemodynamically stable.  Oxygen saturation 96% on 2 L.  Not on oxygen at baseline. Lab work was remarkable for WBC count 9, CRP elevated to 8.9, lactic acid elevated to 3.1, ferritin elevated to >1000.  Procalcitonin less than 0.10.   Chest x-ray with right upper lobe consolidation significant for likely underlying pneumonia.   CTPA negative for pulmonary embolism but with multilobar airspace opacification consistent with multifocal pneumonia.   Given patient's hypoxia, elevated inflammatory markers and underlying dyspnea, and recent diagnosis of COVID-19, patient was admitted to hospitalist medicine service for further evaluation and management.   Subjective: Patient was seen and examined this morning.  Elderly Caucasian male.   Alert, awake, confused, demented at baseline.   No new symptoms.  Essentially no change in status last 2-3 days.   Assessment/Plan: COVID pneumonia Acute respiratory failure with hypoxia  -Presented with dyspnea, hypoxia, elevated D-dimer -Chest imaging -CT chest showed multifocal pneumonia. -Treatment: Patient received a dose of Actemra in the ED.  Currently on IV Decadron 6 mg daily for 10 days, IV Remdesivir  for 5 days completed today on 1/6. -Supportive care: Vitamin C, Zinc, inhalers, Tylenol, Antitussives - benzonatate, Mucinex -Oxygen - SpO2: 91 % O2 Flow Rate (L/min): 2 L/min . Wean down oxygen as tolerated. -Continue airborne/contact isolation precautions. -Labs and biomarker trend as below.  WBC has always been normal.  CRP down to normal.  Ferritin and D-dimer still remain elevated we will continue to trend.  Lab Results  Component Value Date   SARSCOV2NAA POSITIVE (A) 07/02/2019    Recent Labs  Lab 07/03/19 0434 07/04/19 0401 07/05/19 0426 07/06/19 0353 07/07/19 0319  WBC 6.7 9.2 8.9 9.1 10.1   Recent Labs    07/05/19 0426 07/06/19 0353 07/07/19 0319  DDIMER 3.74* 3.66* 3.40*  FERRITIN 757* 930* 930*  CRP 1.4* 0.9 0.7   Acute bilateral DVT History of DVT -Patient has a history of DVT and was on anticoagulation which reportedly was stopped because of frequent falls.   -CTPA on this admission was negative for pulmonary embolism -However, lower extremity duplex is positive for acute DVT bilaterally. -I had a conversation with patient's son on 1/5. He would like patient to be treated for DVT with Eliquis.    Cardiovascular issues : HTN, HLD, CVA, chronic combined systolic and diastolic CHF -TTE XX123456 showed EF of 25 to 30%, LV dilation, grade 1 diastolic dysfunction, diffuse hypokinesis. -Home meds include Coreg, losartan, Aldactone. -Heart failure seems fairly well compensated. -Continue to monitor clinically.   Questionable history of aspiration/dysphagia -Speech therapy evaluation obtained. -Aspiration precautions  Weakness/debility: -Patient with history of multiple falls, recent hospitalization at [facility] from 12/3 through  06/10/2019 for failure to thrive; and subsequently discharged to nursing facility. -PT/OT evaluation obtained.  SNF recommended. -Fall precautions  Mobility: PT eval obtained.  SNF recommended. Diet: Cardiac  diet Fluid: Not on IV fluid DVT prophylaxis:  Eliquis Code Status:  Full code.  I reconfirmed that with patient's son. Family Communication:  I called and updated patient's son Mr. Subhash Upshur on 1/5.  Palliative care involved as well. Expected Discharge:  Stable for discharge to SNF. Care management on board.  Consultants:    Procedures:    Antimicrobials: Anti-infectives (From admission, onward)   Start     Dose/Rate Route Frequency Ordered Stop   07/03/19 1000  remdesivir 100 mg in sodium chloride 0.9 % 100 mL IVPB  Status:  Discontinued     100 mg 200 mL/hr over 30 Minutes Intravenous Daily 07/02/19 1951 07/02/19 1953   07/03/19 1000  remdesivir 100 mg in sodium chloride 0.9 % 100 mL IVPB     100 mg 200 mL/hr over 30 Minutes Intravenous Daily 07/02/19 1757 07/07/19 0359   07/02/19 1950  remdesivir 200 mg in sodium chloride 0.9% 250 mL IVPB  Status:  Discontinued     200 mg 580 mL/hr over 30 Minutes Intravenous Once 07/02/19 1951 07/02/19 1953   07/02/19 1830  remdesivir 200 mg in sodium chloride 0.9% 250 mL IVPB     200 mg 580 mL/hr over 30 Minutes Intravenous Once 07/02/19 1757 07/02/19 1933       Code Status: Full Code   Diet Order            DIET DYS 3 Room service appropriate? No; Fluid consistency: Thin  Diet effective now              Infusions:    Scheduled Meds: . apixaban  10 mg Oral BID   Followed by  . [START ON 07/10/2019] apixaban  5 mg Oral BID  . vitamin C  500 mg Oral Daily  . carvedilol  6.25 mg Oral BID WC  . dexamethasone (DECADRON) injection  6 mg Intravenous Q24H  . feeding supplement (ENSURE ENLIVE)  237 mL Oral BID BM  . Ipratropium-Albuterol  1 puff Inhalation Q6H  . losartan  25 mg Oral Daily  . Melatonin  6 mg Oral QHS  . multivitamin with minerals  1 tablet Oral Daily  . spironolactone  12.5 mg Oral Daily  . zinc sulfate  220 mg Oral Daily    PRN meds: acetaminophen **OR** acetaminophen, bisacodyl,  guaiFENesin-dextromethorphan, ondansetron **OR** ondansetron (ZOFRAN) IV, oxyCODONE, senna-docusate   Objective: Vitals:   07/07/19 0511 07/07/19 1100  BP: 134/90 109/86  Pulse: 68 79  Resp: 20 20  Temp: (!) 97.4 F (36.3 C) 97.9 F (36.6 C)  SpO2: 93% 91%    Intake/Output Summary (Last 24 hours) at 07/07/2019 1345 Last data filed at 07/07/2019 0940 Gross per 24 hour  Intake 457 ml  Output 850 ml  Net -393 ml   Filed Weights   07/05/19 0448 07/06/19 0506 07/07/19 0511  Weight: 85.1 kg 85 kg 83.9 kg   Weight change: -1.085 kg Body mass index is 25.8 kg/m.   Physical Exam: General exam: Appears calm and comfortable.  Not in physical distress, Skin: No rashes, lesions or ulcers. HEENT: Atraumatic, normocephalic, supple neck, no obvious bleeding Lungs: Clear to auscultation bilaterally. CVS: Regular rate and rhythm, no murmur GI/Abd soft, nontender, nondistended, bowel sound present CNS: Alert, awake, able to answer few questions only. Demented at baseline. Able  to follow motor commands.  Not restless or agitated. Psychiatry: Depressed look. Extremities: No calf tenderness, no pedal edema  Data Review: I have personally reviewed the laboratory data and studies available.  Recent Labs  Lab 07/03/19 0434 07/04/19 0401 07/05/19 0426 07/06/19 0353 07/07/19 0319  WBC 6.7 9.2 8.9 9.1 10.1  NEUTROABS 5.6 7.8* 7.1 7.3 8.0*  HGB 13.9 12.7* 13.3 13.3 13.6  HCT 43.7 40.3 41.5 41.0 43.4  MCV 97.3 96.2 95.6 94.3 96.4  PLT 432* 512* 455* 477* 488*   Recent Labs  Lab 07/03/19 0434 07/04/19 0401 07/05/19 0426 07/06/19 0353 07/07/19 0319  NA 143 139 137 136 135  K 4.6 4.4 4.4 4.4 4.4  CL 110 109 106 104 105  CO2 23 20* 22 22 21*  GLUCOSE 146* 124* 132* 144* 147*  BUN 16 24* 28* 31* 26*  CREATININE 0.97 0.98 0.89 1.01 0.84  CALCIUM 8.8* 9.1 9.0 9.0 8.9    Terrilee Croak, MD  [facility] 07/07/2019

## 2019-07-07 NOTE — Progress Notes (Signed)
Physical Therapy Treatment Patient Details Name: Timothy Duncan MRN: [id number] DOB: [birth year] Today's Date: 07/07/2019    History of Present Illness Timothy Duncan is a 79 y.o. male with medical history significant of dementia, essential hypertension, chronic systolic congestive heart failure, hyperlipidemia, history of DVT off anticoagulation secondary to frequent falls, CVA who presents from [facility] with dyspnea, hypoxia, and report reportedly elevated D-dimer and found to have acute DVTs. Patient with known positive Covid-19 diagnosed on 06/23/2019.    PT Comments    Patient progressing gradually with therapy and continues to require mod assist +2 for bed mob and transfers. Multimodal cues required for sequencing and assist to initiate power up for sit<>stand. Pt with greater difficulty side-stepping/turning to move bed to [facility] compared to forward gait with RW. Pt remained on 2L/min for O2 and SpO2 remained stable at 93% or greater. Acute PT will continue to progress as able.   Follow Up Recommendations  SNF     Equipment Recommendations  None recommended by PT    Recommendations for Other Services       Precautions / Restrictions Precautions Precautions: Fall Restrictions Weight Bearing Restrictions: No    Mobility  Bed Mobility Overal bed mobility: Needs Assistance Bed Mobility: Supine to Sit;Sit to Supine     Supine to sit: Mod assist;HOB elevated Sit to supine: HOB elevated;Mod assist;+2 for safety/equipment   General bed mobility comments: Pt slid down low in bed and layign diagonally when therapist arrived. Verbal cues and assist for sequencing LE mobility to roll to Rt side and walk LE's to EOB. Mod assist to raise trunk upright.  Transfers Overall transfer level: Needs assistance Equipment used: Rolling walker (2 wheeled) Transfers: Sit to/from Stand Sit to Stand: Mod assist;+2 physical assistance;From elevated surface         General transfer comment:  Mod assist +2 from elevated EOB, verbal/tactile cues for hand placement and technique with RW and power up. Pt with posterior lean upon rising and assist requried to steady.  Ambulation/Gait Ambulation/Gait assistance: Mod assist;+2 physical assistance;+2 safety/equipment Gait Distance (Feet): 5 Feet Assistive device: Rolling walker (2 wheeled) Gait Pattern/deviations: Step-to pattern;Decreased step length - right;Decreased step length - left;Decreased stride length;Shuffle;Narrow base of support Gait velocity: slow and unsteady   General Gait Details: cues for hand placement on RW and verbal/tactile cues for posture throughout. Pt taking small shuffling steps throughout and requried assist to manage RW to maintain safe proximity. Mod assist to prevent LOB. pt much smoother with forward gait and increaed difficulty for turning to [facility] or to sit back on bed.   Stairs             Wheelchair Mobility    Modified Rankin (Stroke Patients Only)       Balance Overall balance assessment: Needs assistance Sitting-balance support: Feet supported Sitting balance-Leahy Scale: Fair     Standing balance support: Bilateral upper extremity supported Standing balance-Leahy Scale: Poor                 Cognition Arousal/Alertness: Awake/alert Behavior During Therapy: WFL for tasks assessed/performed Overall Cognitive Status: No family/caregiver present to determine baseline cognitive functioning Area of Impairment: Following commands;Safety/judgement;Problem solving      Following Commands: Follows multi-step commands inconsistently;Follows one step commands with increased time;Follows multi-step commands with increased time;Follows one step commands consistently     Problem Solving: Slow processing;Decreased initiation;Difficulty sequencing;Requires verbal cues;Requires tactile cues General Comments: hx of dementia      Exercises  General Comments        Pertinent  Vitals/Pain Pain Assessment: No/denies pain           PT Goals (current goals can now be found in the care plan section) Acute Rehab PT Goals Patient Stated Goal: none stated PT Goal Formulation: Patient unable to participate in goal setting Time For Goal Achievement: 07/18/19 Potential to Achieve Goals: Fair Progress towards PT goals: Progressing toward goals    Frequency    Min 2X/week      PT Plan Current plan remains appropriate    Co-evaluation              AM-PAC PT "6 Clicks" Mobility   Outcome Measure  Help needed turning from your back to your side while in a flat bed without using bedrails?: A Lot Help needed moving from lying on your back to sitting on the side of a flat bed without using bedrails?: A Lot Help needed moving to and from a bed to a chair (including a wheelchair)?: A Lot Help needed standing up from a chair using your arms (e.g., wheelchair or bedside chair)?: A Lot Help needed to walk in hospital room?: A Lot Help needed climbing 3-5 steps with a railing? : Total 6 Click Score: 11    End of Session Equipment Utilized During Treatment: Gait belt Activity Tolerance: Patient tolerated treatment well Patient left: in bed;with call bell/phone within reach;with bed alarm set Nurse Communication: Mobility status PT Visit Diagnosis: Unsteadiness on feet (R26.81);Other abnormalities of gait and mobility (R26.89);Muscle weakness (generalized) (M62.81)     Time: [id number] PT Time Calculation (min) (ACUTE ONLY): 31 min  Charges:  $Therapeutic Activity: 23-37 mins                    Verner Mould, DPT Physical Therapist with [facility] [phone]  07/07/2019 5:51 PM

## 2019-07-07 NOTE — Progress Notes (Signed)
Palliative:  HPI: 79 y.o. male  with past medical history of dementia, hypertension, systolic CHF, hyperlipidemia, history of DVT (no anticoagulation due to falls), CVA admitted on 07/02/2019 from [facility] with dyspnea, hypoxia, and reported elevated D-dimer. Known COVID+ 12/24. Treating for COVID multifocal pneumonia and bilateral lower extremity DVT. Recent admission outside hospital 12/3-12/11 with noted depression and failure to thrive.    I met again today with Timothy Duncan. He is resting in bed. He does answer my questions but very short and curt with mostly just quick yes or no answer. He again seems confused when I tell him about treating him for COVID, pneumonia, and blood clots in legs. He does not recall this information and appears to be learning this for the first time. He continues with confusion and pulling out IVs requiring mittens. He is asking for mittens to be removed and I told them this is at the discretion of nursing and he keeps removing IV therefore we have to keep sticking him. He does not recall removing IVs. Emotional support provided.   I called and spoke with son, Timothy Duncan, and updated that there are no options at this time for placement but that his father is likely medically stable for discharge when placement is secured. Updated on above. Timothy Duncan is asking about capacity evaluation and I explained that it is clear to me that his father does NOT have capacity for decision making and has been consistently confused as documented by providers and nursing. I explained that with this assessment that the majority of adult children would be his healthcare decision makers (in absence of documented HCPOA or spouse).   We also discussed concern that he is very high risk for further decline and complications given his poor functional status and recent significant decline. His father has never expressed his wishes but Timothy Duncan tells [place] that they want resuscitation and full aggressive care "to keep  him alive." I confirm decision. I also encouraged family to continue discussions amongst themselves overtime as my fear is the damage an acute resuscitation would lead to further decline in mental status and overall QOL. Timothy Duncan understands and appreciates the information.   All questions/concerns addressed. Emotional support provided.   Exam: Alert, oriented to person only. Confused. Breathing regular, unlabored on room air. Abd soft, flat. No distress.   Plan: - Full aggressive care desired.  - Transition to SNF when bed available.  - Could benefit from outpatient palliative care to follow along.   [place], NP Palliative Medicine Team Pager [phone] (Please see [email or web address] for schedule) Team Phone [phone]    Greater than 50%  of this time was spent counseling and coordinating care related to the above assessment and plan

## 2019-07-07 NOTE — Plan of Care (Signed)

## 2019-07-08 NOTE — Progress Notes (Signed)
PROGRESS NOTE  Timothy Duncan  DOB: [birth year]  PCP: Timothy Macadam, MD [id number]  DOA: 07/02/2019 Admitted From: [facility]  LOS: 6 days   Chief Complaint  Patient presents with  . Covid pos  . Abnormal Lab   Brief narrative: Timothy Duncan is a 79 y.o. Duncan with PMH of HTN, HLD, CVA, chronic combined systolic and diastolic CHF, dementia, history of DVT off anticoagulation because of frequent falls. Patient presented to the ED on 07/02/2019 from [facility] with complaint of dyspnea, hypoxia and elevated D-dimer 12/24, patient was diagnosed with COVID-19.  1/2 -patient was noted to be hypoxic.  D-dimer was checked and it was noted to be elevated because of which patient was sent to the ED for further evaluation.    In the ED, patient was hemodynamically stable.  Oxygen saturation 96% on 2 L.  Not on oxygen at baseline. Lab work was remarkable for WBC count 9, CRP elevated to 8.9, lactic acid elevated to 3.1, ferritin elevated to >1000.  Procalcitonin less than 0.10.   Chest x-ray with right upper lobe consolidation significant for likely underlying pneumonia.   CTPA negative for pulmonary embolism but with multilobar airspace opacification consistent with multifocal pneumonia.   Given patient's hypoxia, elevated inflammatory markers and underlying dyspnea, and recent diagnosis of COVID-19, patient was admitted to hospitalist medicine service for further evaluation and management.   Subjective: Patient was seen and examined this morning. Timothy Duncan.   Alert, awake, confused, demented at baseline.   Has mittens in both hands.  Knows he is in the hospital, able to follow motor commands. No new symptoms. Essentially no change in status last 3-4 days.  Pending SNF placement.  Assessment/Plan: COVID pneumonia Acute respiratory failure with hypoxia  -Presented with dyspnea, hypoxia, elevated D-dimer -Chest imaging -CT chest showed multifocal pneumonia. -Treatment:  Patient received a dose of Actemra in the ED.  Currently on Decadron 6 mg daily for 10 days, IV Remdesivir for 5 days completed on 1/6. -Supportive care: Vitamin C, Zinc, inhalers, Tylenol, Antitussives - benzonatate, Mucinex -Oxygen - SpO2: 96 % O2 Flow Rate (L/min): 2 L/min . Wean down oxygen as tolerated. -Continue airborne/contact isolation precautions. -Labs and biomarker trend as below.  WBC has always been normal.  CRP down to normal.   Lab Results  Component Value Date   SARSCOV2NAA POSITIVE (A) 07/02/2019    Recent Labs  Lab 07/03/19 0434 07/04/19 0401 07/05/19 0426 07/06/19 0353 07/07/19 0319  WBC 6.7 9.2 8.9 9.1 10.1   Recent Labs    07/06/19 0353 07/07/19 0319  DDIMER 3.66* 3.40*  FERRITIN 930* 930*  CRP 0.9 0.7   Acute bilateral DVT History of DVT -Patient has a history of DVT and was on anticoagulation which reportedly was stopped because of frequent falls.   -CTPA on this admission was negative for pulmonary embolism -However, lower extremity duplex was positive for acute DVT bilaterally. -I had a conversation with patient's son on 1/5. He would like patient to be treated for DVT with Eliquis.    Advanced dementia -Only oriented to place.  Has episodes of agitation.  Has mittens in place. -Have discussed with son.  Son wanted to get a medical opinion on patient's decision-making capacity.  I do not think patient has medical decision making capacity.  Discussed with palliative care. Palliative care stated the same as well.  Cardiovascular issues : HTN, HLD, CVA, chronic combined systolic and diastolic CHF -TTE [id number] showed EF of 25  to 30%, LV dilation, grade 1 diastolic dysfunction, diffuse hypokinesis. -Home meds include Coreg, losartan, Aldactone. -Heart failure seems fairly well compensated. -Continue to monitor clinically.   Questionable history of aspiration/dysphagia -Speech therapy evaluation obtained. -Aspiration  precautions  Weakness/debility: -Patient with history of multiple falls, recent hospitalization at [facility] from 12/3 through 06/10/2019 for failure to thrive; and subsequently discharged to nursing facility. -PT/OT evaluation obtained.  SNF recommended. -Fall precautions  Mobility: PT eval obtained.  SNF recommended. Diet: Cardiac diet Fluid: Not on IV fluid DVT prophylaxis:  Eliquis Code Status:  Full code.  I reconfirmed that with patient's son. Family Communication:  I called and updated patient's son Timothy Duncan. Expected Discharge:  Stable for discharge to SNF. Care management on board.  Consultants:    Procedures:    Antimicrobials: Anti-infectives (From admission, onward)   Start     Dose/Rate Route Frequency Ordered Stop   07/03/19 1000  remdesivir 100 mg in sodium chloride 0.9 % 100 mL IVPB  Status:  Discontinued     100 mg 200 mL/hr over 30 Minutes Intravenous Daily 07/02/19 1951 07/02/19 1953   07/03/19 1000  remdesivir 100 mg in sodium chloride 0.9 % 100 mL IVPB     100 mg 200 mL/hr over 30 Minutes Intravenous Daily 07/02/19 1757 07/07/19 0359   07/02/19 1950  remdesivir 200 mg in sodium chloride 0.9% 250 mL IVPB  Status:  Discontinued     200 mg 580 mL/hr over 30 Minutes Intravenous Once 07/02/19 1951 07/02/19 1953   07/02/19 1830  remdesivir 200 mg in sodium chloride 0.9% 250 mL IVPB     200 mg 580 mL/hr over 30 Minutes Intravenous Once 07/02/19 1757 07/02/19 1933       Code Status: Full Code   Diet Order            DIET DYS 3 Room service appropriate? No; Fluid consistency: Thin  Diet effective now              Infusions:    Scheduled Meds: . apixaban  10 mg Oral BID   Followed by  . [START ON 07/10/2019] apixaban  5 mg Oral BID  . vitamin C  500 mg Oral Daily  . carvedilol  6.25 mg Oral BID WC  . dexamethasone (DECADRON) injection  6 mg Intravenous Q24H  . feeding supplement (ENSURE ENLIVE)  237 mL Oral BID BM  .  Ipratropium-Albuterol  1 puff Inhalation Q6H  . losartan  25 mg Oral Daily  . Melatonin  6 mg Oral QHS  . multivitamin with minerals  1 tablet Oral Daily  . spironolactone  12.5 mg Oral Daily  . zinc sulfate  220 mg Oral Daily    PRN meds: acetaminophen **OR** acetaminophen, bisacodyl, guaiFENesin-dextromethorphan, ondansetron **OR** ondansetron (ZOFRAN) IV, oxyCODONE, senna-docusate   Objective: Vitals:   07/08/19 0643 07/08/19 1329  BP: 125/80 132/75  Pulse: 65 75  Resp: 20 20  Temp: 97.6 F (36.4 C) 97.6 F (36.4 C)  SpO2: 95% 96%    Intake/Output Summary (Last 24 hours) at 07/08/2019 1702 Last data filed at 07/08/2019 1330 Gross per 24 hour  Intake 370 ml  Output 1100 ml  Net -730 ml   Filed Weights   07/05/19 0448 07/06/19 0506 07/07/19 0511  Weight: 85.1 kg 85 kg 83.9 kg   Weight change:  Body mass index is 25.8 kg/m.   Physical Exam: General exam: Appears calm and comfortable.  Not in physical distress,  has mittens in both hands Skin: No rashes, lesions or ulcers. HEENT: Atraumatic, normocephalic, supple neck, no obvious bleeding Lungs: Clear to auscultation bilaterally. CVS: Regular rate and rhythm, no murmur GI/Abd soft, nontender, nondistended, bowel sound present CNS: Alert, awake, able to answer few questions only. Demented at baseline. Able to follow motor commands.  Not restless or agitated. Psychiatry: Depressed look. Extremities: No calf tenderness, no pedal edema  Data Review: I have personally reviewed the laboratory data and studies available.  Recent Labs  Lab 07/03/19 0434 07/04/19 0401 07/05/19 0426 07/06/19 0353 07/07/19 0319  WBC 6.7 9.2 8.9 9.1 10.1  NEUTROABS 5.6 7.8* 7.1 7.3 8.0*  HGB 13.9 12.7* 13.3 13.3 13.6  HCT 43.7 40.3 41.5 41.0 43.4  MCV 97.3 96.2 95.6 94.3 96.4  PLT 432* 512* 455* 477* 488*   Recent Labs  Lab 07/03/19 0434 07/04/19 0401 07/05/19 0426 07/06/19 0353 07/07/19 0319  NA 143 139 137 136 135  K 4.6 4.4  4.4 4.4 4.4  CL 110 109 106 104 105  CO2 23 20* 22 22 21*  GLUCOSE 146* 124* 132* 144* 147*  BUN 16 24* 28* 31* 26*  CREATININE 0.97 0.98 0.89 1.01 0.84  CALCIUM 8.8* 9.1 9.0 9.0 8.9    Terrilee Croak, MD  [facility] 07/08/2019

## 2019-07-08 NOTE — TOC Progression Note (Signed)
Transition of Care [facility]) - Progression Note    Patient Details  Name: Timothy REVES MRN: [id number] Date of Birth: Feb 18, 1941  [facility] CM/SW Contact  Purcell Mouton, RN Phone Number: 07/08/2019, 11:25 AM  Clinical Narrative:    Continue to search for SNF bed. There are no male beds available at SNF for COVID pt's. A call to pt's son with no answer.         Expected Discharge Plan and Services                                                 Social Determinants of Health (SDOH) Interventions    Readmission Risk Interventions No flowsheet data found.

## 2019-07-09 MED ORDER — IPRATROPIUM-ALBUTEROL 20-100 MCG/ACT IN AERS
1.0000 | INHALATION_SPRAY | Freq: Two times a day (BID) | RESPIRATORY_TRACT | Status: DC
  Administered 2019-07-09 – 2019-07-10 (×2): 1 via RESPIRATORY_TRACT
  Filled 2019-07-09: qty 4

## 2019-07-09 NOTE — Progress Notes (Addendum)
PROGRESS NOTE  Timothy Duncan [id number] DOB: 1941-05-05 DOA: 07/02/2019 PCP: Caren Macadam, MD  HPI/Recap of past 24 hours: Per admission Timothy Duncan is a 79 y.o. male with PMH of HTN, HLD, CVA, chronic combined systolic and diastolic CHF, dementia, history of DVT off anticoagulation because of frequent falls. Patient presented to the ED on 07/02/2019 from [facility] with complaint of dyspnea, hypoxia and elevated D-dimer 12/24, patient was diagnosed with COVID-19.  1/2 -patient was noted to be hypoxic.  D-dimer was checked and it was noted to be elevated because of which patient was sent to the ED for further evaluation.    In the ED, patient was hemodynamically stable.  Oxygen saturation 96% on 2 L.  Not on oxygen at baseline. Lab work was remarkable for WBC count 9, CRP elevated to 8.9, lactic acid elevated to 3.1, ferritin elevated to >1000.  Procalcitonin less than 0.10.  Chest x-ray with right upper lobe consolidationsignificant for likely underlying pneumonia.  CTPA negative for pulmonary embolism but with multilobar airspace opacification consistent with multifocal pneumonia.  Given patient's hypoxia, elevated inflammatory markers and underlying dyspnea, and recent diagnosis of COVID-19, patient was admitted to hospitalist medicine service for further evaluation and management.  July 09, 2019 Subjective: Patient seen and examined at bedside.  Denies any new complaints or any other complaints overnight.  Patient is waiting for SNF  Assessment/Plan: Principal Problem:   COVID-19 virus infection Active Problems:   Essential hypertension   Backache   History of CVA (cerebrovascular accident)   Chronic combined systolic and diastolic CHF (congestive heart failure) (HCC)   History of DVT (deep vein thrombosis)   Elevated LFTs   Lactic acidosis  Assessment/Plan: COVID pneumonia Acute respiratory failure with hypoxia  -Patient presented with hypoxia and  dyspnea.  He was treated with a dose of azithromycin in the ED currently on IV Decadron 6 mg daily for 10 days IV remdesivir for 5 days which she completed on July 06, 2019.  We will continue supportive care.  Is currently on O2 nasal cannula at 2 L/min we will attempt to wean off.  We will continue supportive care   Acute bilateral DVT History of DVT -Patient is currently on Eliquis for DVT     Cardiovascular issues : HTN, HLD, CVA, chronic combined systolic and diastolic CHF -He had a TEE on 2019 which showed ejection fraction of 25 to 30%.  He is currently on Coreg losartan and Aldactone. He is fairly well compensated we will continue current management   Questionable history  of aspiration/dysphagia  -Patient has been evaluated by speech.  We will continue aspiration precaution    Weakness/debility: Patient has history of multiple falls.  He has received OT and PT evaluation they recommended SNF he is waiting for SNF We will continue fall precaution     Severity of Illness: The appropriate patient status for this patient is INPATIENT. Inpatient status is judged to be reasonable and necessary in order to provide the required intensity of service to ensure the patient's safety. The patient's presenting symptoms, physical exam findings, and initial radiographic and laboratory data in the context of their chronic comorbidities is felt to place them at high risk for further clinical deterioration. Furthermore, it is not anticipated that the patient will be medically stable for discharge from the hospital within 2 midnights of admission. The following factors support the patient status of inpatient.   " Waiting for SNF  * I certify that  at the point of admission it is my clinical judgment that the patient will require inpatient hospital care spanning beyond 2 midnights from the point of admission due to high intensity of service, high risk for further deterioration and high  frequency of surveillance required.*   Mobility: PT eval obtained.  SNF recommended. Diet: Cardiac diet Fluid: Not on IV fluid DVT prophylaxis: Eliquis Code Status: Full code.  Family Communication: [place] at bedside Expected Discharge: Stable for discharge to SNF. Care management on board.  Antimicrobials:  None   Objective: Vitals:   07/08/19 2033 07/09/19 0500 07/09/19 0530 07/09/19 0808  BP: 127/69  110/73   Pulse: (!) 102  (!) 57   Resp: 18  16   Temp: 98.5 F (36.9 C)  97.9 F (36.6 C)   TempSrc: Oral  Oral   SpO2: (!) 88%  (!) 83% 96%  Weight:  83.8 kg    Height:        Intake/Output Summary (Last 24 hours) at 07/09/2019 0831 Last data filed at 07/09/2019 0532 Gross per 24 hour  Intake 490 ml  Output 1300 ml  Net -810 ml   Filed Weights   07/06/19 0506 07/07/19 0511 07/09/19 0500  Weight: 85 kg 83.9 kg 83.8 kg   Body mass index is 25.77 kg/m.  Exam:  . General: 79 y.o. year-old male well developed well nourished in no acute distress.  Alert and oriented x3.  Elderly male pleasant . Cardiovascular: Regular rate and rhythm with no rubs or gallops.  No thyromegaly or JVD noted.   Marland Kitchen Respiratory: Clear to auscultation with no wheezes or rales. Good inspiratory effort. . Abdomen: Soft nontender nondistended with normal bowel sounds x4 quadrants. . Musculoskeletal: No lower extremity edema. 2/4 pulses in all 4 extremities. . Skin: No ulcerative lesions noted or rashes, . Psychiatry: Mood is appropriate for condition and setting    Data Reviewed: CBC: Recent Labs  Lab 07/03/19 0434 07/04/19 0401 07/05/19 0426 07/06/19 0353 07/07/19 0319  WBC 6.7 9.2 8.9 9.1 10.1  NEUTROABS 5.6 7.8* 7.1 7.3 8.0*  HGB 13.9 12.7* 13.3 13.3 13.6  HCT 43.7 40.3 41.5 41.0 43.4  MCV 97.3 96.2 95.6 94.3 96.4  PLT 432* 512* 455* 477* [id number]*   Basic Metabolic Panel: Recent Labs  Lab 07/03/19 0434 07/04/19 0401 07/05/19 0426 07/06/19 0353 07/07/19 0319  NA 143 139 137  136 135  K 4.6 4.4 4.4 4.4 4.4  CL 110 109 106 104 105  CO2 23 20* 22 22 21*  GLUCOSE 146* 124* 132* 144* 147*  BUN 16 24* 28* 31* 26*  CREATININE 0.97 0.98 0.89 1.01 0.84  CALCIUM 8.8* 9.1 9.0 9.0 8.9   GFR: Estimated Creatinine Clearance: 77.2 mL/min (by C-G formula based on SCr of 0.84 mg/dL). Liver Function Tests: Recent Labs  Lab 07/03/19 0434 07/04/19 0401 07/05/19 0426 07/06/19 0353 07/07/19 0319  AST 33 27 37 42* 34  ALT 36 34 42 52* 49*  ALKPHOS 52 50 52 51 48  BILITOT 0.7 0.7 0.8 0.7 0.8  PROT 7.1 7.1 6.9 6.8 6.7  ALBUMIN 2.9* 3.1* 3.1* 3.2* 3.2*   No results for input(s): LIPASE, AMYLASE in the last 168 hours. No results for input(s): AMMONIA in the last 168 hours. Coagulation Profile: No results for input(s): INR, PROTIME in the last 168 hours. Cardiac Enzymes: No results for input(s): CKTOTAL, CKMB, CKMBINDEX, TROPONINI in the last 168 hours. BNP (last 3 results) No results for input(s): PROBNP in the last 8760  hours. HbA1C: No results for input(s): HGBA1C in the last 72 hours. CBG: No results for input(s): GLUCAP in the last 168 hours. Lipid Profile: No results for input(s): CHOL, HDL, LDLCALC, TRIG, CHOLHDL, LDLDIRECT in the last 72 hours. Thyroid Function Tests: No results for input(s): TSH, T4TOTAL, FREET4, T3FREE, THYROIDAB in the last 72 hours. Anemia Panel: Recent Labs    07/07/19 0319  FERRITIN 930*   Urine analysis:    Component Value Date/Time   COLORURINE YELLOW 06/15/2018 0126   APPEARANCEUR CLEAR 06/15/2018 0126   LABSPEC 1.014 06/15/2018 0126   PHURINE 6.0 06/15/2018 0126   GLUCOSEU NEGATIVE 06/15/2018 0126   GLUCOSEU NEGATIVE 05/05/2018 1237   HGBUR SMALL (A) 06/15/2018 0126   HGBUR negative 04/19/2009 1004   BILIRUBINUR NEGATIVE 06/15/2018 0126   BILIRUBINUR n 04/28/2017 1426   KETONESUR 20 (A) 06/15/2018 0126   PROTEINUR NEGATIVE 06/15/2018 0126   UROBILINOGEN 0.2 05/05/2018 1237   NITRITE NEGATIVE 06/15/2018 0126    LEUKOCYTESUR NEGATIVE 06/15/2018 0126   Sepsis Labs: @LABRCNTIP (procalcitonin:4,lacticidven:4)  ) Recent Results (from the past 240 hour(s))  Blood Culture (routine x 2)     Status: None   Collection Time: 07/02/19 12:03 PM   Specimen: BLOOD  Result Value Ref Range Status   Specimen Description   Final    BLOOD LEFT ANTECUBITAL Performed at [facility], [place] [address] [place], [place] [address]    Special Requests   Final    BOTTLES DRAWN AEROBIC AND ANAEROBIC Blood Culture results may not be optimal due to an excessive volume of blood received in culture bottles Performed at [place] [address] [place], [place] [address]    Culture   Final    NO GROWTH 5 DAYS Performed at [facility], [place] [address], [place], [place] [address]    Report Status 07/07/2019 FINAL  Final  Blood Culture (routine x 2)     Status: None   Collection Time: 07/02/19 12:04 PM   Specimen: BLOOD RIGHT HAND  Result Value Ref Range Status   Specimen Description   Final    BLOOD RIGHT HAND Performed at [place] [address], [place], [place] [address]    Special Requests   Final    BOTTLES DRAWN AEROBIC ONLY Blood Culture results may not be optimal due to an inadequate volume of blood received in culture bottles Performed at [place] [address], [place], [place] [address]    Culture   Final    NO GROWTH 5 DAYS Performed at [facility], [place] [address], [place], [place] [address]    Report Status 07/07/2019 FINAL  Final  SARS CORONAVIRUS 2 (TAT 6-24 HRS) Nasopharyngeal Nasopharyngeal Swab     Status: Abnormal   Collection Time: 07/02/19  5:27 PM   Specimen: Nasopharyngeal Swab  Result Value Ref Range Status   SARS Coronavirus 2 POSITIVE (A) NEGATIVE Final    Comment: RESULT CALLED TO, READ BACK BY AND VERIFIED WITH: A. BULLOCK,RN 0136 07/03/2019 T. TYSOR (NOTE) SARS-CoV-2 target nucleic acids are  DETECTED. The SARS-CoV-2 RNA is generally detectable in upper and lower respiratory specimens during the acute phase of infection. Positive results are indicative of the presence of SARS-CoV-2 RNA. Clinical correlation with patient history and other diagnostic information is  necessary to determine patient infection status. Positive results do not rule out bacterial infection or co-infection with other viruses.  The expected result is Negative. Fact Sheet  for Patients: SugarRoll.be Fact Sheet for Healthcare Providers: https://www.woods-mathews.com/ This test is not yet approved or cleared by the [place] FDA and  has been authorized for detection and/or diagnosis of SARS-CoV-2 by FDA under an Emergency Use Authorization (EUA). This EUA will remain  in effect (meaning this test can be used) for  the duration of the COVID-19 declaration under Section 564(b)(1) of the Act, 21 U.S.C. section 360bbb-3(b)(1), unless the authorization is terminated or revoked sooner. Performed at [facility], [place] [address], [place], [place] [address]   MRSA PCR Screening     Status: None   Collection Time: 07/03/19  6:16 PM   Specimen: Nasal Mucosa; Nasopharyngeal  Result Value Ref Range Status   MRSA by PCR NEGATIVE NEGATIVE Final    Comment:        The GeneXpert MRSA Assay (FDA approved for NASAL specimens only), is one component of a comprehensive MRSA colonization surveillance program. It is not intended to diagnose MRSA infection nor to guide or monitor treatment for MRSA infections. Performed at [facility], [place] [address], [place], [place] [address]       Studies: No results found.  Scheduled Meds: . apixaban  10 mg Oral BID   Followed by  . [START ON 07/10/2019] apixaban  5 mg Oral BID  . vitamin C  500 mg Oral Daily  . carvedilol  6.25 mg Oral BID WC  . dexamethasone (DECADRON) injection  6 mg Intravenous Q24H   . feeding supplement (ENSURE ENLIVE)  237 mL Oral BID BM  . Ipratropium-Albuterol  1 puff Inhalation Q6H  . losartan  25 mg Oral Daily  . Melatonin  6 mg Oral QHS  . multivitamin with minerals  1 tablet Oral Daily  . spironolactone  12.5 mg Oral Daily  . zinc sulfate  220 mg Oral Daily    Continuous Infusions:   LOS: 7 days     Cristal Deer, MD [facility]  To reach me or the doctor on call, go to: [email or web address] Password [facility]  07/09/2019, 8:31 AM

## 2019-07-10 MED ORDER — IPRATROPIUM-ALBUTEROL 20-100 MCG/ACT IN AERS
1.0000 | INHALATION_SPRAY | Freq: Four times a day (QID) | RESPIRATORY_TRACT | Status: DC | PRN
  Filled 2019-07-10: qty 4

## 2019-07-10 NOTE — Progress Notes (Signed)
PROGRESS NOTE  Timothy Duncan [id number] DOB: Jan 20, 1941 DOA: 07/02/2019 PCP: Caren Macadam, MD  HPI/Recap of past 24 hours: Per admission [id number] Sutter is a 79 y.o. male with PMH of HTN, HLD, CVA, chronic combined systolic and diastolic CHF, dementia, history of DVT off anticoagulation because of frequent falls. Patient presented to the ED on 07/02/2019 from [facility] with complaint of dyspnea, hypoxia and elevated D-dimer 12/24, patient was diagnosed with COVID-19.  1/2 -patient was noted to be hypoxic.  D-dimer was checked and it was noted to be elevated because of which patient was sent to the ED for further evaluation.    In the ED, patient was hemodynamically stable.  Oxygen saturation 96% on 2 L.  Not on oxygen at baseline. Lab work was remarkable for WBC count 9, CRP elevated to 8.9, lactic acid elevated to 3.1, ferritin elevated to >1000.  Procalcitonin less than 0.10.  Chest x-ray with right upper lobe consolidationsignificant for likely underlying pneumonia.  CTPA negative for pulmonary embolism but with multilobar airspace opacification consistent with multifocal pneumonia.  Given patient's hypoxia, elevated inflammatory markers and underlying dyspnea, and recent diagnosis of COVID-19, patient was admitted to hospitalist medicine service for further evaluation and management.  July 09, 2019 Subjective: Patient seen and examined at bedside.  Denies any new complaints or any other complaints overnight.  Patient is waiting for SNF  July 10, 2019 Subjective: Patient seen and examined at bedside he denies any complaints.  Nurses did not report any complaints overnight.  Patient is waiting for SNF  Assessment/Plan: Principal Problem:   COVID-19 virus infection Active Problems:   Essential hypertension   Backache   History of CVA (cerebrovascular accident)   Chronic combined systolic and diastolic CHF (congestive heart failure) (HCC)   History of DVT  (deep vein thrombosis)   Elevated LFTs   Lactic acidosis  Assessment/Plan: COVID pneumonia Acute respiratory failure with hypoxia  -Patient presented with hypoxia and dyspnea.  He was treated with a dose of azithromycin in the ED currently on IV Decadron 6 mg daily for 10 days IV remdesivir for 5 days which she completed on July 06, 2019.  We will continue supportive care.  Is currently on O2 nasal cannula at 2 L/min we will attempt to wean off.  We will continue supportive care   Acute bilateral DVT History of DVT -Patient is currently on Eliquis for DVT     Cardiovascular issues : HTN, HLD, CVA, chronic combined systolic and diastolic CHF -He had a TEE on 2019 which showed ejection fraction of 25 to 30%.  He is currently on Coreg losartan and Aldactone. He is fairly well compensated we will continue current management   Questionable history  of aspiration/dysphagia  -Patient has been evaluated by speech.  We will continue aspiration precaution    Weakness/debility: Patient has history of multiple falls.  He has received OT and PT evaluation they recommended SNF he is waiting for SNF We will continue fall precaution     Severity of Illness: The appropriate patient status for this patient is INPATIENT. Inpatient status is judged to be reasonable and necessary in order to provide the required intensity of service to ensure the patient's safety. The patient's presenting symptoms, physical exam findings, and initial radiographic and laboratory data in the context of their chronic comorbidities is felt to place them at high risk for further clinical deterioration. Furthermore, it is not anticipated that the patient will be medically stable for  discharge from the hospital within 2 midnights of admission. The following factors support the patient status of inpatient.   " Waiting for SNF  * I certify that at the point of admission it is my clinical judgment that the patient will  require inpatient hospital care spanning beyond 2 midnights from the point of admission due to high intensity of service, high risk for further deterioration and high frequency of surveillance required.*   Mobility: PT eval obtained.  SNF recommended. Diet: Cardiac diet Fluid: Not on IV fluid DVT prophylaxis: Eliquis Code Status: Full code.  Family Communication: In[place] at bedside Expected Discharge: Stable for discharge to SNF. Care management on board.  Antimicrobials:  None   Objective: Vitals:   07/09/19 2035 07/10/19 0525 07/10/19 0801 07/10/19 1312  BP: 122/75 129/81  138/87  Pulse: 69 60  83  Resp: 20 20    Temp: 97.6 F (36.4 C) 99.1 F (37.3 C)    TempSrc: Oral     SpO2:  (!) 74% 92% 97%  Weight:      Height:        Intake/Output Summary (Last 24 hours) at 07/10/2019 1553 Last data filed at 07/10/2019 1314 Gross per 24 hour  Intake 600 ml  Output 1510 ml  Net -910 ml   Filed Weights   07/06/19 0506 07/07/19 0511 07/09/19 0500  Weight: 85 kg 83.9 kg 83.8 kg   Body mass index is 25.77 kg/m.  Exam:  . General: 79 y.o. year-old male well developed well nourished in no acute distress.  Alert and oriented x3.  Elderly male pleasant . Cardiovascular: Regular rate and rhythm with no rubs or gallops.  No thyromegaly or JVD noted.   Marland Kitchen Respiratory: Clear to auscultation with no wheezes or rales. Good inspiratory effort. . Abdomen: Soft nontender nondistended with normal bowel sounds x4 quadrants. . Musculoskeletal: No lower extremity edema. 2/4 pulses in all 4 extremities. . Skin: No ulcerative lesions noted or rashes, . Psychiatry: Mood is appropriate for condition and setting    Data Reviewed: CBC: Recent Labs  Lab 07/04/19 0401 07/05/19 0426 07/06/19 0353 07/07/19 0319  WBC 9.2 8.9 9.1 10.1  NEUTROABS 7.8* 7.1 7.3 8.0*  HGB 12.7* 13.3 13.3 13.6  HCT 40.3 41.5 41.0 43.4  MCV 96.2 95.6 94.3 96.4  PLT 512* 455* 477* [id number]*   Basic Metabolic  Panel: Recent Labs  Lab 07/04/19 0401 07/05/19 0426 07/06/19 0353 07/07/19 0319  NA 139 137 136 135  K 4.4 4.4 4.4 4.4  CL 109 106 104 105  CO2 20* 22 22 21*  GLUCOSE 124* 132* 144* 147*  BUN 24* 28* 31* 26*  CREATININE 0.98 0.89 1.01 0.84  CALCIUM 9.1 9.0 9.0 8.9   GFR: Estimated Creatinine Clearance: 77.2 mL/min (by C-G formula based on SCr of 0.84 mg/dL). Liver Function Tests: Recent Labs  Lab 07/04/19 0401 07/05/19 0426 07/06/19 0353 07/07/19 0319  AST 27 37 42* 34  ALT 34 42 52* 49*  ALKPHOS 50 52 51 48  BILITOT 0.7 0.8 0.7 0.8  PROT 7.1 6.9 6.8 6.7  ALBUMIN 3.1* 3.1* 3.2* 3.2*   No results for input(s): LIPASE, AMYLASE in the last 168 hours. No results for input(s): AMMONIA in the last 168 hours. Coagulation Profile: No results for input(s): INR, PROTIME in the last 168 hours. Cardiac Enzymes: No results for input(s): CKTOTAL, CKMB, CKMBINDEX, TROPONINI in the last 168 hours. BNP (last 3 results) No results for input(s): PROBNP in the last 8760 hours.  HbA1C: No results for input(s): HGBA1C in the last 72 hours. CBG: No results for input(s): GLUCAP in the last 168 hours. Lipid Profile: No results for input(s): CHOL, HDL, LDLCALC, TRIG, CHOLHDL, LDLDIRECT in the last 72 hours. Thyroid Function Tests: No results for input(s): TSH, T4TOTAL, FREET4, T3FREE, THYROIDAB in the last 72 hours. Anemia Panel: No results for input(s): VITAMINB12, FOLATE, FERRITIN, TIBC, IRON, RETICCTPCT in the last 72 hours. Urine analysis:    Component Value Date/Time   COLORURINE YELLOW 06/15/2018 0126   APPEARANCEUR CLEAR 06/15/2018 0126   LABSPEC 1.014 06/15/2018 0126   PHURINE 6.0 06/15/2018 0126   GLUCOSEU NEGATIVE 06/15/2018 0126   GLUCOSEU NEGATIVE 05/05/2018 1237   HGBUR SMALL (A) 06/15/2018 0126   HGBUR negative 04/19/2009 1004   BILIRUBINUR NEGATIVE 06/15/2018 0126   BILIRUBINUR n 04/28/2017 1426   KETONESUR 20 (A) 06/15/2018 0126   PROTEINUR NEGATIVE 06/15/2018  0126   UROBILINOGEN 0.2 05/05/2018 1237   NITRITE NEGATIVE 06/15/2018 0126   LEUKOCYTESUR NEGATIVE 06/15/2018 0126   Sepsis Labs: @LABRCNTIP (procalcitonin:4,lacticidven:4)  ) Recent Results (from the past 240 hour(s))  Blood Culture (routine x 2)     Status: None   Collection Time: 07/02/19 12:03 PM   Specimen: BLOOD  Result Value Ref Range Status   Specimen Description   Final    BLOOD LEFT ANTECUBITAL Performed at [facility], [place] [address] [place], [place] [address]    Special Requests   Final    BOTTLES DRAWN AEROBIC AND ANAEROBIC Blood Culture results may not be optimal due to an excessive volume of blood received in culture bottles Performed at [place] [address] [place], [place] [address]    Culture   Final    NO GROWTH 5 DAYS Performed at [facility], [place] [address], [place], [place] [address]    Report Status 07/07/2019 FINAL  Final  Blood Culture (routine x 2)     Status: None   Collection Time: 07/02/19 12:04 PM   Specimen: BLOOD RIGHT HAND  Result Value Ref Range Status   Specimen Description   Final    BLOOD RIGHT HAND Performed at [place] [address] [place], [place] [address]    Special Requests   Final    BOTTLES DRAWN AEROBIC ONLY Blood Culture results may not be optimal due to an inadequate volume of blood received in culture bottles Performed at [place] [address], [place], [place] [address]    Culture   Final    NO GROWTH 5 DAYS Performed at [facility], [place] [address], [place], [place] [address]    Report Status 07/07/2019 FINAL  Final  SARS CORONAVIRUS 2 (TAT 6-24 HRS) Nasopharyngeal Nasopharyngeal Swab     Status: Abnormal   Collection Time: 07/02/19  5:27 PM   Specimen: Nasopharyngeal Swab  Result Value Ref Range Status   SARS Coronavirus 2 POSITIVE (A) NEGATIVE Final    Comment: RESULT CALLED TO, READ BACK BY AND VERIFIED WITH: A.  BULLOCK,RN 0136 07/03/2019 T. TYSOR (NOTE) SARS-CoV-2 target nucleic acids are DETECTED. The SARS-CoV-2 RNA is generally detectable in upper and lower respiratory specimens during the acute phase of infection. Positive results are indicative of the presence of SARS-CoV-2 RNA. Clinical correlation with patient history and other diagnostic information is  necessary to determine patient infection status. Positive results do not rule out bacterial infection or co-infection with other viruses.  The expected result is Negative.  Fact Sheet for Patients: SugarRoll.be Fact Sheet for Healthcare Providers: https://www.woods-mathews.com/ This test is not yet approved or cleared by the [place] FDA and  has been authorized for detection and/or diagnosis of SARS-CoV-2 by FDA under an Emergency Use Authorization (EUA). This EUA will remain  in effect (meaning this test can be used) for  the duration of the COVID-19 declaration under Section 564(b)(1) of the Act, 21 U.S.C. section 360bbb-3(b)(1), unless the authorization is terminated or revoked sooner. Performed at [facility], [place] [address] [place], [place] [address]   MRSA PCR Screening     Status: None   Collection Time: 07/03/19  6:16 PM   Specimen: Nasal Mucosa; Nasopharyngeal  Result Value Ref Range Status   MRSA by PCR NEGATIVE NEGATIVE Final    Comment:        The GeneXpert MRSA Assay (FDA approved for NASAL specimens only), is one component of a comprehensive MRSA colonization surveillance program. It is not intended to diagnose MRSA infection nor to guide or monitor treatment for MRSA infections. Performed at [facility], [place] [address] [place], [place] [address]       Studies: No results found.  Scheduled Meds: . apixaban  5 mg Oral BID  . vitamin C  500 mg Oral Daily  . carvedilol  6.25 mg Oral BID WC  . dexamethasone (DECADRON) injection  6 mg  Intravenous Q24H  . feeding supplement (ENSURE ENLIVE)  237 mL Oral BID BM  . Ipratropium-Albuterol  1 puff Inhalation BID  . losartan  25 mg Oral Daily  . Melatonin  6 mg Oral QHS  . multivitamin with minerals  1 tablet Oral Daily  . spironolactone  12.5 mg Oral Daily  . zinc sulfate  220 mg Oral Daily    Continuous Infusions:   LOS: 8 days     Cristal Deer, MD [facility]  To reach me or the doctor on call, go to: [email or web address] Password [email or web address]  07/10/2019, 3:53 PM

## 2019-07-10 NOTE — Progress Notes (Signed)
[place] for Eliquis Indication: LE DVT  No Known Allergies  Patient Measurements: Height: 5\' 11"  (180.3 cm) Weight: 184 lb 11.9 oz (83.8 kg) IBW/kg (Calculated) : 75.3 Heparin Dosing Weight:   Vital Signs: Temp: 99.1 F (37.3 C) (01/10 0525) BP: 129/81 (01/10 0525) Pulse Rate: 60 (01/10 0525)  Labs: No results for input(s): HGB, HCT, PLT, APTT, LABPROT, INR, HEPARINUNFRC, HEPRLOWMOCWT, CREATININE, CKTOTAL, CKMB, TROPONINIHS in the last 72 hours.  Estimated Creatinine Clearance: 77.2 mL/min (by C-G formula based on SCr of 0.84 mg/dL).   Assessment: Patient's a 79 y.o M with hx DVT/PE (not on anticoag. PTA d/t fall risk) presented to the ED on 07/02/19 with COVID. Chest CTA on 1/2 was negative for PE.  LE doppler on 1/3 showed bilateral age indeterminate DVTs.  Pharmacy is consulted to start Eliquis for VTE.  - last dose of lovenox 40 mg given on 1/2 at 2206 - cbc stable - no bleeding documented   Plan:  Completed 7 days of Eliquis 10mg  bid, transitioned to maintenance dose 5mg  bid 1/10 am   Minda Ditto PharmD 07/10/2019,10:52 AM

## 2019-07-11 DIAGNOSIS — Z7401 Bed confinement status: Secondary | ICD-10-CM | POA: Diagnosis not present

## 2019-07-11 DIAGNOSIS — R1312 Dysphagia, oropharyngeal phase: Secondary | ICD-10-CM | POA: Diagnosis not present

## 2019-07-11 DIAGNOSIS — R262 Difficulty in walking, not elsewhere classified: Secondary | ICD-10-CM | POA: Diagnosis not present

## 2019-07-11 DIAGNOSIS — M6281 Muscle weakness (generalized): Secondary | ICD-10-CM | POA: Diagnosis not present

## 2019-07-11 DIAGNOSIS — M24561 Contracture, right knee: Secondary | ICD-10-CM | POA: Diagnosis not present

## 2019-07-11 DIAGNOSIS — R69 Illness, unspecified: Secondary | ICD-10-CM | POA: Diagnosis not present

## 2019-07-11 DIAGNOSIS — U071 COVID-19: Secondary | ICD-10-CM | POA: Diagnosis not present

## 2019-07-11 DIAGNOSIS — J189 Pneumonia, unspecified organism: Secondary | ICD-10-CM | POA: Diagnosis not present

## 2019-07-11 DIAGNOSIS — Z8673 Personal history of transient ischemic attack (TIA), and cerebral infarction without residual deficits: Secondary | ICD-10-CM | POA: Diagnosis not present

## 2019-07-11 DIAGNOSIS — Z86718 Personal history of other venous thrombosis and embolism: Secondary | ICD-10-CM | POA: Diagnosis not present

## 2019-07-11 DIAGNOSIS — I6990 Unspecified sequelae of unspecified cerebrovascular disease: Secondary | ICD-10-CM | POA: Diagnosis not present

## 2019-07-11 DIAGNOSIS — M2550 Pain in unspecified joint: Secondary | ICD-10-CM | POA: Diagnosis not present

## 2019-07-11 DIAGNOSIS — G20 Parkinson's disease: Secondary | ICD-10-CM | POA: Diagnosis not present

## 2019-07-11 DIAGNOSIS — R2681 Unsteadiness on feet: Secondary | ICD-10-CM | POA: Diagnosis not present

## 2019-07-11 DIAGNOSIS — I10 Essential (primary) hypertension: Secondary | ICD-10-CM | POA: Diagnosis not present

## 2019-07-11 DIAGNOSIS — J9601 Acute respiratory failure with hypoxia: Secondary | ICD-10-CM | POA: Diagnosis not present

## 2019-07-11 DIAGNOSIS — R402411 Glasgow coma scale score 13-15, in the field [EMT or ambulance]: Secondary | ICD-10-CM | POA: Diagnosis not present

## 2019-07-11 DIAGNOSIS — I5042 Chronic combined systolic (congestive) and diastolic (congestive) heart failure: Secondary | ICD-10-CM | POA: Diagnosis not present

## 2019-07-11 DIAGNOSIS — E785 Hyperlipidemia, unspecified: Secondary | ICD-10-CM | POA: Diagnosis not present

## 2019-07-11 LAB — CBC
HCT: 46.5 % (ref 39.0–52.0)
Hemoglobin: 15 g/dL (ref 13.0–17.0)
MCH: 30.7 pg (ref 26.0–34.0)
MCHC: 32.3 g/dL (ref 30.0–36.0)
MCV: 95.1 fL (ref 80.0–100.0)
Platelets: 411 10*3/uL — ABNORMAL HIGH (ref 150–400)
RBC: 4.89 MIL/uL (ref 4.22–5.81)
RDW: 14.4 % (ref 11.5–15.5)
WBC: 13.4 10*3/uL — ABNORMAL HIGH (ref 4.0–10.5)
nRBC: 0 % (ref 0.0–0.2)

## 2019-07-11 MED ORDER — BISACODYL 5 MG PO TBEC: 5.0000 mg | DELAYED_RELEASE_TABLET | Freq: Every day | ORAL | 0 refills | Status: AC | PRN

## 2019-07-11 MED ORDER — ENSURE ENLIVE PO LIQD: 237.0000 mL | Freq: Two times a day (BID) | ORAL | 12 refills | Status: AC

## 2019-07-11 MED ORDER — DEXAMETHASONE 6 MG PO TABS: 6.0000 mg | ORAL_TABLET | Freq: Every day | ORAL | 0 refills | Status: AC

## 2019-07-11 MED ORDER — IPRATROPIUM-ALBUTEROL 20-100 MCG/ACT IN AERS: 1.0000 | INHALATION_SPRAY | Freq: Four times a day (QID) | RESPIRATORY_TRACT | 1 refills | Status: AC | PRN

## 2019-07-11 MED ORDER — ASCORBIC ACID 500 MG PO TABS: 500.0000 mg | ORAL_TABLET | Freq: Every day | ORAL | 0 refills | Status: AC

## 2019-07-11 MED ORDER — APIXABAN 5 MG PO TABS: 5.0000 mg | ORAL_TABLET | Freq: Two times a day (BID) | ORAL | 0 refills | Status: AC

## 2019-07-11 MED ORDER — GUAIFENESIN-DM 100-10 MG/5ML PO SYRP: 10.0000 mL | ORAL_SOLUTION | ORAL | 0 refills | Status: AC | PRN

## 2019-07-11 MED ORDER — MELATONIN 3 MG PO TABS: 6.0000 mg | ORAL_TABLET | Freq: Every day | ORAL | 0 refills | Status: AC

## 2019-07-11 MED ORDER — ZINC SULFATE 220 (50 ZN) MG PO CAPS: 220.0000 mg | ORAL_CAPSULE | Freq: Every day | ORAL | 0 refills | Status: AC

## 2019-07-11 MED ORDER — ONDANSETRON HCL 4 MG PO TABS: 4.0000 mg | ORAL_TABLET | Freq: Four times a day (QID) | ORAL | 0 refills | Status: AC | PRN

## 2019-07-11 MED ORDER — ADULT MULTIVITAMIN W/MINERALS CH: 1.0000 | ORAL_TABLET | Freq: Every day | ORAL | 0 refills | Status: AC

## 2019-07-11 MED ORDER — SENNOSIDES-DOCUSATE SODIUM 8.6-50 MG PO TABS: 1.0000 | ORAL_TABLET | Freq: Every evening | ORAL | 1 refills | Status: AC | PRN

## 2019-07-11 NOTE — Discharge Summary (Signed)
Physician Discharge Summary  Timothy Duncan [id number] DOB: 1941/04/30 DOA: 07/02/2019  PCP: Caren Macadam, MD  Admit date: 07/02/2019 Discharge date: 07/11/2019  Admitted From:  SNF) Disposition:  SNF  Recommendations for Outpatient Follow-up:  1. Follow up with PCP in 2 days 2. Please obtain BMP/CBC in one week 3. Please follow up on the following pending results:  Home Health:no Equipment/Devices:  Discharge Condition:stable CODE STATUS:full code Diet recommendation: Heart Healthy    Brief/Interim Summary: Chief Complaint: Dyspnea, elevated ddimer, + Covid 12/24  HPI: Timothy Duncan is a 79 y.o. male with medical history significant of dementia, essential hypertension, chronic systolic congestive heart failure, hyperlipidemia, history of DVT off anticoagulation secondary to frequent falls, CVA who presents from [facility] with dyspnea, hypoxia, and report reportedly elevated D-dimer.  Patient with known positive Covid-19 diagnosed on 06/23/2019.  Apparently he was noted to be hypoxic today, labs reported outpatient with elevated D-dimer and was sent to the ED for further evaluation.  Patient with underlying dementia, unable to participate fully in ROS.  Discussed with patient's family, son [name] and daughter-in-law [name]; they have had little interaction with patient since his recent hospitalization at [facility] on 12/30-12/11 while at the nursing facility; and have no insight into his father's care during this time.  ED Course: Temperature 98.0, HR 83, RR 26, SPO2 96% on 2 L nasal cannula (was brought in by EMS on oxygen, reportedly hypoxic in the 80s); BP 139/84.  The BC count 9.0, hemoglobin 12.6, platelets 422.  Sodium 145, potassium 4.0, chloride 107, CO2 25, BUN 17, creatinine 1.12, glucose 102.  AST 50, ALT 48.  Total bilirubin 1.3.  CRP 8.9, lactic acid 3.1, ferritin 1009.  Procalcitonin less than 0.10.  Chest x-ray with right upper lobe consolidation  significant for likely underlying pneumonia.  CTPA negative for pulmonary embolism but with multilobar airspace opacification consistent with multifocal pneumonia.  Given patient's hypoxia, elevated inflammatory markers and underlying dyspnea, EDP referred patient for admission for further evaluation and treatment of multifocal pneumonia related to active COVID-19 infection.  July 09, 2019 Subjective: Patient seen and examined at bedside.  Denies any new complaints or any other complaints overnight.  Patient is waiting for SNF  July 10, 2019 Subjective: Patient seen and examined at bedside he denies any complaints.  Nurses did not report any complaints overnight.  Patient is waiting for SNF  July 11 2019 Patient in no respiratory distress Saturation 92% on room air Awaiting for placement  Acute respiratory failure with hypoxia resolved secondary to Covid pneumonia Received Zithromax in ED, IV Decadron for 10 days completed 9 remdesivir for 5 days completed on January 6 received Actemra 2 days for a CRP more than 7 Improved with oxygen nasal cannula 2 L Patient saturating more than 92% on room air today  History of DVT On Eliquis  Congestive heart failure systolic and diastolic dysfunction With ejection fraction 25 to 30% Stable on Coreg losartan and Aldactone  Dysphagia Evaluated by speech patient can swallow without risk of aspiration  Patient with history of multiple falls Evaluated by physical therapy recommended SNF,    Discharge Diagnoses:   Principal Problem:   COVID-19 virus infection Active Problems:   Essential hypertension   Backache   History of CVA (cerebrovascular accident)   Chronic combined systolic and diastolic CHF (congestive heart failure) (HCC)   History of DVT (deep vein thrombosis)   Elevated LFTs   Lactic acidosis    Discharge  Instructions Patient discharged to SNF  Allergies as of 07/11/2019   No Known Allergies     Medication  List    TAKE these medications   apixaban 5 MG Tabs tablet Commonly known as: ELIQUIS Take 1 tablet (5 mg total) by mouth 2 (two) times daily.   ascorbic acid 500 MG tablet Commonly known as: VITAMIN C Take 1 tablet (500 mg total) by mouth daily. Start taking on: July 12, 2019   bisacodyl 5 MG EC tablet Commonly known as: DULCOLAX Take 1 tablet (5 mg total) by mouth daily as needed for moderate constipation.   carvedilol 6.25 MG tablet Commonly known as: COREG Take 1 tablet (6.25 mg total) by mouth 2 (two) times daily with a meal.   dexamethasone 6 MG tablet Commonly known as: DECADRON Take 1 tablet (6 mg total) by mouth daily for 1 day.   dexamethasone 6 MG tablet Commonly known as: DECADRON Take 1 tablet (6 mg total) by mouth daily for 1 dose.   feeding supplement (ENSURE ENLIVE) Liqd Take 237 mLs by mouth 2 (two) times daily between meals.   guaiFENesin-dextromethorphan 100-10 MG/5ML syrup Commonly known as: ROBITUSSIN DM Take 10 mLs by mouth every 4 (four) hours as needed for cough.   Ipratropium-Albuterol 20-100 MCG/ACT Aers respimat Commonly known as: COMBIVENT Inhale 1 puff into the lungs every 6 (six) hours as needed for wheezing.   losartan 25 MG tablet Commonly known as: COZAAR Take 1 tablet (25 mg total) by mouth daily.   Melatonin 3 MG Tabs Take 2 tablets (6 mg total) by mouth at bedtime.   multivitamin with minerals Tabs tablet Take 1 tablet by mouth daily. Start taking on: July 12, 2019   ondansetron 4 MG tablet Commonly known as: ZOFRAN Take 1 tablet (4 mg total) by mouth every 6 (six) hours as needed for nausea.   senna-docusate 8.6-50 MG tablet Commonly known as: Senokot-S Take 1 tablet by mouth at bedtime as needed for mild constipation.   spironolactone 25 MG tablet Commonly known as: ALDACTONE Take 0.5 tablets (12.5 mg total) by mouth daily.   zinc sulfate 220 (50 Zn) MG capsule Take 1 capsule (220 mg total) by mouth  daily. Start taking on: July 12, 2019       No Known Allergies  Consultations:     Procedures/Studies: CT Angio Chest PE W and/or Wo Contrast  Result Date: 07/02/2019 CLINICAL DATA:  PE suspected, low/intermediate probability. Positive D-dimer. SOB. EXAM: CT ANGIOGRAPHY CHEST WITH CONTRAST TECHNIQUE: Multidetector CT imaging of the chest was performed using the standard protocol during bolus administration of intravenous contrast. Multiplanar CT image reconstructions and MIPs were obtained to evaluate the vascular anatomy. CONTRAST:  20mL OMNIPAQUE IOHEXOL 350 MG/ML SOLN COMPARISON:  None. FINDINGS: Cardiovascular: The heart size is mildly enlarged. There is aortic atherosclerosis. Calcifications in the LAD, left circumflex and RCA coronary arteries. The main pulmonary artery is patent. There is no central obstructing pulmonary embolus identified. No lobar pulmonary artery filling defects identified. There is respiratory motion artifact which diminishes exam detail beyond the level of the lobar pulmonary arteries. Mediastinum/Nodes: The trachea appears patent and is midline. Insert esophagus Normal appearance of the thyroid gland. No mediastinal or hilar adenopathy identified. Lungs/Pleura: No pleural effusion identified. Diminished pulmonary parenchymal detail secondary to respiratory motion artifact. Multifocal bilateral airspace opacities are identified. This is most severe in the right upper lobe and is favored to represent multifocal infection. Upper Abdomen: No acute abnormality. Musculoskeletal: No acute abnormality. Chronic,  mild superior endplate deformity involves the T12 vertebra. Unchanged from 06/02/2019. Review of the MIP images confirms the above findings. IMPRESSION: 1. Exam detail diminished secondary to respiratory motion artifact. No evidence for acute pulmonary embolus to the level of the lobar pulmonary arteries. 2. Multifocal bilateral airspace opacities are identified  compatible with multifocal pneumonia. 3. Aortic atherosclerosis. Multi vessel coronary artery calcifications noted. Aortic Atherosclerosis (ICD10-I70.0). Electronically Signed   By: Kerby Moors M.D.   On: 07/02/2019 15:40   DG Chest Port 1 View  Result Date: 07/02/2019 CLINICAL DATA:  Pt is Covid positive. Staff at nursing facility noted that the patient has had elevated d-dimers. H/o HTN, Pulmonary Embolism, and TIA. Former smoker. EXAM: PORTABLE CHEST 1 VIEW COMPARISON:  Chest radiograph 06/04/2019 FINDINGS: Stable cardiomediastinal contours. There is new consolidation throughout the right upper lobe suspicious for infection. The left lung is clear. No pneumothorax or large pleural effusion. No acute finding in the visualized skeleton. IMPRESSION: New right upper lobe consolidation suspicious for pneumonia. Recommend follow-up radiograph in 3-4 weeks to ensure resolution. Electronically Signed   By: Audie Pinto M.D.   On: 07/02/2019 12:39   VAS [place] LOWER EXTREMITY VENOUS (DVT)  Result Date: 07/03/2019  Lower Venous Study Indications: Elevated Ddimer.  Risk Factors: COVID 19 positive. Limitations: Patient positioning, patient movement. Comparison Study: No prior studies. Performing Technologist: Oliver Hum RVT  Examination Guidelines: A complete evaluation includes B-mode imaging, spectral Doppler, color Doppler, and power Doppler as needed of all accessible portions of each vessel. Bilateral testing is considered an integral part of a complete examination. Limited examinations for reoccurring indications may be performed as noted.  +---------+---------------+---------+-----------+----------+-----------------+ RIGHT    CompressibilityPhasicitySpontaneityPropertiesThrombus Aging    +---------+---------------+---------+-----------+----------+-----------------+ CFV      Full           Yes      Yes                                     +---------+---------------+---------+-----------+----------+-----------------+ SFJ      Full                                                           +---------+---------------+---------+-----------+----------+-----------------+ FV Prox  Full                                                           +---------+---------------+---------+-----------+----------+-----------------+ FV Mid   Full                                                           +---------+---------------+---------+-----------+----------+-----------------+ FV DistalFull                                                           +---------+---------------+---------+-----------+----------+-----------------+  PFV      Full                                                           +---------+---------------+---------+-----------+----------+-----------------+ POP      Full           Yes      Yes                                    +---------+---------------+---------+-----------+----------+-----------------+ PTV      Full                                                           +---------+---------------+---------+-----------+----------+-----------------+ PERO     None                                         Age Indeterminate +---------+---------------+---------+-----------+----------+-----------------+   +---------+---------------+---------+-----------+----------+-----------------+ LEFT     CompressibilityPhasicitySpontaneityPropertiesThrombus Aging    +---------+---------------+---------+-----------+----------+-----------------+ CFV      Full           Yes      Yes                                    +---------+---------------+---------+-----------+----------+-----------------+ SFJ      Full                                                           +---------+---------------+---------+-----------+----------+-----------------+ FV Prox  None           No       No                    Age Indeterminate +---------+---------------+---------+-----------+----------+-----------------+ FV Mid   Full                                                           +---------+---------------+---------+-----------+----------+-----------------+ FV DistalFull                                                           +---------+---------------+---------+-----------+----------+-----------------+ PFV      Full                                                           +---------+---------------+---------+-----------+----------+-----------------+  POP      None           No       No                   Age Indeterminate +---------+---------------+---------+-----------+----------+-----------------+ PTV      None                                         Age Indeterminate +---------+---------------+---------+-----------+----------+-----------------+ PERO                                                  Not visualized    +---------+---------------+---------+-----------+----------+-----------------+ Gastroc  Full                                                           +---------+---------------+---------+-----------+----------+-----------------+ SSV      None                                         Age Indeterminate +---------+---------------+---------+-----------+----------+-----------------+    Summary: Right: Findings consistent with age indeterminate deep vein thrombosis involving the right peroneal veins. No cystic structure found in the popliteal fossa. Left: Findings consistent with age indeterminate deep vein thrombosis involving the left femoral vein, left popliteal vein, and left posterior tibial veins. Findings consistent with age indeterminate superficial vein thrombosis involving the left small sahenous vein. No cystic structure found in the popliteal fossa.  *See table(s) above for measurements and observations. Electronically signed by Deitra Mayo MD on 07/03/2019 at 11:01:52 AM.    Final      Subjective: No respiratory distress Stable hemodynamically  Discharge Exam: Vitals:   07/10/19 1952 07/11/19 0608  BP: 120/66 124/90  Pulse: 84 (!) 59  Resp: 20 20  Temp: 97.9 F (36.6 C) 97.6 F (36.4 C)  SpO2: 90% 92%   Vitals:   07/10/19 0801 07/10/19 1312 07/10/19 1952 07/11/19 0608  BP:  138/87 120/66 124/90  Pulse:  83 84 (!) 59  Resp:   20 20  Temp:   97.9 F (36.6 C) 97.6 F (36.4 C)  TempSrc:   Oral Oral  SpO2: 92% 97% 90% 92%  Weight:      Height:        General: Pt is alert, awake, not in acute distress Cardiovascular: RRR, S1/S2 +, no rubs, no gallops Respiratory: CTA bilaterally, no wheezing, no rhonchi Abdominal: Soft, NT, ND, bowel sounds + Extremities: no edema, no cyanosis    The results of significant diagnostics from this hospitalization (including imaging, microbiology, ancillary and laboratory) are listed below for reference.     Microbiology: Recent Results (from the past 240 hour(s))  Blood Culture (routine x 2)     Status: None   Collection Time: 07/02/19 12:03 PM   Specimen: BLOOD  Result Value Ref Range Status   Specimen Description   Final    BLOOD LEFT ANTECUBITAL Performed at [facility], [address],  [place] [address]    Special Requests   Final    BOTTLES DRAWN AEROBIC AND ANAEROBIC Blood Culture results may not be optimal due to an excessive volume of blood received in culture bottles Performed at [facility] [address] [place], [place] [address]    Culture   Final    NO GROWTH 5 DAYS Performed at [facility], [place] [address], [place], [place] [address]    Report Status 07/07/2019 FINAL  Final  Blood Culture (routine x 2)     Status: None   Collection Time: 07/02/19 12:04 PM   Specimen: BLOOD RIGHT HAND  Result Value Ref Range Status   Specimen Description   Final    BLOOD RIGHT HAND Performed at [facility] [address] [place], [place] [address]    Special Requests   Final    BOTTLES DRAWN AEROBIC ONLY Blood Culture results may not be optimal due to an inadequate volume of blood received in culture bottles Performed at [facility] [address] [place], [place] [address]    Culture   Final    NO GROWTH 5 DAYS Performed at [facility], [place] [address] [place], [place] [address]    Report Status 07/07/2019 FINAL  Final  SARS CORONAVIRUS 2 (TAT 6-24 HRS) Nasopharyngeal Nasopharyngeal Swab     Status: Abnormal   Collection Time: 07/02/19  5:27 PM   Specimen: Nasopharyngeal Swab  Result Value Ref Range Status   SARS Coronavirus 2 POSITIVE (A) NEGATIVE Final    Comment: RESULT CALLED TO, READ BACK BY AND VERIFIED WITH: A. BULLOCK,RN 0136 07/03/2019 T. TYSOR (NOTE) SARS-CoV-2 target nucleic acids are DETECTED. The SARS-CoV-2 RNA is generally detectable in upper and lower respiratory specimens during the acute phase of infection. Positive results are indicative of the presence of SARS-CoV-2 RNA. Clinical correlation with patient history and other diagnostic information is  necessary to determine patient infection status. Positive results do not rule out bacterial infection or co-infection with other viruses.  The expected result is Negative. Fact Sheet for Patients: SugarRoll.be Fact Sheet for Healthcare Providers: https://www.woods-mathews.com/ This test is not yet approved or cleared by the [place] FDA and  has been authorized for detection and/or diagnosis of SARS-CoV-2 by FDA under an Emergency Use Authorization (EUA). This EUA will remain  in effect (meaning this test can be used) for  the duration of the COVID-19 declaration under Section 564(b)(1) of the Act, 21 U.S.C. section 360bbb-3(b)(1), unless the authorization is terminated or revoked sooner. Performed at [facility], [place] [address], [place],  [address]   MRSA PCR Screening     Status: None   Collection Time: 07/03/19  6:16 PM   Specimen: Nasal Mucosa; Nasopharyngeal  Result Value Ref Range Status   MRSA by PCR NEGATIVE NEGATIVE Final    Comment:        The GeneXpert MRSA Assay (FDA approved for NASAL specimens only), is one component of a comprehensive MRSA colonization surveillance program. It is not intended to diagnose MRSA infection nor to guide or monitor treatment for MRSA infections. Performed at [facility], [place] [address] [place],  [address]      Labs: BNP (last 3 results) Recent Labs    07/02/19 2012  BNP [id number]   Basic Metabolic Panel: Recent Labs  Lab 07/05/19 0426 07/06/19 0353 07/07/19 0319  NA 137 136 135  K 4.4 4.4 4.4  CL 106 104 105  CO2 22 22 21*  GLUCOSE 132* 144* 147*  BUN 28* 31* 26*  CREATININE 0.89 1.01 0.84  CALCIUM 9.0 9.0 8.9   Liver Function Tests: Recent Labs  Lab 07/05/19 0426 07/06/19 0353 07/07/19 0319  AST 37 42* 34  ALT 42 52* 49*  ALKPHOS 52 51 48  BILITOT 0.8 0.7 0.8  PROT 6.9 6.8 6.7  ALBUMIN 3.1* 3.2* 3.2*   No results for input(s): LIPASE, AMYLASE in the last 168 hours. No results for input(s): AMMONIA in the last 168 hours. CBC: Recent Labs  Lab 07/05/19 0426 07/06/19 0353 07/07/19 0319 07/11/19 0319  WBC 8.9 9.1 10.1 13.4*  NEUTROABS 7.1 7.3 8.0*  --   HGB 13.3 13.3 13.6 15.0  HCT 41.5 41.0 43.4 46.5  MCV 95.6 94.3 96.4 95.1  PLT 455* 477* 488* 411*   Cardiac Enzymes: No results for input(s): CKTOTAL, CKMB, CKMBINDEX, TROPONINI in the last 168 hours. BNP: Invalid input(s): POCBNP CBG: No results for input(s): GLUCAP in the last 168 hours. D-Dimer No results for input(s): DDIMER in the last 72 hours. Hgb A1c No results for input(s): HGBA1C in the last 72 hours. Lipid Profile No results for input(s): CHOL, HDL, LDLCALC, TRIG, CHOLHDL, LDLDIRECT in the last 72 hours. Thyroid  function studies No results for input(s): TSH, T4TOTAL, T3FREE, THYROIDAB in the last 72 hours.  Invalid input(s): FREET3 Anemia work up No results for input(s): VITAMINB12, FOLATE, FERRITIN, TIBC, IRON, RETICCTPCT in the last 72 hours. Urinalysis    Component Value Date/Time   COLORURINE YELLOW 06/15/2018 0126   APPEARANCEUR CLEAR 06/15/2018 0126   LABSPEC 1.014 06/15/2018 0126   PHURINE 6.0 06/15/2018 0126   GLUCOSEU NEGATIVE 06/15/2018 0126   GLUCOSEU NEGATIVE 05/05/2018 1237   HGBUR SMALL (A) 06/15/2018 0126   HGBUR negative 04/19/2009 1004   BILIRUBINUR NEGATIVE 06/15/2018 0126   BILIRUBINUR n 04/28/2017 1426   KETONESUR 20 (A) 06/15/2018 0126   PROTEINUR NEGATIVE 06/15/2018 0126   UROBILINOGEN 0.2 05/05/2018 1237   NITRITE NEGATIVE 06/15/2018 0126   LEUKOCYTESUR NEGATIVE 06/15/2018 0126   Sepsis Labs Invalid input(s): PROCALCITONIN,  WBC,  LACTICIDVEN Microbiology Recent Results (from the past 240 hour(s))  Blood Culture (routine x 2)     Status: None   Collection Time: 07/02/19 12:03 PM   Specimen: BLOOD  Result Value Ref Range Status   Specimen Description   Final    BLOOD LEFT ANTECUBITAL Performed at [facility], [place] [address], [place], [place] [address]    Special Requests   Final    BOTTLES DRAWN AEROBIC AND ANAEROBIC Blood Culture results may not be optimal due to an excessive volume of blood received in culture bottles Performed at [facility] [address] [place], [place] [address]    Culture   Final    NO GROWTH 5 DAYS Performed at [facility], [place] [address], [place], [place] [address]    Report Status 07/07/2019 FINAL  Final  Blood Culture (routine x 2)     Status: None   Collection Time: 07/02/19 12:04 PM   Specimen: BLOOD RIGHT HAND  Result Value Ref Range Status   Specimen Description   Final    BLOOD RIGHT HAND Performed at [facility] [address], [place], [place] [address]     Special Requests   Final    BOTTLES DRAWN AEROBIC ONLY Blood Culture results may not be optimal due to an inadequate volume  of blood received in culture bottles Performed at [facility], [place] [address], [place], [place] [address]    Culture   Final    NO GROWTH 5 DAYS Performed at [facility], [place] [address] [place], [place] [address]    Report Status 07/07/2019 FINAL  Final  SARS CORONAVIRUS 2 (TAT 6-24 HRS) Nasopharyngeal Nasopharyngeal Swab     Status: Abnormal   Collection Time: 07/02/19  5:27 PM   Specimen: Nasopharyngeal Swab  Result Value Ref Range Status   SARS Coronavirus 2 POSITIVE (A) NEGATIVE Final    Comment: RESULT CALLED TO, READ BACK BY AND VERIFIED WITH: A. BULLOCK,RN 0136 07/03/2019 T. TYSOR (NOTE) SARS-CoV-2 target nucleic acids are DETECTED. The SARS-CoV-2 RNA is generally detectable in upper and lower respiratory specimens during the acute phase of infection. Positive results are indicative of the presence of SARS-CoV-2 RNA. Clinical correlation with patient history and other diagnostic information is  necessary to determine patient infection status. Positive results do not rule out bacterial infection or co-infection with other viruses.  The expected result is Negative. Fact Sheet for Patients: SugarRoll.be Fact Sheet for Healthcare Providers: https://www.woods-mathews.com/ This test is not yet approved or cleared by the [place] FDA and  has been authorized for detection and/or diagnosis of SARS-CoV-2 by FDA under an Emergency Use Authorization (EUA). This EUA will remain  in effect (meaning this test can be used) for  the duration of the COVID-19 declaration under Section 564(b)(1) of the Act, 21 U.S.C. section 360bbb-3(b)(1), unless the authorization is terminated or revoked sooner. Performed at [facility], [place] [address] [place], [place] [address]   MRSA PCR Screening      Status: None   Collection Time: 07/03/19  6:16 PM   Specimen: Nasal Mucosa; Nasopharyngeal  Result Value Ref Range Status   MRSA by PCR NEGATIVE NEGATIVE Final    Comment:        The GeneXpert MRSA Assay (FDA approved for NASAL specimens only), is one component of a comprehensive MRSA colonization surveillance program. It is not intended to diagnose MRSA infection nor to guide or monitor treatment for MRSA infections. Performed at [facility], [place] [address] [place], [place] [address]      Time coordinating discharge: Over 30 minutes  SIGNED:   Assunta Found, MD  [facility] 07/11/2019, 2:09 PM Pager   If 7PM-7AM, please contact night-coverage [email or web address] Password [email or web address]

## 2019-07-11 NOTE — TOC Progression Note (Signed)
Transition of Care [facility]) - Progression Note    Patient Details  Name: HEBER BRADFIELD MRN: [id number] Date of Birth: [birth year]  Transition of Care [facility]) CM/SW Contact  Purcell Mouton, RN Phone Number: 07/11/2019, 2:02 PM  Clinical Narrative:    Spoke with pt's son [name] who accepted bed at [facility] in [place]. Spoke with his brother Quintavis and wife Dr. Toma Deiters all agreed with [name] and asked that Delsa Sale be called with and Update from MD.          Expected Discharge Plan and Services                                                 Social Determinants of Health ([place]) Interventions    Readmission Risk Interventions No flowsheet data found.

## 2019-07-11 NOTE — Progress Notes (Signed)
Nutrition Follow-up  RD working remotely.   DOCUMENTATION CODES:   Not applicable  INTERVENTION:  - continue Ensure Enlive BID and Magic Cup BID. - continue to encourage PO intakes.    NUTRITION DIAGNOSIS:   Increased nutrient needs related to acute illness(COVID-19) as evidenced by estimated needs. -ongoing  GOAL:   Patient will meet greater than or equal to 90% of their needs -met on average  MONITOR:   PO intake, Supplement acceptance, Labs, Weight trends  ASSESSMENT:   78 y.o. male with medical history significant of dementia, essential HTN, CHF, hyperlipidemia, history of DVT off anticoagulation secondary to frequent falls, and CVA. He presented to the ED from [facility] with dyspnea, hypoxia, and reportedly elevated D-dimer.  Patient with known positive Covid-19 diagnosed on 12/24.  Patient is a/o to self only. Weight has been fairly stable since 1/4 (x1 week). Ensure Enlive is ordered BID and patient has accepted this supplement >90% of the time offered. Per flow sheet documentation, patient recently ate the following at meals:  1/8- 75% of breakfast, 50% of lunch, and 20% of dinner 1/9- 20% of dinner 1/10- 100% of breakfast and 60% of lunch  Per notes: - COVID-19 PNA - acute respiratory failure with hypoxia - acute bilateral DVT - questionable hx of aspiration/dysphagia--s/p SLP evaluation and is on aspiration precautions - weakness/debility    Labs reviewed; no CMP since 1/6. Medications reviewed; 500 mg ascorbic acid/day, 6 mg melatonin/day, daily multivitamin with minerals, 12.5 mg aldactone/day, 220 mg zinc sulfate/day.      NUTRITION - FOCUSED PHYSICAL EXAM:  unable to complete for COVID+ patient  Diet Order:   Diet Order            DIET DYS 3 Room service appropriate? No; Fluid consistency: Thin  Diet effective now              EDUCATION NEEDS:   No education needs have been identified at this time  Skin:  Skin Assessment: Reviewed RN  Assessment  Last BM:  PTA/unknown  Height:   Ht Readings from Last 1 Encounters:  07/02/19 5' 11" (1.803 m)    Weight:   Wt Readings from Last 1 Encounters:  07/09/19 83.8 kg    Ideal Body Weight:  78.2 kg  BMI:  Body mass index is 25.77 kg/m.  Estimated Nutritional Needs:   Kcal:  1950-2130 kcal  Protein:  90-100 grams  Fluid:  >/= 2. L/day      , MS, RD, LDN, CNSC Inpatient Clinical Dietitian Pager # [phone] After hours/weekend pager # [phone]  

## 2019-07-14 DIAGNOSIS — R69 Illness, unspecified: Secondary | ICD-10-CM | POA: Diagnosis not present

## 2019-07-14 DIAGNOSIS — R2681 Unsteadiness on feet: Secondary | ICD-10-CM | POA: Diagnosis not present

## 2019-07-14 DIAGNOSIS — I10 Essential (primary) hypertension: Secondary | ICD-10-CM | POA: Diagnosis not present

## 2019-07-14 DIAGNOSIS — U071 COVID-19: Secondary | ICD-10-CM | POA: Diagnosis not present

## 2019-07-30 DIAGNOSIS — G20 Parkinson's disease: Secondary | ICD-10-CM | POA: Diagnosis not present

## 2019-08-23 DIAGNOSIS — G20 Parkinson's disease: Secondary | ICD-10-CM | POA: Diagnosis not present

## 2019-08-25 DIAGNOSIS — E782 Mixed hyperlipidemia: Secondary | ICD-10-CM | POA: Diagnosis not present

## 2019-08-25 DIAGNOSIS — G9341 Metabolic encephalopathy: Secondary | ICD-10-CM | POA: Diagnosis not present

## 2019-08-25 DIAGNOSIS — R262 Difficulty in walking, not elsewhere classified: Secondary | ICD-10-CM | POA: Diagnosis not present

## 2019-08-25 DIAGNOSIS — R296 Repeated falls: Secondary | ICD-10-CM | POA: Diagnosis not present

## 2019-08-30 DIAGNOSIS — R69 Illness, unspecified: Secondary | ICD-10-CM | POA: Diagnosis not present

## 2019-09-07 ENCOUNTER — Encounter: Payer: Self-pay | Admitting: Neurology

## 2019-09-07 ENCOUNTER — Ambulatory Visit (INDEPENDENT_AMBULATORY_CARE_PROVIDER_SITE_OTHER): Payer: Medicare HMO | Admitting: Neurology

## 2019-09-07 ENCOUNTER — Other Ambulatory Visit: Payer: Self-pay

## 2019-09-07 VITALS — BP 112/60 | HR 66 | Temp 97.7°F | Ht 68.0 in | Wt 180.0 lb

## 2019-09-07 DIAGNOSIS — R69 Illness, unspecified: Secondary | ICD-10-CM | POA: Diagnosis not present

## 2019-09-07 DIAGNOSIS — E782 Mixed hyperlipidemia: Secondary | ICD-10-CM | POA: Diagnosis not present

## 2019-09-07 DIAGNOSIS — G20 Parkinson's disease: Secondary | ICD-10-CM

## 2019-09-07 DIAGNOSIS — U071 COVID-19: Secondary | ICD-10-CM | POA: Diagnosis not present

## 2019-09-07 DIAGNOSIS — I10 Essential (primary) hypertension: Secondary | ICD-10-CM | POA: Diagnosis not present

## 2019-09-07 NOTE — Progress Notes (Signed)
Subjective:    Patient ID: Timothy Duncan is a 79 y.o. male.  HPI     Star Age, MD, PhD [facility] [address] [place], [place] [address]  Dear Dr. 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  The patient is accompanied by his son today. The patient is currently in inpatient rehab at [facility] in [place]. As you know, Timothy Duncan is a 79 year old right-handed gentleman with an underlying medical history of TIA, DVT, hypertension, PE, hospital admission in January for Covid infection, stroke, dementia, congestive heart failure, frequent falls, who was noted to have slowness and stiffness, concern for parkinsonism.  He has been on Sinemet 25-100 mg strength 1 pill 4 times daily. The patient is not able to give any significant portion of the history.  He is minimally verbal.  He does respond to simple questions appropriately but only with 1 or 2 word sentences.  Patient does not believe he has noticed any changes lately since starting the Parkinson's medication.  He denies any history of tremors.  There is no obvious family history of Parkinson's disease.  Patient son reports that he has not been able to see the patient since Thanksgiving.  However, there have been frequent falls and there has been a change in the patient's voice which used to be very loud and authoritative and is now very weak.  He had a brain MRI without contrast on 03/31/2016 and I reviewed the results: IMPRESSION: Acute 5 mm nonhemorrhagic infarction involving the RIGHT paramedian pons. One, and possibly two other areas of subacute infarction in the corpus callosum and cerebellum. Considerations would include multiple lacunes, versus shower of emboli.   Extensive atrophy and small vessel disease with evidence for both chronic lacunar infarcts and chronic hemorrhage, likely  sequelae of hypertensive cerebrovascular disease.  His Past Medical History Is Significant For: Past Medical History:  Diagnosis Date  . DVT (deep venous thrombosis) ([place]) 07/2015   RLE  . ED (erectile dysfunction)   . Hypertension   . Pulmonary embolism ([place])   . TIA (transient ischemic attack)     His Past Surgical History Is Significant For: Past Surgical History:  Procedure Laterality Date  . APPENDECTOMY    . CATARACT EXTRACTION W/ INTRAOCULAR LENS  IMPLANT, BILATERAL Bilateral   . COLONOSCOPY    . LUMBAR LAMINECTOMY/DECOMPRESSION MICRODISCECTOMY Right 05/03/2015   Procedure: Microdiscectomy - Lumbar four-Lumbar five - right ;  Surgeon: Eustace Moore, MD;  Location: [place] [facility];  Service: Neurosurgery;  Laterality: Right;  . TONSILLECTOMY    . WOUND EXPLORATION N/A 06/06/2015   Procedure: Presumed discitis, Lumbar exploration,Repeat discectomy Lumbar four-five with deep tissue culture;  Surgeon: Eustace Moore, MD;  Location: [place] [facility];  Service: Neurosurgery;  Laterality: N/A;    His Family History Is Significant For: Family History  Problem Relation Age of Onset  . Early death Father        MVA  . Alcohol abuse Maternal Grandfather     His Social History Is Significant For: Social History   Socioeconomic History  . Marital status: Divorced    Spouse name: Not on file  . Number of children: Not on file  . Years of education: Not on file  . Highest education level: Not on file  Occupational History  . Not on file  Tobacco Use  . Smoking  status: Former Smoker    Packs/day: 3.00    Years: 30.00    Pack years: 90.00    Types: Cigarettes  . Smokeless tobacco: Former Systems developer    Types: Snuff, Chew  . Tobacco comment: "quit smoking in the 1980s; used chew and snuff off and on, none for awhile"  Substance and Sexual Activity  . Alcohol use: No    Alcohol/week: 0.0 standard drinks    Comment: 03/31/2016 "no drinking since 06/22/2005"  . Drug use: No  . Sexual  activity: Not Currently    Partners: Male  Other Topics Concern  . Not on file  Social History Narrative  . Not on file   Social Determinants of Health   Financial Resource Strain:   . Difficulty of Paying Living Expenses: Not on file  Food Insecurity:   . Worried About Charity fundraiser in the Last Year: Not on file  . Ran Out of Food in the Last Year: Not on file  Transportation Needs:   . Lack of Transportation (Medical): Not on file  . Lack of Transportation (Non-Medical): Not on file  Physical Activity:   . Days of Exercise per Week: Not on file  . Minutes of Exercise per Session: Not on file  Stress:   . Feeling of Stress : Not on file  Social Connections:   . Frequency of Communication with Friends and Family: Not on file  . Frequency of Social Gatherings with Friends and Family: Not on file  . Attends Religious Services: Not on file  . Active Member of Clubs or Organizations: Not on file  . Attends Archivist Meetings: Not on file  . Marital Status: Not on file    His Allergies Are:  No Known Allergies:   His Current Medications Are:  Outpatient Encounter Medications as of 09/07/2019  Medication Sig  . apixaban (ELIQUIS) 5 MG TABS tablet Take 1 tablet (5 mg total) by mouth 2 (two) times daily.  Marland Kitchen ascorbic acid (VITAMIN C) 500 MG tablet Take 1 tablet (500 mg total) by mouth daily.  . bisacodyl (DULCOLAX) 5 MG EC tablet Take 1 tablet (5 mg total) by mouth daily as needed for moderate constipation.  . feeding supplement, ENSURE ENLIVE, (ENSURE ENLIVE) LIQD Take 237 mLs by mouth 2 (two) times daily between meals.  Marland Kitchen guaiFENesin-dextromethorphan (ROBITUSSIN DM) 100-10 MG/5ML syrup Take 10 mLs by mouth every 4 (four) hours as needed for cough.  . Ipratropium-Albuterol (COMBIVENT) 20-100 MCG/ACT AERS respimat Inhale 1 puff into the lungs every 6 (six) hours as needed for wheezing.  . Melatonin 3 MG TABS Take 2 tablets (6 mg total) by mouth at bedtime.  .  Multiple Vitamin (MULTIVITAMIN WITH MINERALS) TABS tablet Take 1 tablet by mouth daily.  . ondansetron (ZOFRAN) 4 MG tablet Take 1 tablet (4 mg total) by mouth every 6 (six) hours as needed for nausea.  Marland Kitchen senna-docusate (SENOKOT-S) 8.6-50 MG tablet Take 1 tablet by mouth at bedtime as needed for mild constipation.  Marland Kitchen SINEMET 25-100 MG tablet Take 1 tablet by mouth 4 (four) times daily.  Marland Kitchen zinc sulfate 220 (50 Zn) MG capsule Take 1 capsule (220 mg total) by mouth daily.  . carvedilol (COREG) 6.25 MG tablet Take 1 tablet (6.25 mg total) by mouth 2 (two) times daily with a meal.  . losartan (COZAAR) 25 MG tablet Take 1 tablet (25 mg total) by mouth daily.  Marland Kitchen spironolactone (ALDACTONE) 25 MG tablet Take 0.5 tablets (12.5 mg total)  by mouth daily.   No facility-administered encounter medications on file as of 09/07/2019.  :   Review of Systems:  Out of a complete 14 point review of systems, all are reviewed and negative with the exception of these symptoms as listed below:         Review of Systems  Neurological:       Here for evaluation for parkinson's. Reports trouble with ambulation and memory.     Objective:  Neurological Exam  Physical Exam Physical Examination:   Vitals:   09/07/19 1432  BP: 112/60  Pulse: 66  Temp: 97.7 F (36.5 C)   General Examination: The patient is a very pleasant 79 y.o. male in no acute distress.  HEENT: Normocephalic, atraumatic, pupils are equal, round and reactive to light.  He wears corrective eyeglasses, he has mild facial masking, he has moderate nuchal rigidity, extraocular tracking is moderately impaired with saccadic breakdown without nystagmus.  Hearing appears to be grossly intact.  Speech is hoarse sounding, moderately hypophonic, not obviously dysarthric.   Chest: is clear to auscultation without wheezing, rhonchi or crackles noted.  Heart: sounds are regular and normal without murmurs, rubs or gallops noted.   Abdomen: is soft,  non-tender and non-distended with normal bowel sounds appreciated on auscultation.  Extremities: There is no tremor.  Skin: is warm and dry with no trophic changes noted. Age-related changes are noted on the skin.   Musculoskeletal: exam reveals no obvious joint deformities, tenderness, joint swelling or erythema.  Neurologically:  Mental status: The patient is awake and alert, paying attention but is not able to give his history.  He is minimally verbal.  He is able to follow simple commands.  He is in a wheelchair. His memory is impaired.    Cranial nerves are as described above under HEENT exam. In addition, shoulder shrug is normal with left shoulder slightly higher than right.   Motor exam: Normal bulk, tone is possibly mildly increased in the upper extremities bilaterally, not noticeably increased in the lower extremities, no resting, postural or action tremor noted.  Fine motor skills are moderately to severely impaired globally with no lateralization noted including with finger taps, hand movements, and rapid alternating patting.  The patient did not bring a walker.  I did not feel it safe for him to stand or walk for me today.  Patient's son reports that according to the caretaker he does not walk.  Reflexes are 1+.  Sensory exam intact to light touch.  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  His history and examination are not telltale for idiopathic Parkinson's disease.  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  It is not fully clear if he has benefited from Sinemet, some patients have significant side effects from it or no benefit.  If you feel he has benefited from symptomatic treatment with Sinemet, he can continue with the current dose of Sinemet 1 pill 3 times daily or 4 times daily.   Supportive care and fall prevention are going to be  critical as you know.  I am not sure if he is able to walk with the therapist with a walker and supervision.  I talked to the patient and particularly his son at length today.  I advised him that supportive care will be important and that symptomatic medication in the form of Sinemet can be continued so long as he does not have any side effects and seems to benefit from it.  I would be happy to see him back on an as-needed basis.  I answered all their questions today and the patient and his son were in agreement. Thank you very much for allowing me to participate in the care of this nice patient. If I can be of any further assistance to you please do not hesitate to call me at [phone].  Sincerely,   Star Age, MD, PhD

## 2019-09-07 NOTE — Patient Instructions (Signed)
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  I am not sure if you have actually benefited from the Sinemet which is the Parkinson's medication.  If Dr. Ronne Binning has noticed benefit from utilizing the Sinemet, you can continue with it.  I do not see any telltale signs of actual Parkinson's disease.

## 2019-09-19 DIAGNOSIS — G20 Parkinson's disease: Secondary | ICD-10-CM | POA: Diagnosis not present

## 2019-09-24 DIAGNOSIS — R69 Illness, unspecified: Secondary | ICD-10-CM | POA: Diagnosis not present

## 2019-09-29 DIAGNOSIS — U071 COVID-19: Secondary | ICD-10-CM | POA: Diagnosis not present

## 2019-09-29 DIAGNOSIS — M6281 Muscle weakness (generalized): Secondary | ICD-10-CM | POA: Diagnosis not present

## 2019-09-29 DIAGNOSIS — R69 Illness, unspecified: Secondary | ICD-10-CM | POA: Diagnosis not present

## 2019-10-04 DIAGNOSIS — R69 Illness, unspecified: Secondary | ICD-10-CM | POA: Diagnosis not present

## 2019-10-10 DIAGNOSIS — G20 Parkinson's disease: Secondary | ICD-10-CM | POA: Diagnosis not present

## 2019-10-10 DIAGNOSIS — R69 Illness, unspecified: Secondary | ICD-10-CM | POA: Diagnosis not present

## 2019-10-10 DIAGNOSIS — I10 Essential (primary) hypertension: Secondary | ICD-10-CM | POA: Diagnosis not present

## 2019-10-10 DIAGNOSIS — R2681 Unsteadiness on feet: Secondary | ICD-10-CM | POA: Diagnosis not present

## 2019-11-03 DIAGNOSIS — R69 Illness, unspecified: Secondary | ICD-10-CM | POA: Diagnosis not present

## 2019-11-03 DIAGNOSIS — F339 Major depressive disorder, recurrent, unspecified: Secondary | ICD-10-CM | POA: Diagnosis not present

## 2019-11-03 DIAGNOSIS — G4700 Insomnia, unspecified: Secondary | ICD-10-CM | POA: Diagnosis not present

## 2019-11-07 DIAGNOSIS — R69 Illness, unspecified: Secondary | ICD-10-CM | POA: Diagnosis not present

## 2019-11-07 DIAGNOSIS — R2681 Unsteadiness on feet: Secondary | ICD-10-CM | POA: Diagnosis not present

## 2019-11-07 DIAGNOSIS — G20 Parkinson's disease: Secondary | ICD-10-CM | POA: Diagnosis not present

## 2019-11-21 DIAGNOSIS — R69 Illness, unspecified: Secondary | ICD-10-CM | POA: Diagnosis not present

## 2019-11-21 DIAGNOSIS — I5022 Chronic systolic (congestive) heart failure: Secondary | ICD-10-CM | POA: Diagnosis not present

## 2019-11-21 DIAGNOSIS — I10 Essential (primary) hypertension: Secondary | ICD-10-CM | POA: Diagnosis not present

## 2019-11-21 DIAGNOSIS — G20 Parkinson's disease: Secondary | ICD-10-CM | POA: Diagnosis not present

## 2019-12-01 DIAGNOSIS — F339 Major depressive disorder, recurrent, unspecified: Secondary | ICD-10-CM | POA: Diagnosis not present

## 2019-12-01 DIAGNOSIS — R69 Illness, unspecified: Secondary | ICD-10-CM | POA: Diagnosis not present

## 2019-12-01 DIAGNOSIS — G4700 Insomnia, unspecified: Secondary | ICD-10-CM | POA: Diagnosis not present

## 2019-12-01 DIAGNOSIS — G20 Parkinson's disease: Secondary | ICD-10-CM | POA: Diagnosis not present

## 2019-12-05 DIAGNOSIS — I10 Essential (primary) hypertension: Secondary | ICD-10-CM | POA: Diagnosis not present

## 2019-12-05 DIAGNOSIS — R262 Difficulty in walking, not elsewhere classified: Secondary | ICD-10-CM | POA: Diagnosis not present

## 2019-12-05 DIAGNOSIS — R296 Repeated falls: Secondary | ICD-10-CM | POA: Diagnosis not present

## 2019-12-05 DIAGNOSIS — G20 Parkinson's disease: Secondary | ICD-10-CM | POA: Diagnosis not present

## 2019-12-15 DIAGNOSIS — F0634 Mood disorder due to known physiological condition with mixed features: Secondary | ICD-10-CM | POA: Diagnosis not present

## 2019-12-15 DIAGNOSIS — F339 Major depressive disorder, recurrent, unspecified: Secondary | ICD-10-CM | POA: Diagnosis not present

## 2019-12-15 DIAGNOSIS — R69 Illness, unspecified: Secondary | ICD-10-CM | POA: Diagnosis not present

## 2019-12-15 DIAGNOSIS — G4700 Insomnia, unspecified: Secondary | ICD-10-CM | POA: Diagnosis not present

## 2019-12-27 DIAGNOSIS — H26493 Other secondary cataract, bilateral: Secondary | ICD-10-CM | POA: Diagnosis not present

## 2019-12-27 DIAGNOSIS — Z7901 Long term (current) use of anticoagulants: Secondary | ICD-10-CM | POA: Diagnosis not present

## 2019-12-27 DIAGNOSIS — Z961 Presence of intraocular lens: Secondary | ICD-10-CM | POA: Diagnosis not present

## 2019-12-27 DIAGNOSIS — H524 Presbyopia: Secondary | ICD-10-CM | POA: Diagnosis not present

## 2019-12-27 DIAGNOSIS — H04123 Dry eye syndrome of bilateral lacrimal glands: Secondary | ICD-10-CM | POA: Diagnosis not present

## 2020-01-05 DIAGNOSIS — G20 Parkinson's disease: Secondary | ICD-10-CM | POA: Diagnosis not present

## 2020-01-05 DIAGNOSIS — G4700 Insomnia, unspecified: Secondary | ICD-10-CM | POA: Diagnosis not present

## 2020-01-05 DIAGNOSIS — F339 Major depressive disorder, recurrent, unspecified: Secondary | ICD-10-CM | POA: Diagnosis not present

## 2020-01-05 DIAGNOSIS — F0634 Mood disorder due to known physiological condition with mixed features: Secondary | ICD-10-CM | POA: Diagnosis not present

## 2020-01-05 DIAGNOSIS — R69 Illness, unspecified: Secondary | ICD-10-CM | POA: Diagnosis not present

## 2020-01-12 DIAGNOSIS — G20 Parkinson's disease: Secondary | ICD-10-CM | POA: Diagnosis not present

## 2020-01-12 DIAGNOSIS — R262 Difficulty in walking, not elsewhere classified: Secondary | ICD-10-CM | POA: Diagnosis not present

## 2020-01-12 DIAGNOSIS — R296 Repeated falls: Secondary | ICD-10-CM | POA: Diagnosis not present

## 2020-01-12 DIAGNOSIS — I10 Essential (primary) hypertension: Secondary | ICD-10-CM | POA: Diagnosis not present

## 2020-01-24 DIAGNOSIS — R262 Difficulty in walking, not elsewhere classified: Secondary | ICD-10-CM | POA: Diagnosis not present

## 2020-01-24 DIAGNOSIS — R296 Repeated falls: Secondary | ICD-10-CM | POA: Diagnosis not present

## 2020-01-24 DIAGNOSIS — R69 Illness, unspecified: Secondary | ICD-10-CM | POA: Diagnosis not present

## 2020-01-24 DIAGNOSIS — R627 Adult failure to thrive: Secondary | ICD-10-CM | POA: Diagnosis not present

## 2020-02-09 DIAGNOSIS — G20 Parkinson's disease: Secondary | ICD-10-CM | POA: Diagnosis not present

## 2020-02-09 DIAGNOSIS — F0634 Mood disorder due to known physiological condition with mixed features: Secondary | ICD-10-CM | POA: Diagnosis not present

## 2020-02-09 DIAGNOSIS — F339 Major depressive disorder, recurrent, unspecified: Secondary | ICD-10-CM | POA: Diagnosis not present

## 2020-02-09 DIAGNOSIS — R69 Illness, unspecified: Secondary | ICD-10-CM | POA: Diagnosis not present

## 2020-02-09 DIAGNOSIS — G4700 Insomnia, unspecified: Secondary | ICD-10-CM | POA: Diagnosis not present

## 2020-02-10 DIAGNOSIS — R69 Illness, unspecified: Secondary | ICD-10-CM | POA: Diagnosis not present

## 2020-02-10 DIAGNOSIS — I5022 Chronic systolic (congestive) heart failure: Secondary | ICD-10-CM | POA: Diagnosis not present

## 2020-02-10 DIAGNOSIS — R627 Adult failure to thrive: Secondary | ICD-10-CM | POA: Diagnosis not present

## 2020-02-10 DIAGNOSIS — R296 Repeated falls: Secondary | ICD-10-CM | POA: Diagnosis not present

## 2020-02-21 DIAGNOSIS — R1312 Dysphagia, oropharyngeal phase: Secondary | ICD-10-CM | POA: Diagnosis not present

## 2020-02-21 DIAGNOSIS — G20 Parkinson's disease: Secondary | ICD-10-CM | POA: Diagnosis not present

## 2020-02-21 DIAGNOSIS — R69 Illness, unspecified: Secondary | ICD-10-CM | POA: Diagnosis not present

## 2020-02-21 DIAGNOSIS — M6281 Muscle weakness (generalized): Secondary | ICD-10-CM | POA: Diagnosis not present

## 2020-02-21 DIAGNOSIS — I6990 Unspecified sequelae of unspecified cerebrovascular disease: Secondary | ICD-10-CM | POA: Diagnosis not present

## 2020-02-22 DIAGNOSIS — M6281 Muscle weakness (generalized): Secondary | ICD-10-CM | POA: Diagnosis not present

## 2020-02-22 DIAGNOSIS — G20 Parkinson's disease: Secondary | ICD-10-CM | POA: Diagnosis not present

## 2020-02-22 DIAGNOSIS — R1312 Dysphagia, oropharyngeal phase: Secondary | ICD-10-CM | POA: Diagnosis not present

## 2020-02-22 DIAGNOSIS — R69 Illness, unspecified: Secondary | ICD-10-CM | POA: Diagnosis not present

## 2020-02-22 DIAGNOSIS — I6990 Unspecified sequelae of unspecified cerebrovascular disease: Secondary | ICD-10-CM | POA: Diagnosis not present

## 2020-02-23 DIAGNOSIS — I6990 Unspecified sequelae of unspecified cerebrovascular disease: Secondary | ICD-10-CM | POA: Diagnosis not present

## 2020-02-23 DIAGNOSIS — M6281 Muscle weakness (generalized): Secondary | ICD-10-CM | POA: Diagnosis not present

## 2020-02-23 DIAGNOSIS — G20 Parkinson's disease: Secondary | ICD-10-CM | POA: Diagnosis not present

## 2020-02-23 DIAGNOSIS — R1312 Dysphagia, oropharyngeal phase: Secondary | ICD-10-CM | POA: Diagnosis not present

## 2020-02-23 DIAGNOSIS — R69 Illness, unspecified: Secondary | ICD-10-CM | POA: Diagnosis not present

## 2020-02-24 DIAGNOSIS — I6990 Unspecified sequelae of unspecified cerebrovascular disease: Secondary | ICD-10-CM | POA: Diagnosis not present

## 2020-02-24 DIAGNOSIS — M6281 Muscle weakness (generalized): Secondary | ICD-10-CM | POA: Diagnosis not present

## 2020-02-24 DIAGNOSIS — R69 Illness, unspecified: Secondary | ICD-10-CM | POA: Diagnosis not present

## 2020-02-24 DIAGNOSIS — R1312 Dysphagia, oropharyngeal phase: Secondary | ICD-10-CM | POA: Diagnosis not present

## 2020-02-24 DIAGNOSIS — G20 Parkinson's disease: Secondary | ICD-10-CM | POA: Diagnosis not present

## 2020-02-27 DIAGNOSIS — I6990 Unspecified sequelae of unspecified cerebrovascular disease: Secondary | ICD-10-CM | POA: Diagnosis not present

## 2020-02-27 DIAGNOSIS — R1312 Dysphagia, oropharyngeal phase: Secondary | ICD-10-CM | POA: Diagnosis not present

## 2020-02-27 DIAGNOSIS — G20 Parkinson's disease: Secondary | ICD-10-CM | POA: Diagnosis not present

## 2020-02-27 DIAGNOSIS — M6281 Muscle weakness (generalized): Secondary | ICD-10-CM | POA: Diagnosis not present

## 2020-02-27 DIAGNOSIS — R69 Illness, unspecified: Secondary | ICD-10-CM | POA: Diagnosis not present

## 2020-02-28 DIAGNOSIS — R1312 Dysphagia, oropharyngeal phase: Secondary | ICD-10-CM | POA: Diagnosis not present

## 2020-02-28 DIAGNOSIS — M6281 Muscle weakness (generalized): Secondary | ICD-10-CM | POA: Diagnosis not present

## 2020-02-28 DIAGNOSIS — G20 Parkinson's disease: Secondary | ICD-10-CM | POA: Diagnosis not present

## 2020-02-28 DIAGNOSIS — I6990 Unspecified sequelae of unspecified cerebrovascular disease: Secondary | ICD-10-CM | POA: Diagnosis not present

## 2020-02-28 DIAGNOSIS — R69 Illness, unspecified: Secondary | ICD-10-CM | POA: Diagnosis not present

## 2020-02-29 DIAGNOSIS — M24561 Contracture, right knee: Secondary | ICD-10-CM | POA: Diagnosis not present

## 2020-02-29 DIAGNOSIS — G20 Parkinson's disease: Secondary | ICD-10-CM | POA: Diagnosis not present

## 2020-02-29 DIAGNOSIS — I6990 Unspecified sequelae of unspecified cerebrovascular disease: Secondary | ICD-10-CM | POA: Diagnosis not present

## 2020-02-29 DIAGNOSIS — R69 Illness, unspecified: Secondary | ICD-10-CM | POA: Diagnosis not present

## 2020-02-29 DIAGNOSIS — M6281 Muscle weakness (generalized): Secondary | ICD-10-CM | POA: Diagnosis not present

## 2020-02-29 DIAGNOSIS — R1312 Dysphagia, oropharyngeal phase: Secondary | ICD-10-CM | POA: Diagnosis not present

## 2020-03-01 DIAGNOSIS — M24561 Contracture, right knee: Secondary | ICD-10-CM | POA: Diagnosis not present

## 2020-03-01 DIAGNOSIS — I6990 Unspecified sequelae of unspecified cerebrovascular disease: Secondary | ICD-10-CM | POA: Diagnosis not present

## 2020-03-01 DIAGNOSIS — G20 Parkinson's disease: Secondary | ICD-10-CM | POA: Diagnosis not present

## 2020-03-01 DIAGNOSIS — R1312 Dysphagia, oropharyngeal phase: Secondary | ICD-10-CM | POA: Diagnosis not present

## 2020-03-01 DIAGNOSIS — R69 Illness, unspecified: Secondary | ICD-10-CM | POA: Diagnosis not present

## 2020-03-01 DIAGNOSIS — M6281 Muscle weakness (generalized): Secondary | ICD-10-CM | POA: Diagnosis not present

## 2020-03-02 DIAGNOSIS — R69 Illness, unspecified: Secondary | ICD-10-CM | POA: Diagnosis not present

## 2020-03-02 DIAGNOSIS — M24561 Contracture, right knee: Secondary | ICD-10-CM | POA: Diagnosis not present

## 2020-03-02 DIAGNOSIS — G20 Parkinson's disease: Secondary | ICD-10-CM | POA: Diagnosis not present

## 2020-03-02 DIAGNOSIS — R1312 Dysphagia, oropharyngeal phase: Secondary | ICD-10-CM | POA: Diagnosis not present

## 2020-03-02 DIAGNOSIS — I6990 Unspecified sequelae of unspecified cerebrovascular disease: Secondary | ICD-10-CM | POA: Diagnosis not present

## 2020-03-02 DIAGNOSIS — M6281 Muscle weakness (generalized): Secondary | ICD-10-CM | POA: Diagnosis not present

## 2020-03-04 DIAGNOSIS — R69 Illness, unspecified: Secondary | ICD-10-CM | POA: Diagnosis not present

## 2020-03-04 DIAGNOSIS — I6990 Unspecified sequelae of unspecified cerebrovascular disease: Secondary | ICD-10-CM | POA: Diagnosis not present

## 2020-03-04 DIAGNOSIS — G20 Parkinson's disease: Secondary | ICD-10-CM | POA: Diagnosis not present

## 2020-03-04 DIAGNOSIS — M6281 Muscle weakness (generalized): Secondary | ICD-10-CM | POA: Diagnosis not present

## 2020-03-04 DIAGNOSIS — M24561 Contracture, right knee: Secondary | ICD-10-CM | POA: Diagnosis not present

## 2020-03-04 DIAGNOSIS — R1312 Dysphagia, oropharyngeal phase: Secondary | ICD-10-CM | POA: Diagnosis not present

## 2020-03-06 DIAGNOSIS — M24561 Contracture, right knee: Secondary | ICD-10-CM | POA: Diagnosis not present

## 2020-03-06 DIAGNOSIS — R69 Illness, unspecified: Secondary | ICD-10-CM | POA: Diagnosis not present

## 2020-03-06 DIAGNOSIS — I6990 Unspecified sequelae of unspecified cerebrovascular disease: Secondary | ICD-10-CM | POA: Diagnosis not present

## 2020-03-06 DIAGNOSIS — M6281 Muscle weakness (generalized): Secondary | ICD-10-CM | POA: Diagnosis not present

## 2020-03-06 DIAGNOSIS — R1312 Dysphagia, oropharyngeal phase: Secondary | ICD-10-CM | POA: Diagnosis not present

## 2020-03-06 DIAGNOSIS — G20 Parkinson's disease: Secondary | ICD-10-CM | POA: Diagnosis not present

## 2020-03-07 DIAGNOSIS — I5022 Chronic systolic (congestive) heart failure: Secondary | ICD-10-CM | POA: Diagnosis not present

## 2020-03-07 DIAGNOSIS — M24561 Contracture, right knee: Secondary | ICD-10-CM | POA: Diagnosis not present

## 2020-03-07 DIAGNOSIS — M6281 Muscle weakness (generalized): Secondary | ICD-10-CM | POA: Diagnosis not present

## 2020-03-07 DIAGNOSIS — G20 Parkinson's disease: Secondary | ICD-10-CM | POA: Diagnosis not present

## 2020-03-07 DIAGNOSIS — I10 Essential (primary) hypertension: Secondary | ICD-10-CM | POA: Diagnosis not present

## 2020-03-07 DIAGNOSIS — R296 Repeated falls: Secondary | ICD-10-CM | POA: Diagnosis not present

## 2020-03-07 DIAGNOSIS — I6990 Unspecified sequelae of unspecified cerebrovascular disease: Secondary | ICD-10-CM | POA: Diagnosis not present

## 2020-03-07 DIAGNOSIS — R1312 Dysphagia, oropharyngeal phase: Secondary | ICD-10-CM | POA: Diagnosis not present

## 2020-03-07 DIAGNOSIS — R69 Illness, unspecified: Secondary | ICD-10-CM | POA: Diagnosis not present

## 2020-03-08 DIAGNOSIS — G20 Parkinson's disease: Secondary | ICD-10-CM | POA: Diagnosis not present

## 2020-03-08 DIAGNOSIS — I6990 Unspecified sequelae of unspecified cerebrovascular disease: Secondary | ICD-10-CM | POA: Diagnosis not present

## 2020-03-08 DIAGNOSIS — F339 Major depressive disorder, recurrent, unspecified: Secondary | ICD-10-CM | POA: Diagnosis not present

## 2020-03-08 DIAGNOSIS — R1312 Dysphagia, oropharyngeal phase: Secondary | ICD-10-CM | POA: Diagnosis not present

## 2020-03-08 DIAGNOSIS — R69 Illness, unspecified: Secondary | ICD-10-CM | POA: Diagnosis not present

## 2020-03-08 DIAGNOSIS — M24561 Contracture, right knee: Secondary | ICD-10-CM | POA: Diagnosis not present

## 2020-03-08 DIAGNOSIS — M6281 Muscle weakness (generalized): Secondary | ICD-10-CM | POA: Diagnosis not present

## 2020-03-08 DIAGNOSIS — G4700 Insomnia, unspecified: Secondary | ICD-10-CM | POA: Diagnosis not present

## 2020-03-08 DIAGNOSIS — F0634 Mood disorder due to known physiological condition with mixed features: Secondary | ICD-10-CM | POA: Diagnosis not present

## 2020-03-09 DIAGNOSIS — M6281 Muscle weakness (generalized): Secondary | ICD-10-CM | POA: Diagnosis not present

## 2020-03-09 DIAGNOSIS — R69 Illness, unspecified: Secondary | ICD-10-CM | POA: Diagnosis not present

## 2020-03-09 DIAGNOSIS — G20 Parkinson's disease: Secondary | ICD-10-CM | POA: Diagnosis not present

## 2020-03-09 DIAGNOSIS — M24561 Contracture, right knee: Secondary | ICD-10-CM | POA: Diagnosis not present

## 2020-03-09 DIAGNOSIS — I6990 Unspecified sequelae of unspecified cerebrovascular disease: Secondary | ICD-10-CM | POA: Diagnosis not present

## 2020-03-09 DIAGNOSIS — R1312 Dysphagia, oropharyngeal phase: Secondary | ICD-10-CM | POA: Diagnosis not present

## 2020-03-12 DIAGNOSIS — R69 Illness, unspecified: Secondary | ICD-10-CM | POA: Diagnosis not present

## 2020-03-12 DIAGNOSIS — I6990 Unspecified sequelae of unspecified cerebrovascular disease: Secondary | ICD-10-CM | POA: Diagnosis not present

## 2020-03-12 DIAGNOSIS — M24561 Contracture, right knee: Secondary | ICD-10-CM | POA: Diagnosis not present

## 2020-03-12 DIAGNOSIS — R1312 Dysphagia, oropharyngeal phase: Secondary | ICD-10-CM | POA: Diagnosis not present

## 2020-03-12 DIAGNOSIS — M6281 Muscle weakness (generalized): Secondary | ICD-10-CM | POA: Diagnosis not present

## 2020-03-12 DIAGNOSIS — G20 Parkinson's disease: Secondary | ICD-10-CM | POA: Diagnosis not present

## 2020-03-13 DIAGNOSIS — R1312 Dysphagia, oropharyngeal phase: Secondary | ICD-10-CM | POA: Diagnosis not present

## 2020-03-13 DIAGNOSIS — I6990 Unspecified sequelae of unspecified cerebrovascular disease: Secondary | ICD-10-CM | POA: Diagnosis not present

## 2020-03-13 DIAGNOSIS — M24561 Contracture, right knee: Secondary | ICD-10-CM | POA: Diagnosis not present

## 2020-03-13 DIAGNOSIS — M6281 Muscle weakness (generalized): Secondary | ICD-10-CM | POA: Diagnosis not present

## 2020-03-13 DIAGNOSIS — R69 Illness, unspecified: Secondary | ICD-10-CM | POA: Diagnosis not present

## 2020-03-13 DIAGNOSIS — G20 Parkinson's disease: Secondary | ICD-10-CM | POA: Diagnosis not present

## 2020-03-14 DIAGNOSIS — R1312 Dysphagia, oropharyngeal phase: Secondary | ICD-10-CM | POA: Diagnosis not present

## 2020-03-14 DIAGNOSIS — M24561 Contracture, right knee: Secondary | ICD-10-CM | POA: Diagnosis not present

## 2020-03-14 DIAGNOSIS — R69 Illness, unspecified: Secondary | ICD-10-CM | POA: Diagnosis not present

## 2020-03-14 DIAGNOSIS — G20 Parkinson's disease: Secondary | ICD-10-CM | POA: Diagnosis not present

## 2020-03-14 DIAGNOSIS — M6281 Muscle weakness (generalized): Secondary | ICD-10-CM | POA: Diagnosis not present

## 2020-03-14 DIAGNOSIS — I6990 Unspecified sequelae of unspecified cerebrovascular disease: Secondary | ICD-10-CM | POA: Diagnosis not present

## 2020-03-15 DIAGNOSIS — R1312 Dysphagia, oropharyngeal phase: Secondary | ICD-10-CM | POA: Diagnosis not present

## 2020-03-15 DIAGNOSIS — M6281 Muscle weakness (generalized): Secondary | ICD-10-CM | POA: Diagnosis not present

## 2020-03-15 DIAGNOSIS — G20 Parkinson's disease: Secondary | ICD-10-CM | POA: Diagnosis not present

## 2020-03-15 DIAGNOSIS — M24561 Contracture, right knee: Secondary | ICD-10-CM | POA: Diagnosis not present

## 2020-03-15 DIAGNOSIS — I6990 Unspecified sequelae of unspecified cerebrovascular disease: Secondary | ICD-10-CM | POA: Diagnosis not present

## 2020-03-15 DIAGNOSIS — R69 Illness, unspecified: Secondary | ICD-10-CM | POA: Diagnosis not present

## 2020-03-16 DIAGNOSIS — I6990 Unspecified sequelae of unspecified cerebrovascular disease: Secondary | ICD-10-CM | POA: Diagnosis not present

## 2020-03-16 DIAGNOSIS — M6281 Muscle weakness (generalized): Secondary | ICD-10-CM | POA: Diagnosis not present

## 2020-03-16 DIAGNOSIS — R69 Illness, unspecified: Secondary | ICD-10-CM | POA: Diagnosis not present

## 2020-03-16 DIAGNOSIS — R1312 Dysphagia, oropharyngeal phase: Secondary | ICD-10-CM | POA: Diagnosis not present

## 2020-03-16 DIAGNOSIS — G20 Parkinson's disease: Secondary | ICD-10-CM | POA: Diagnosis not present

## 2020-03-16 DIAGNOSIS — M24561 Contracture, right knee: Secondary | ICD-10-CM | POA: Diagnosis not present

## 2020-03-19 DIAGNOSIS — G20 Parkinson's disease: Secondary | ICD-10-CM | POA: Diagnosis not present

## 2020-03-19 DIAGNOSIS — R69 Illness, unspecified: Secondary | ICD-10-CM | POA: Diagnosis not present

## 2020-03-19 DIAGNOSIS — I6990 Unspecified sequelae of unspecified cerebrovascular disease: Secondary | ICD-10-CM | POA: Diagnosis not present

## 2020-03-19 DIAGNOSIS — M6281 Muscle weakness (generalized): Secondary | ICD-10-CM | POA: Diagnosis not present

## 2020-03-19 DIAGNOSIS — R1312 Dysphagia, oropharyngeal phase: Secondary | ICD-10-CM | POA: Diagnosis not present

## 2020-03-19 DIAGNOSIS — M24561 Contracture, right knee: Secondary | ICD-10-CM | POA: Diagnosis not present

## 2020-03-20 DIAGNOSIS — I6990 Unspecified sequelae of unspecified cerebrovascular disease: Secondary | ICD-10-CM | POA: Diagnosis not present

## 2020-03-20 DIAGNOSIS — R69 Illness, unspecified: Secondary | ICD-10-CM | POA: Diagnosis not present

## 2020-03-20 DIAGNOSIS — R1312 Dysphagia, oropharyngeal phase: Secondary | ICD-10-CM | POA: Diagnosis not present

## 2020-03-20 DIAGNOSIS — G20 Parkinson's disease: Secondary | ICD-10-CM | POA: Diagnosis not present

## 2020-03-20 DIAGNOSIS — M24561 Contracture, right knee: Secondary | ICD-10-CM | POA: Diagnosis not present

## 2020-03-20 DIAGNOSIS — M6281 Muscle weakness (generalized): Secondary | ICD-10-CM | POA: Diagnosis not present

## 2020-03-21 DIAGNOSIS — I6990 Unspecified sequelae of unspecified cerebrovascular disease: Secondary | ICD-10-CM | POA: Diagnosis not present

## 2020-03-21 DIAGNOSIS — M6281 Muscle weakness (generalized): Secondary | ICD-10-CM | POA: Diagnosis not present

## 2020-03-21 DIAGNOSIS — G20 Parkinson's disease: Secondary | ICD-10-CM | POA: Diagnosis not present

## 2020-03-21 DIAGNOSIS — M24561 Contracture, right knee: Secondary | ICD-10-CM | POA: Diagnosis not present

## 2020-03-21 DIAGNOSIS — R69 Illness, unspecified: Secondary | ICD-10-CM | POA: Diagnosis not present

## 2020-03-21 DIAGNOSIS — R1312 Dysphagia, oropharyngeal phase: Secondary | ICD-10-CM | POA: Diagnosis not present

## 2020-03-22 DIAGNOSIS — R69 Illness, unspecified: Secondary | ICD-10-CM | POA: Diagnosis not present

## 2020-03-22 DIAGNOSIS — M24561 Contracture, right knee: Secondary | ICD-10-CM | POA: Diagnosis not present

## 2020-03-22 DIAGNOSIS — G20 Parkinson's disease: Secondary | ICD-10-CM | POA: Diagnosis not present

## 2020-03-22 DIAGNOSIS — R1312 Dysphagia, oropharyngeal phase: Secondary | ICD-10-CM | POA: Diagnosis not present

## 2020-03-22 DIAGNOSIS — I6990 Unspecified sequelae of unspecified cerebrovascular disease: Secondary | ICD-10-CM | POA: Diagnosis not present

## 2020-03-22 DIAGNOSIS — M6281 Muscle weakness (generalized): Secondary | ICD-10-CM | POA: Diagnosis not present

## 2020-03-23 DIAGNOSIS — R69 Illness, unspecified: Secondary | ICD-10-CM | POA: Diagnosis not present

## 2020-03-23 DIAGNOSIS — G20 Parkinson's disease: Secondary | ICD-10-CM | POA: Diagnosis not present

## 2020-03-23 DIAGNOSIS — R1312 Dysphagia, oropharyngeal phase: Secondary | ICD-10-CM | POA: Diagnosis not present

## 2020-03-23 DIAGNOSIS — M24561 Contracture, right knee: Secondary | ICD-10-CM | POA: Diagnosis not present

## 2020-03-23 DIAGNOSIS — I6990 Unspecified sequelae of unspecified cerebrovascular disease: Secondary | ICD-10-CM | POA: Diagnosis not present

## 2020-03-23 DIAGNOSIS — M6281 Muscle weakness (generalized): Secondary | ICD-10-CM | POA: Diagnosis not present

## 2020-03-26 DIAGNOSIS — R69 Illness, unspecified: Secondary | ICD-10-CM | POA: Diagnosis not present

## 2020-03-26 DIAGNOSIS — M24561 Contracture, right knee: Secondary | ICD-10-CM | POA: Diagnosis not present

## 2020-03-26 DIAGNOSIS — R1312 Dysphagia, oropharyngeal phase: Secondary | ICD-10-CM | POA: Diagnosis not present

## 2020-03-26 DIAGNOSIS — M6281 Muscle weakness (generalized): Secondary | ICD-10-CM | POA: Diagnosis not present

## 2020-03-26 DIAGNOSIS — I6990 Unspecified sequelae of unspecified cerebrovascular disease: Secondary | ICD-10-CM | POA: Diagnosis not present

## 2020-03-26 DIAGNOSIS — G20 Parkinson's disease: Secondary | ICD-10-CM | POA: Diagnosis not present

## 2020-03-27 DIAGNOSIS — M6281 Muscle weakness (generalized): Secondary | ICD-10-CM | POA: Diagnosis not present

## 2020-03-27 DIAGNOSIS — M24561 Contracture, right knee: Secondary | ICD-10-CM | POA: Diagnosis not present

## 2020-03-27 DIAGNOSIS — R1312 Dysphagia, oropharyngeal phase: Secondary | ICD-10-CM | POA: Diagnosis not present

## 2020-03-27 DIAGNOSIS — G20 Parkinson's disease: Secondary | ICD-10-CM | POA: Diagnosis not present

## 2020-03-27 DIAGNOSIS — I6990 Unspecified sequelae of unspecified cerebrovascular disease: Secondary | ICD-10-CM | POA: Diagnosis not present

## 2020-03-27 DIAGNOSIS — R69 Illness, unspecified: Secondary | ICD-10-CM | POA: Diagnosis not present

## 2020-03-28 DIAGNOSIS — I6990 Unspecified sequelae of unspecified cerebrovascular disease: Secondary | ICD-10-CM | POA: Diagnosis not present

## 2020-03-28 DIAGNOSIS — R1312 Dysphagia, oropharyngeal phase: Secondary | ICD-10-CM | POA: Diagnosis not present

## 2020-03-28 DIAGNOSIS — G20 Parkinson's disease: Secondary | ICD-10-CM | POA: Diagnosis not present

## 2020-03-28 DIAGNOSIS — M6281 Muscle weakness (generalized): Secondary | ICD-10-CM | POA: Diagnosis not present

## 2020-03-28 DIAGNOSIS — M24561 Contracture, right knee: Secondary | ICD-10-CM | POA: Diagnosis not present

## 2020-03-28 DIAGNOSIS — R69 Illness, unspecified: Secondary | ICD-10-CM | POA: Diagnosis not present

## 2020-03-29 DIAGNOSIS — R69 Illness, unspecified: Secondary | ICD-10-CM | POA: Diagnosis not present

## 2020-03-29 DIAGNOSIS — G20 Parkinson's disease: Secondary | ICD-10-CM | POA: Diagnosis not present

## 2020-03-29 DIAGNOSIS — M24561 Contracture, right knee: Secondary | ICD-10-CM | POA: Diagnosis not present

## 2020-03-29 DIAGNOSIS — I6990 Unspecified sequelae of unspecified cerebrovascular disease: Secondary | ICD-10-CM | POA: Diagnosis not present

## 2020-03-29 DIAGNOSIS — R1312 Dysphagia, oropharyngeal phase: Secondary | ICD-10-CM | POA: Diagnosis not present

## 2020-03-29 DIAGNOSIS — M6281 Muscle weakness (generalized): Secondary | ICD-10-CM | POA: Diagnosis not present

## 2020-03-30 DIAGNOSIS — M6281 Muscle weakness (generalized): Secondary | ICD-10-CM | POA: Diagnosis not present

## 2020-03-30 DIAGNOSIS — R69 Illness, unspecified: Secondary | ICD-10-CM | POA: Diagnosis not present

## 2020-03-30 DIAGNOSIS — G20 Parkinson's disease: Secondary | ICD-10-CM | POA: Diagnosis not present

## 2020-03-30 DIAGNOSIS — M24561 Contracture, right knee: Secondary | ICD-10-CM | POA: Diagnosis not present

## 2020-03-30 DIAGNOSIS — R1312 Dysphagia, oropharyngeal phase: Secondary | ICD-10-CM | POA: Diagnosis not present

## 2020-04-02 DIAGNOSIS — R69 Illness, unspecified: Secondary | ICD-10-CM | POA: Diagnosis not present

## 2020-04-02 DIAGNOSIS — M6281 Muscle weakness (generalized): Secondary | ICD-10-CM | POA: Diagnosis not present

## 2020-04-02 DIAGNOSIS — M24561 Contracture, right knee: Secondary | ICD-10-CM | POA: Diagnosis not present

## 2020-04-02 DIAGNOSIS — R1312 Dysphagia, oropharyngeal phase: Secondary | ICD-10-CM | POA: Diagnosis not present

## 2020-04-02 DIAGNOSIS — I7091 Generalized atherosclerosis: Secondary | ICD-10-CM | POA: Diagnosis not present

## 2020-04-02 DIAGNOSIS — G20 Parkinson's disease: Secondary | ICD-10-CM | POA: Diagnosis not present

## 2020-04-03 DIAGNOSIS — M24561 Contracture, right knee: Secondary | ICD-10-CM | POA: Diagnosis not present

## 2020-04-03 DIAGNOSIS — M6281 Muscle weakness (generalized): Secondary | ICD-10-CM | POA: Diagnosis not present

## 2020-04-03 DIAGNOSIS — G20 Parkinson's disease: Secondary | ICD-10-CM | POA: Diagnosis not present

## 2020-04-03 DIAGNOSIS — R69 Illness, unspecified: Secondary | ICD-10-CM | POA: Diagnosis not present

## 2020-04-03 DIAGNOSIS — R1312 Dysphagia, oropharyngeal phase: Secondary | ICD-10-CM | POA: Diagnosis not present

## 2020-04-04 DIAGNOSIS — R1312 Dysphagia, oropharyngeal phase: Secondary | ICD-10-CM | POA: Diagnosis not present

## 2020-04-04 DIAGNOSIS — R69 Illness, unspecified: Secondary | ICD-10-CM | POA: Diagnosis not present

## 2020-04-04 DIAGNOSIS — M24561 Contracture, right knee: Secondary | ICD-10-CM | POA: Diagnosis not present

## 2020-04-04 DIAGNOSIS — M6281 Muscle weakness (generalized): Secondary | ICD-10-CM | POA: Diagnosis not present

## 2020-04-04 DIAGNOSIS — G20 Parkinson's disease: Secondary | ICD-10-CM | POA: Diagnosis not present

## 2020-04-05 DIAGNOSIS — R1312 Dysphagia, oropharyngeal phase: Secondary | ICD-10-CM | POA: Diagnosis not present

## 2020-04-05 DIAGNOSIS — M24561 Contracture, right knee: Secondary | ICD-10-CM | POA: Diagnosis not present

## 2020-04-05 DIAGNOSIS — R69 Illness, unspecified: Secondary | ICD-10-CM | POA: Diagnosis not present

## 2020-04-05 DIAGNOSIS — M6281 Muscle weakness (generalized): Secondary | ICD-10-CM | POA: Diagnosis not present

## 2020-04-05 DIAGNOSIS — G20 Parkinson's disease: Secondary | ICD-10-CM | POA: Diagnosis not present

## 2020-04-06 DIAGNOSIS — M24561 Contracture, right knee: Secondary | ICD-10-CM | POA: Diagnosis not present

## 2020-04-06 DIAGNOSIS — M6281 Muscle weakness (generalized): Secondary | ICD-10-CM | POA: Diagnosis not present

## 2020-04-06 DIAGNOSIS — G20 Parkinson's disease: Secondary | ICD-10-CM | POA: Diagnosis not present

## 2020-04-06 DIAGNOSIS — R69 Illness, unspecified: Secondary | ICD-10-CM | POA: Diagnosis not present

## 2020-04-06 DIAGNOSIS — R1312 Dysphagia, oropharyngeal phase: Secondary | ICD-10-CM | POA: Diagnosis not present

## 2020-04-09 DIAGNOSIS — G20 Parkinson's disease: Secondary | ICD-10-CM | POA: Diagnosis not present

## 2020-04-09 DIAGNOSIS — M24561 Contracture, right knee: Secondary | ICD-10-CM | POA: Diagnosis not present

## 2020-04-09 DIAGNOSIS — R69 Illness, unspecified: Secondary | ICD-10-CM | POA: Diagnosis not present

## 2020-04-09 DIAGNOSIS — M6281 Muscle weakness (generalized): Secondary | ICD-10-CM | POA: Diagnosis not present

## 2020-04-09 DIAGNOSIS — R1312 Dysphagia, oropharyngeal phase: Secondary | ICD-10-CM | POA: Diagnosis not present

## 2020-04-10 DIAGNOSIS — R1312 Dysphagia, oropharyngeal phase: Secondary | ICD-10-CM | POA: Diagnosis not present

## 2020-04-10 DIAGNOSIS — G20 Parkinson's disease: Secondary | ICD-10-CM | POA: Diagnosis not present

## 2020-04-10 DIAGNOSIS — R69 Illness, unspecified: Secondary | ICD-10-CM | POA: Diagnosis not present

## 2020-04-10 DIAGNOSIS — M24561 Contracture, right knee: Secondary | ICD-10-CM | POA: Diagnosis not present

## 2020-04-10 DIAGNOSIS — M6281 Muscle weakness (generalized): Secondary | ICD-10-CM | POA: Diagnosis not present

## 2020-04-11 DIAGNOSIS — M24561 Contracture, right knee: Secondary | ICD-10-CM | POA: Diagnosis not present

## 2020-04-11 DIAGNOSIS — R1312 Dysphagia, oropharyngeal phase: Secondary | ICD-10-CM | POA: Diagnosis not present

## 2020-04-11 DIAGNOSIS — G20 Parkinson's disease: Secondary | ICD-10-CM | POA: Diagnosis not present

## 2020-04-11 DIAGNOSIS — M6281 Muscle weakness (generalized): Secondary | ICD-10-CM | POA: Diagnosis not present

## 2020-04-11 DIAGNOSIS — R69 Illness, unspecified: Secondary | ICD-10-CM | POA: Diagnosis not present

## 2020-04-12 DIAGNOSIS — M24561 Contracture, right knee: Secondary | ICD-10-CM | POA: Diagnosis not present

## 2020-04-12 DIAGNOSIS — M6281 Muscle weakness (generalized): Secondary | ICD-10-CM | POA: Diagnosis not present

## 2020-04-12 DIAGNOSIS — R69 Illness, unspecified: Secondary | ICD-10-CM | POA: Diagnosis not present

## 2020-04-12 DIAGNOSIS — H01004 Unspecified blepharitis left upper eyelid: Secondary | ICD-10-CM | POA: Diagnosis not present

## 2020-04-12 DIAGNOSIS — H01002 Unspecified blepharitis right lower eyelid: Secondary | ICD-10-CM | POA: Diagnosis not present

## 2020-04-12 DIAGNOSIS — H01001 Unspecified blepharitis right upper eyelid: Secondary | ICD-10-CM | POA: Diagnosis not present

## 2020-04-12 DIAGNOSIS — H01005 Unspecified blepharitis left lower eyelid: Secondary | ICD-10-CM | POA: Diagnosis not present

## 2020-04-12 DIAGNOSIS — R1312 Dysphagia, oropharyngeal phase: Secondary | ICD-10-CM | POA: Diagnosis not present

## 2020-04-12 DIAGNOSIS — G20 Parkinson's disease: Secondary | ICD-10-CM | POA: Diagnosis not present

## 2020-04-13 DIAGNOSIS — M24561 Contracture, right knee: Secondary | ICD-10-CM | POA: Diagnosis not present

## 2020-04-13 DIAGNOSIS — M6281 Muscle weakness (generalized): Secondary | ICD-10-CM | POA: Diagnosis not present

## 2020-04-13 DIAGNOSIS — R1312 Dysphagia, oropharyngeal phase: Secondary | ICD-10-CM | POA: Diagnosis not present

## 2020-04-13 DIAGNOSIS — R69 Illness, unspecified: Secondary | ICD-10-CM | POA: Diagnosis not present

## 2020-04-13 DIAGNOSIS — G20 Parkinson's disease: Secondary | ICD-10-CM | POA: Diagnosis not present

## 2020-04-16 DIAGNOSIS — G20 Parkinson's disease: Secondary | ICD-10-CM | POA: Diagnosis not present

## 2020-04-16 DIAGNOSIS — R1312 Dysphagia, oropharyngeal phase: Secondary | ICD-10-CM | POA: Diagnosis not present

## 2020-04-16 DIAGNOSIS — R69 Illness, unspecified: Secondary | ICD-10-CM | POA: Diagnosis not present

## 2020-04-16 DIAGNOSIS — M24561 Contracture, right knee: Secondary | ICD-10-CM | POA: Diagnosis not present

## 2020-04-16 DIAGNOSIS — M6281 Muscle weakness (generalized): Secondary | ICD-10-CM | POA: Diagnosis not present

## 2020-04-17 DIAGNOSIS — M6281 Muscle weakness (generalized): Secondary | ICD-10-CM | POA: Diagnosis not present

## 2020-04-17 DIAGNOSIS — R1312 Dysphagia, oropharyngeal phase: Secondary | ICD-10-CM | POA: Diagnosis not present

## 2020-04-17 DIAGNOSIS — R69 Illness, unspecified: Secondary | ICD-10-CM | POA: Diagnosis not present

## 2020-04-17 DIAGNOSIS — M24561 Contracture, right knee: Secondary | ICD-10-CM | POA: Diagnosis not present

## 2020-04-17 DIAGNOSIS — G20 Parkinson's disease: Secondary | ICD-10-CM | POA: Diagnosis not present

## 2020-04-18 DIAGNOSIS — R1312 Dysphagia, oropharyngeal phase: Secondary | ICD-10-CM | POA: Diagnosis not present

## 2020-04-18 DIAGNOSIS — M6281 Muscle weakness (generalized): Secondary | ICD-10-CM | POA: Diagnosis not present

## 2020-04-18 DIAGNOSIS — M24561 Contracture, right knee: Secondary | ICD-10-CM | POA: Diagnosis not present

## 2020-04-18 DIAGNOSIS — G20 Parkinson's disease: Secondary | ICD-10-CM | POA: Diagnosis not present

## 2020-04-18 DIAGNOSIS — R69 Illness, unspecified: Secondary | ICD-10-CM | POA: Diagnosis not present

## 2020-05-01 DIAGNOSIS — H5789 Other specified disorders of eye and adnexa: Secondary | ICD-10-CM | POA: Diagnosis not present

## 2020-05-02 DIAGNOSIS — M24561 Contracture, right knee: Secondary | ICD-10-CM | POA: Diagnosis not present

## 2020-05-02 DIAGNOSIS — I6990 Unspecified sequelae of unspecified cerebrovascular disease: Secondary | ICD-10-CM | POA: Diagnosis not present

## 2020-05-02 DIAGNOSIS — M6281 Muscle weakness (generalized): Secondary | ICD-10-CM | POA: Diagnosis not present

## 2020-05-02 DIAGNOSIS — I10 Essential (primary) hypertension: Secondary | ICD-10-CM | POA: Diagnosis not present

## 2020-05-02 DIAGNOSIS — I5042 Chronic combined systolic (congestive) and diastolic (congestive) heart failure: Secondary | ICD-10-CM | POA: Diagnosis not present

## 2020-05-02 DIAGNOSIS — I69328 Other speech and language deficits following cerebral infarction: Secondary | ICD-10-CM | POA: Diagnosis not present

## 2020-05-02 DIAGNOSIS — G20 Parkinson's disease: Secondary | ICD-10-CM | POA: Diagnosis not present

## 2020-05-02 DIAGNOSIS — R627 Adult failure to thrive: Secondary | ICD-10-CM | POA: Diagnosis not present

## 2020-05-02 DIAGNOSIS — R69 Illness, unspecified: Secondary | ICD-10-CM | POA: Diagnosis not present

## 2020-05-02 DIAGNOSIS — I69391 Dysphagia following cerebral infarction: Secondary | ICD-10-CM | POA: Diagnosis not present

## 2020-05-02 DIAGNOSIS — R1312 Dysphagia, oropharyngeal phase: Secondary | ICD-10-CM | POA: Diagnosis not present

## 2020-05-30 DEATH — deceased

## 2024-04-26 ENCOUNTER — Other Ambulatory Visit
